# Patient Record
Sex: Female | Born: 1980 | Race: Black or African American | Hispanic: No | Marital: Single | Smoking: Former smoker
Health system: Southern US, Community
[De-identification: ages and names within clinical notes are randomized; demographics above are authoritative.]

## PROBLEM LIST (undated history)

## (undated) ENCOUNTER — Inpatient Hospital Stay: Payer: Self-pay

## (undated) DIAGNOSIS — R011 Cardiac murmur, unspecified: Secondary | ICD-10-CM

## (undated) DIAGNOSIS — R51 Headache: Secondary | ICD-10-CM

## (undated) DIAGNOSIS — R519 Headache, unspecified: Secondary | ICD-10-CM

## (undated) DIAGNOSIS — E042 Nontoxic multinodular goiter: Secondary | ICD-10-CM

## (undated) DIAGNOSIS — F329 Major depressive disorder, single episode, unspecified: Secondary | ICD-10-CM

## (undated) DIAGNOSIS — F32A Depression, unspecified: Secondary | ICD-10-CM

## (undated) DIAGNOSIS — E041 Nontoxic single thyroid nodule: Secondary | ICD-10-CM

## (undated) HISTORY — DX: Cardiac murmur, unspecified: R01.1

## (undated) HISTORY — DX: Depression, unspecified: F32.A

## (undated) HISTORY — DX: Major depressive disorder, single episode, unspecified: F32.9

## (undated) HISTORY — DX: Nontoxic single thyroid nodule: E04.1

---
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
## 2002-12-25 ENCOUNTER — Other Ambulatory Visit: Admission: RE | Admit: 2002-12-25 | Discharge: 2002-12-25 | Payer: Self-pay | Admitting: Obstetrics and Gynecology

## 2003-02-23 ENCOUNTER — Inpatient Hospital Stay (HOSPITAL_COMMUNITY): Admission: AD | Admit: 2003-02-23 | Discharge: 2003-02-24 | Payer: Self-pay | Admitting: Obstetrics and Gynecology

## 2003-03-21 ENCOUNTER — Observation Stay (HOSPITAL_COMMUNITY): Admission: AD | Admit: 2003-03-21 | Discharge: 2003-03-22 | Payer: Self-pay | Admitting: Obstetrics and Gynecology

## 2003-04-23 ENCOUNTER — Inpatient Hospital Stay (HOSPITAL_COMMUNITY): Admission: AD | Admit: 2003-04-23 | Discharge: 2003-04-23 | Payer: Self-pay | Admitting: Obstetrics and Gynecology

## 2003-05-21 ENCOUNTER — Inpatient Hospital Stay (HOSPITAL_COMMUNITY): Admission: AD | Admit: 2003-05-21 | Discharge: 2003-05-24 | Payer: Self-pay | Admitting: Obstetrics and Gynecology

## 2003-05-26 ENCOUNTER — Inpatient Hospital Stay (HOSPITAL_COMMUNITY): Admission: AD | Admit: 2003-05-26 | Discharge: 2003-05-31 | Payer: Self-pay | Admitting: Obstetrics and Gynecology

## 2003-12-27 ENCOUNTER — Other Ambulatory Visit: Admission: RE | Admit: 2003-12-27 | Discharge: 2003-12-27 | Payer: Self-pay | Admitting: Obstetrics and Gynecology

## 2005-03-03 ENCOUNTER — Other Ambulatory Visit: Admission: RE | Admit: 2005-03-03 | Discharge: 2005-03-03 | Payer: Self-pay | Admitting: Obstetrics and Gynecology

## 2005-10-06 ENCOUNTER — Emergency Department (HOSPITAL_COMMUNITY): Admission: EM | Admit: 2005-10-06 | Discharge: 2005-10-06 | Payer: Self-pay | Admitting: Emergency Medicine

## 2006-01-10 ENCOUNTER — Emergency Department (HOSPITAL_COMMUNITY): Admission: EM | Admit: 2006-01-10 | Discharge: 2006-01-11 | Payer: Self-pay | Admitting: Emergency Medicine

## 2009-07-06 ENCOUNTER — Emergency Department: Payer: Self-pay | Admitting: Unknown Physician Specialty

## 2010-04-22 ENCOUNTER — Emergency Department: Payer: Self-pay | Admitting: Emergency Medicine

## 2011-07-29 ENCOUNTER — Emergency Department: Payer: Self-pay | Admitting: Emergency Medicine

## 2011-07-29 LAB — COMPREHENSIVE METABOLIC PANEL
Albumin: 3.9 g/dL (ref 3.4–5.0)
BUN: 9 mg/dL (ref 7–18)
Bilirubin,Total: 0.4 mg/dL (ref 0.2–1.0)
Calcium, Total: 8.9 mg/dL (ref 8.5–10.1)
Co2: 26 mmol/L (ref 21–32)
EGFR (African American): >60
Glucose: 93 mg/dL (ref 65–99)
Potassium: 4 mmol/L (ref 3.5–5.1)
SGOT(AST): 17 U/L (ref 15–37)
SGPT (ALT): 21 U/L
Total Protein: 8.1 g/dL (ref 6.4–8.2)

## 2011-07-29 LAB — URINALYSIS, COMPLETE
Bilirubin,UR: NEGATIVE
Ketone: NEGATIVE
Leukocyte Esterase: NEGATIVE
Nitrite: NEGATIVE
Protein: NEGATIVE
Specific Gravity: 1.018 (ref 1.003–1.030)

## 2011-07-29 LAB — WET PREP, GENITAL

## 2011-07-29 LAB — CBC
HCT: 36.9 % (ref 35.0–47.0)
HGB: 11.7 g/dL — ABNORMAL LOW (ref 12.0–16.0)
RBC: 4.83 10*6/uL (ref 3.80–5.20)
RDW: 15.9 % — ABNORMAL HIGH (ref 11.5–14.5)

## 2012-01-04 ENCOUNTER — Other Ambulatory Visit (INDEPENDENT_AMBULATORY_CARE_PROVIDER_SITE_OTHER): Payer: Self-pay

## 2012-01-04 ENCOUNTER — Ambulatory Visit (INDEPENDENT_AMBULATORY_CARE_PROVIDER_SITE_OTHER): Payer: Self-pay | Admitting: Endocrinology

## 2012-01-04 ENCOUNTER — Encounter: Payer: Self-pay | Admitting: Endocrinology

## 2012-01-04 VITALS — BP 130/82 | HR 67 | Temp 98.5°F | Ht 63.0 in | Wt 151.0 lb

## 2012-01-04 DIAGNOSIS — E042 Nontoxic multinodular goiter: Secondary | ICD-10-CM

## 2012-01-04 NOTE — Patient Instructions (Addendum)
blood tests are being requested for you today.  You will receive a letter with results. If the blood test is overactive, i'll request for you a special x-ray to be done at [facility] [address] If it is not overactive, i'll request for you a biopsy, also at [facility]. (update: i ordered us-guided bx)
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
## 2012-01-05 ENCOUNTER — Encounter: Payer: Self-pay | Admitting: Endocrinology

## 2012-01-05 DIAGNOSIS — F329 Major depressive disorder, single episode, unspecified: Secondary | ICD-10-CM | POA: Insufficient documentation

## 2012-01-05 DIAGNOSIS — O4402 Placenta previa specified as without hemorrhage, second trimester: Secondary | ICD-10-CM | POA: Insufficient documentation

## 2013-05-28 ENCOUNTER — Other Ambulatory Visit: Payer: Self-pay | Admitting: Otolaryngology

## 2013-05-28 DIAGNOSIS — E041 Nontoxic single thyroid nodule: Secondary | ICD-10-CM

## 2013-06-06 ENCOUNTER — Ambulatory Visit
Admission: RE | Admit: 2013-06-06 | Discharge: 2013-06-06 | Disposition: A | Discharge status: Home / Self Care | Payer: BC Managed Care – PPO | Source: Ambulatory Visit | Attending: Otolaryngology | Admitting: Otolaryngology

## 2013-06-06 ENCOUNTER — Other Ambulatory Visit (HOSPITAL_COMMUNITY)
Admission: RE | Admit: 2013-06-06 | Discharge: 2013-06-06 | Disposition: A | Discharge status: Home / Self Care | Payer: BC Managed Care – PPO | Source: Ambulatory Visit | Attending: Interventional Radiology | Admitting: Interventional Radiology

## 2013-06-06 DIAGNOSIS — E041 Nontoxic single thyroid nodule: Secondary | ICD-10-CM

## 2013-11-13 DIAGNOSIS — E041 Nontoxic single thyroid nodule: Secondary | ICD-10-CM | POA: Insufficient documentation

## 2014-12-07 ENCOUNTER — Emergency Department
Admission: EM | Admit: 2014-12-07 | Discharge: 2014-12-07 | Disposition: A | Discharge status: Home / Self Care | Payer: 59 | Attending: Emergency Medicine | Admitting: Emergency Medicine

## 2014-12-07 ENCOUNTER — Emergency Department: Payer: 59

## 2014-12-07 DIAGNOSIS — Z720 Tobacco use: Secondary | ICD-10-CM | POA: Diagnosis not present

## 2014-12-07 DIAGNOSIS — Z3202 Encounter for pregnancy test, result negative: Secondary | ICD-10-CM | POA: Diagnosis not present

## 2014-12-07 DIAGNOSIS — R1032 Left lower quadrant pain: Secondary | ICD-10-CM

## 2014-12-07 DIAGNOSIS — R112 Nausea with vomiting, unspecified: Secondary | ICD-10-CM | POA: Diagnosis not present

## 2014-12-07 LAB — LIPASE, BLOOD: LIPASE: 14 U/L — AB (ref 22–51)

## 2014-12-07 LAB — URINALYSIS COMPLETE WITH MICROSCOPIC (ARMC ONLY)
BACTERIA UA: NONE SEEN
Bilirubin Urine: NEGATIVE
GLUCOSE, UA: NEGATIVE mg/dL
HGB URINE DIPSTICK: NEGATIVE
Ketones, ur: NEGATIVE mg/dL
Leukocytes, UA: NEGATIVE
NITRITE: NEGATIVE
PROTEIN: NEGATIVE mg/dL
SPECIFIC GRAVITY, URINE: 1.02 (ref 1.005–1.030)
pH: 6 (ref 5.0–8.0)

## 2014-12-07 LAB — COMPREHENSIVE METABOLIC PANEL
ALBUMIN: 4.3 g/dL (ref 3.5–5.0)
ALT: 13 U/L — ABNORMAL LOW (ref 14–54)
ANION GAP: 7 (ref 5–15)
AST: 21 U/L (ref 15–41)
Alkaline Phosphatase: 71 U/L (ref 38–126)
BUN: 8 mg/dL (ref 6–20)
CHLORIDE: 105 mmol/L (ref 101–111)
CO2: 26 mmol/L (ref 22–32)
Calcium: 8.9 mg/dL (ref 8.9–10.3)
Creatinine, Ser: 0.89 mg/dL (ref 0.44–1.00)
GFR calc Af Amer: >60 mL/min (ref >60)
GFR calc non Af Amer: >60 mL/min (ref >60)
GLUCOSE: 124 mg/dL — AB (ref 65–99)
POTASSIUM: 4 mmol/L (ref 3.5–5.1)
SODIUM: 138 mmol/L (ref 135–145)
TOTAL PROTEIN: 7.9 g/dL (ref 6.5–8.1)
Total Bilirubin: 0.6 mg/dL (ref 0.3–1.2)

## 2014-12-07 LAB — CBC
HEMATOCRIT: 40.2 % (ref 35.0–47.0)
HEMOGLOBIN: 12.5 g/dL (ref 12.0–16.0)
MCH: 23.5 pg — ABNORMAL LOW (ref 26.0–34.0)
MCHC: 31.2 g/dL — AB (ref 32.0–36.0)
MCV: 75.3 fL — AB (ref 80.0–100.0)
Platelets: 167 10*3/uL (ref 150–440)
RBC: 5.34 MIL/uL — ABNORMAL HIGH (ref 3.80–5.20)
RDW: 14.8 % — AB (ref 11.5–14.5)
WBC: 5.2 10*3/uL (ref 3.6–11.0)

## 2014-12-07 LAB — POCT PREGNANCY, URINE: PREG TEST UR: NEGATIVE

## 2014-12-07 MED ORDER — SODIUM CHLORIDE 0.9 % IV BOLUS (SEPSIS)
500.0000 mL | INTRAVENOUS | Status: AC
  Administered 2014-12-07: 500 mL via INTRAVENOUS

## 2014-12-07 MED ORDER — ONDANSETRON 4 MG PO TBDP
ORAL_TABLET | ORAL | Status: AC
  Filled 2014-12-07: qty 1

## 2014-12-07 MED ORDER — MORPHINE SULFATE (PF) 4 MG/ML IV SOLN
4.0000 mg | Freq: Once | INTRAVENOUS | Status: AC
  Administered 2014-12-07: 4 mg via INTRAVENOUS
  Filled 2014-12-07: qty 1

## 2014-12-07 MED ORDER — ONDANSETRON HCL 4 MG PO TABS: ORAL_TABLET | ORAL | Status: DC

## 2014-12-07 MED ORDER — IOHEXOL 240 MG/ML SOLN
25.0000 mL | Freq: Once | INTRAMUSCULAR | Status: AC | PRN
  Administered 2014-12-07: 25 mL via ORAL

## 2014-12-07 MED ORDER — ONDANSETRON HCL 4 MG/2ML IJ SOLN
4.0000 mg | INTRAMUSCULAR | Status: AC
  Administered 2014-12-07: 4 mg via INTRAVENOUS
  Filled 2014-12-07: qty 2

## 2014-12-07 MED ORDER — IOHEXOL 300 MG/ML  SOLN
100.0000 mL | Freq: Once | INTRAMUSCULAR | Status: AC | PRN
  Administered 2014-12-07: 100 mL via INTRAVENOUS

## 2014-12-07 MED ORDER — ONDANSETRON 4 MG PO TBDP
4.0000 mg | ORAL_TABLET | Freq: Once | ORAL | Status: AC | PRN
  Administered 2014-12-07: 4 mg via ORAL

## 2014-12-07 NOTE — ED Provider Notes (Signed)
[facility] Emergency Department Provider Note  ____________________________________________  Time seen: Approximately 5:36 PM  I have reviewed the triage vital signs and the nursing notes.   HISTORY  Chief Complaint Abdominal Pain    HPI Rhylie Stehr is a 34 y.o. female with no significant past medical history who presents with left lower quadrant pain that extends down into her left groin.  She describes the pain as acute in onset and has been present for 2 days, constant but waxing and waning, accompanied by severe vomiting today (at least 10 episodes).  She has not had any bowel movements today and does not remember passing any flatus.  She states that the pain is severe at its worst but is currently mild at this time.  She has had similar symptoms in the past but states that it is more mild than this. she denies fever/chills, chest pain, shortness of breath.  She has a Mirena IUD and has intermittent spotting and irregular times, but currently denies any vaginal pain, vaginal bleeding, or vaginal discharge.   Past Medical History  Diagnosis Date  . Thyroid nodule   . Heart murmur   . Depression     Patient Active Problem List   Diagnosis Date Noted  . Heart murmur   . Depression   . Multinodular goiter 01/04/2012    History reviewed. No pertinent past surgical history.  Current Outpatient Rx  Name  Route  Sig  Dispense  Refill  . ondansetron (ZOFRAN) 4 MG tablet      Take 1-2 tabs by mouth every 8 hours as needed for nausea/vomiting   30 tablet   0   . oxyCODONE-acetaminophen (PERCOCET) 7.5-325 MG per tablet   Oral   Take 1 tablet by mouth as needed.         . promethazine (PHENERGAN) 25 MG tablet   Oral   Take 25 mg by mouth as needed.           Allergies Review of patient's allergies indicates no known allergies.  Family History  Problem Relation Age of Onset  . Hypertension Other     Grandparents  . Diabetes Other      Grandparents    Social History Social History  Substance Use Topics  . Smoking status: Current Every Day Smoker -- 0.30 packs/day    Types: Cigarettes  . Smokeless tobacco: None  . Alcohol Use: Yes    Review of Systems Constitutional: No fever/chills Eyes: No visual changes. ENT: No sore throat. Cardiovascular: Denies chest pain. Respiratory: Denies shortness of breath. Gastrointestinal: left lower quadrant abdominal pain with severe nausea and vomiting.  No diarrhea.  No constipation. Genitourinary: Negative for dysuria. Musculoskeletal: Negative for back pain. Skin: Negative for rash. Neurological: Negative for headaches, focal weakness or numbness.  10-point ROS otherwise negative.  ____________________________________________   PHYSICAL EXAM:  VITAL SIGNS: ED Triage Vitals  Enc Vitals Group     BP 12/07/14 1324 160/83 mmHg     Pulse Rate 12/07/14 1324 71     Resp --      Temp 12/07/14 1324 97.7 F (36.5 C)     Temp Source 12/07/14 1324 Oral     SpO2 12/07/14 1324 99 %     Weight 12/07/14 1324 160 lb (72.576 kg)     Height 12/07/14 1324  (1.6 m)     Head Cir --      Peak Flow --      Pain Score 12/07/14 1324  10     Pain Loc --      Pain Edu? --      Excl. in GC? --     Constitutional: Alert and oriented. Well appearing and in no acute distress it appears mildly uncomfortable. Eyes: Conjunctivae are normal. PERRL. EOMI. Head: Atraumatic. Nose: No congestion/rhinnorhea. Mouth/Throat: Mucous membranes are moist.  Oropharynx non-erythematous. Neck: No stridor.   Cardiovascular: Normal rate, regular rhythm. Grossly normal heart sounds.  Good peripheral circulation. Respiratory: Normal respiratory effort.  No retractions. Lungs CTAB. Gastrointestinal: Soft with mild to moderate tenderness to palpation of the left lower quadrant. No distention. No abdominal bruits. No CVA tenderness. no McBurney's tenderness. No guarding or rebound Genitourinary:   DEFERRED AT THE PATIENT'S REQUEST Musculoskeletal: No lower extremity tenderness nor edema.  No joint effusions. Neurologic:  Normal speech and language. No gross focal neurologic deficits are appreciated.  Skin:  Skin is warm, dry and intact. No rash noted. Psychiatric: Mood and affect are normal. Speech and behavior are normal.  ____________________________________________   LABS (all labs ordered are listed, but only abnormal results are displayed)  Labs Reviewed  LIPASE, BLOOD - Abnormal; Notable for the following:    Lipase 14 (*)    All other components within normal limits  COMPREHENSIVE METABOLIC PANEL - Abnormal; Notable for the following:    Glucose, Bld 124 (*)    ALT 13 (*)    All other components within normal limits  CBC - Abnormal; Notable for the following:    RBC 5.34 (*)    MCV 75.3 (*)    MCH 23.5 (*)    MCHC 31.2 (*)    RDW 14.8 (*)    All other components within normal limits  URINALYSIS COMPLETEWITH MICROSCOPIC ([facility] ONLY) - Abnormal; Notable for the following:    Color, Urine YELLOW (*)    APPearance CLEAR (*)    Squamous Epithelial / LPF 0-5 (*)    All other components within normal limits  POC URINE PREG, ED  POCT PREGNANCY, URINE   ____________________________________________  EKG  Not indicated ____________________________________________  RADIOLOGY   Ct Abdomen Pelvis W Contrast  12/07/2014   CLINICAL DATA:  Left lower quadrant abdominal pain and left-sided pelvic pain over the past 2 days associated with chills, nausea and vomiting.  EXAM: CT ABDOMEN AND PELVIS WITH CONTRAST  TECHNIQUE: Multidetector CT imaging of the abdomen and pelvis was performed using the standard protocol following bolus administration of intravenous contrast.  CONTRAST:  OMNIPAQUE IOHEXOL 300 MG/ML IV. Oral contrast was also administered.  COMPARISON:  01/03/2013, 07/29/2011.  FINDINGS: Hepatobiliary: Liver normal in size and appearance. Normal-appearing  gallbladder without calcified gallstones. No biliary ductal dilation.  Spleen:  Normal in size and appearance.  Pancreas:  Normal in appearance.  No pancreatic ductal dilation.  Adrenal glands:  Normal in appearance.  Genitourinary: Both kidneys normal in size and appearance. No evidence of urinary tract calculi. Normal-appearing urinary bladder.  Normal-appearing uterus. Intrauterine device appropriately positioned in the fundal endometrium. Collapsing functional cyst in the right ovary with an enhancing wall measuring approximately 2 cm. No dominant cyst in either ovary. Minimal free fluid in the right adnexum. No free fluid elsewhere in the pelvis.  Gastrointestinal: Normal appearing stomach. Apparent wall thickening involving the gastric antrum likely represents peristalsis/muscle contraction, as there is no evidence of gastric distention to suggest outflow obstruction. Normal-appearing small bowel. Normal appearing colon with expected stool burden. Normal appearing gas-filled appendix in the right mid pelvis.  Ascites:  Absent.  Vascular: No visible aortoiliofemoral atherosclerosis. Retroaortic left renal vein.  Lymphatic:  No pathologic lymphadenopathy in the abdomen or pelvis.  Other findings: None.  Musculoskeletal: Regional skeleton intact.  Visualized lower thorax: Heart size normal.  Lung bases clear.  IMPRESSION: 1. 2 cm collapsing functional cyst in the right ovary with minimal free fluid in the right adnexum. 2. Otherwise, no acute or significant abnormalities involving the abdomen or pelvis.   Electronically Signed   By: Hulan Saas M.D.   On: 12/07/2014 18:44    ____________________________________________   PROCEDURES  Procedure(s) performed: None  Critical Care performed: No ____________________________________________   INITIAL IMPRESSION / ASSESSMENT AND PLAN / ED COURSE  Pertinent labs & imaging results that were available during my care of the patient were reviewed by me  and considered in my medical decision making (see chart for details).  The patient is relatively well appearing and has normal vital signs and essentially normal labs.  She does have severe tenderness to palpation of the left lower quadrant and I am concerned about the numerous episodes of vomiting and her report of not passing flatus or having a bowel movement as well.  Given my concern for her vomiting and tenderness to palpation, I will evaluate with a CT scan with by mouth and IV contrast.  I will likely also need for a pelvic exam  but will defer first to the imaging.  ----------------------------------------- 8:03 PM on 12/07/2014 -----------------------------------------  The patient's CT scan was unremarkable.  I just went to reassess her and she states that she feels much better, is hungry, just wants to go home.  I explained that we normally would do a pelvic exam, but she states that she does not want to have one today and that she feels fine and is not having any symptoms "down there".  I understand her preference and think this is acceptable at this time.he also told me that she does have this pain in the past when she is "feeling stressed" in her grandmother's in the hospital right now she thinks this may have something to do with it.  She has been followed prescribe her some anxiety medication but I deferred and recommended that she follow-up with her regular doctor.  I advocated the use of over-the-counter pain medicine at this time.  I gave her my usual and customary return precautions.  ____________________________________________  FINAL CLINICAL IMPRESSION(S) / ED DIAGNOSES  Final diagnoses:  LLQ abdominal pain      NEW MEDICATIONS STARTED DURING THIS VISIT:  New Prescriptions   ONDANSETRON (ZOFRAN) 4 MG TABLET    Take 1-2 tabs by mouth every 8 hours as needed for nausea/vomiting     Loleta Rose, MD 12/07/14 2006

## 2014-12-07 NOTE — ED Notes (Signed)
Pt c/o LLQ pain/pelvic pain for the past 2 days with N/V.Marland Kitchendenies diarrhea or discharge.Marland Kitchen

## 2014-12-07 NOTE — Discharge Instructions (Signed)
You have been seen in the Emergency Department (ED) for abdominal pain.  Your evaluation did not identify a clear cause of your symptoms but was generally reassuring.  Please follow up as instructed above regarding todays emergent visit and the symptoms that are bothering you.  Return to the ED if your abdominal pain worsens or fails to improve, you develop bloody vomiting, bloody diarrhea, you are unable to tolerate fluids due to vomiting, fever greater than 101, or other symptoms that concern you.   Abdominal Pain Many things can cause abdominal pain. Usually, abdominal pain is not caused by a disease and will improve without treatment. It can often be observed and treated at home. Your health care provider will do a physical exam and possibly order blood tests and X-rays to help determine the seriousness of your pain. However, in many cases, more time must pass before a clear cause of the pain can be found. Before that point, your health care provider may not know if you need more testing or further treatment. HOME CARE INSTRUCTIONS  Monitor your abdominal pain for any changes. The following actions may help to alleviate any discomfort you are experiencing:  Only take over-the-counter or prescription medicines as directed by your health care provider.  Do not take laxatives unless directed to do so by your health care provider.  Try a clear liquid diet (broth, tea, or water) as directed by your health care provider. Slowly move to a bland diet as tolerated. SEEK MEDICAL CARE IF:  You have unexplained abdominal pain.  You have abdominal pain associated with nausea or diarrhea.  You have pain when you urinate or have a bowel movement.  You experience abdominal pain that wakes you in the night.  You have abdominal pain that is worsened or improved by eating food.  You have abdominal pain that is worsened with eating fatty foods.  You have a fever. SEEK IMMEDIATE MEDICAL CARE IF:    Your pain does not go away within 2 hours.  You keep throwing up (vomiting).  Your pain is felt only in portions of the abdomen, such as the right side or the left lower portion of the abdomen.  You pass bloody or black tarry stools. MAKE SURE YOU:  Understand these instructions.   Will watch your condition.   Will get help right away if you are not doing well or get worse.  Document Released: 01/13/2005 Document Revised: 04/10/2013 Document Reviewed: 12/13/2012 [facility] Patient Information 2015 [place], [place]. This information is not intended to replace advice given to you by your health care provider. Make sure you discuss any questions you have with your health care provider.  Abdominal Pain, Women Abdominal (stomach, pelvic, or belly) pain can be caused by many things. It is important to tell your doctor:  The location of the pain.  Does it come and go or is it present all the time?  Are there things that start the pain (eating certain foods, exercise)?  Are there other symptoms associated with the pain (fever, nausea, vomiting, diarrhea)? All of this is helpful to know when trying to find the cause of the pain. CAUSES   Stomach: virus or bacteria infection, or ulcer.  Intestine: appendicitis (inflamed appendix), regional ileitis (Crohn's disease), ulcerative colitis (inflamed colon), irritable bowel syndrome, diverticulitis (inflamed diverticulum of the colon), or cancer of the stomach or intestine.  Gallbladder disease or stones in the gallbladder.  Kidney disease, kidney stones, or infection.  Pancreas infection or cancer.  Fibromyalgia (pain disorder).  Diseases of the female organs:  Uterus: fibroid (non-cancerous) tumors or infection.  Fallopian tubes: infection or tubal pregnancy.  Ovary: cysts or tumors.  Pelvic adhesions (scar tissue).  Endometriosis (uterus lining tissue growing in the pelvis and on the pelvic organs).  Pelvic congestion  syndrome (female organs filling up with blood just before the menstrual period).  Pain with the menstrual period.  Pain with ovulation (producing an egg).  Pain with an IUD (intrauterine device, birth control) in the uterus.  Cancer of the female organs.  Functional pain (pain not caused by a disease, may improve without treatment).  Psychological pain.  Depression. DIAGNOSIS  Your doctor will decide the seriousness of your pain by doing an examination.  Blood tests.  X-rays.  Ultrasound.  CT scan (computed tomography, special type of X-ray).  MRI (magnetic resonance imaging).  Cultures, for infection.  Barium enema (dye inserted in the large intestine, to better view it with X-rays).  Colonoscopy (looking in intestine with a lighted tube).  Laparoscopy (minor surgery, looking in abdomen with a lighted tube).  Major abdominal exploratory surgery (looking in abdomen with a large incision). TREATMENT  The treatment will depend on the cause of the pain.   Many cases can be observed and treated at home.  Over-the-counter medicines recommended by your caregiver.  Prescription medicine.  Antibiotics, for infection.  Birth control pills, for painful periods or for ovulation pain.  Hormone treatment, for endometriosis.  Nerve blocking injections.  Physical therapy.  Antidepressants.  Counseling with a psychologist or psychiatrist.  Minor or major surgery. HOME CARE INSTRUCTIONS   Do not take laxatives, unless directed by your caregiver.  Take over-the-counter pain medicine only if ordered by your caregiver. Do not take aspirin because it can cause an upset stomach or bleeding.  Try a clear liquid diet (broth or water) as ordered by your caregiver. Slowly move to a bland diet, as tolerated, if the pain is related to the stomach or intestine.  Have a thermometer and take your temperature several times a day, and record it.  Bed rest and sleep, if it helps  the pain.  Avoid sexual intercourse, if it causes pain.  Avoid stressful situations.  Keep your follow-up appointments and tests, as your caregiver orders.  If the pain does not go away with medicine or surgery, you may try:  Acupuncture.  Relaxation exercises (yoga, meditation).  Group therapy.  Counseling. SEEK MEDICAL CARE IF:   You notice certain foods cause stomach pain.  Your home care treatment is not helping your pain.  You need stronger pain medicine.  You want your IUD removed.  You feel faint or lightheaded.  You develop nausea and vomiting.  You develop a rash.  You are having side effects or an allergy to your medicine. SEEK IMMEDIATE MEDICAL CARE IF:   Your pain does not go away or gets worse.  You have a fever.  Your pain is felt only in portions of the abdomen. The right side could possibly be appendicitis. The left lower portion of the abdomen could be colitis or diverticulitis.  You are passing blood in your stools (bright red or black tarry stools, with or without vomiting).  You have blood in your urine.  You develop chills, with or without a fever.  You pass out. MAKE SURE YOU:   Understand these instructions.  Will watch your condition.  Will get help right away if you are not doing well or get worse.  Document Released: 01/31/2007 Document Revised: 08/20/2013 Document Reviewed: 02/20/2009 [facility] Patient Information 2015 [place], [place]. This information is not intended to replace advice given to you by your health care provider. Make sure you discuss any questions you have with your health care provider.

## 2017-07-07 ENCOUNTER — Emergency Department
Admission: EM | Admit: 2017-07-07 | Discharge: 2017-07-07 | Disposition: A | Discharge status: Home / Self Care | Payer: Medicaid Other | Attending: Emergency Medicine | Admitting: Emergency Medicine

## 2017-07-07 ENCOUNTER — Encounter: Payer: Self-pay | Admitting: Emergency Medicine

## 2017-07-07 DIAGNOSIS — O99331 Smoking (tobacco) complicating pregnancy, first trimester: Secondary | ICD-10-CM | POA: Insufficient documentation

## 2017-07-07 DIAGNOSIS — O219 Vomiting of pregnancy, unspecified: Secondary | ICD-10-CM | POA: Insufficient documentation

## 2017-07-07 DIAGNOSIS — F17210 Nicotine dependence, cigarettes, uncomplicated: Secondary | ICD-10-CM | POA: Insufficient documentation

## 2017-07-07 DIAGNOSIS — O9989 Other specified diseases and conditions complicating pregnancy, childbirth and the puerperium: Secondary | ICD-10-CM | POA: Diagnosis not present

## 2017-07-07 DIAGNOSIS — Z3A10 10 weeks gestation of pregnancy: Secondary | ICD-10-CM | POA: Insufficient documentation

## 2017-07-07 DIAGNOSIS — M791 Myalgia, unspecified site: Secondary | ICD-10-CM | POA: Diagnosis not present

## 2017-07-07 LAB — URINALYSIS, COMPLETE (UACMP) WITH MICROSCOPIC
BILIRUBIN URINE: NEGATIVE
Bacteria, UA: NONE SEEN
GLUCOSE, UA: NEGATIVE mg/dL
HGB URINE DIPSTICK: NEGATIVE
Ketones, ur: NEGATIVE mg/dL
NITRITE: NEGATIVE
PH: 6 (ref 5.0–8.0)
Protein, ur: NEGATIVE mg/dL
SPECIFIC GRAVITY, URINE: 1.006 (ref 1.005–1.030)

## 2017-07-07 LAB — COMPREHENSIVE METABOLIC PANEL
ALT: 38 U/L (ref 14–54)
ANION GAP: 9 (ref 5–15)
AST: 40 U/L (ref 15–41)
Albumin: 3.7 g/dL (ref 3.5–5.0)
Alkaline Phosphatase: 63 U/L (ref 38–126)
BUN: 6 mg/dL (ref 6–20)
CO2: 23 mmol/L (ref 22–32)
CREATININE: 0.8 mg/dL (ref 0.44–1.00)
Calcium: 8.9 mg/dL (ref 8.9–10.3)
Chloride: 101 mmol/L (ref 101–111)
GFR calc non Af Amer: >60 mL/min (ref >60)
Glucose, Bld: 162 mg/dL — ABNORMAL HIGH (ref 65–99)
Potassium: 3.4 mmol/L — ABNORMAL LOW (ref 3.5–5.1)
SODIUM: 133 mmol/L — AB (ref 135–145)
Total Bilirubin: 0.4 mg/dL (ref 0.3–1.2)
Total Protein: 7.2 g/dL (ref 6.5–8.1)

## 2017-07-07 LAB — CBC
HCT: 33.5 % — ABNORMAL LOW (ref 35.0–47.0)
HEMOGLOBIN: 10.6 g/dL — AB (ref 12.0–16.0)
MCH: 23.8 pg — AB (ref 26.0–34.0)
MCHC: 31.7 g/dL — ABNORMAL LOW (ref 32.0–36.0)
MCV: 75.1 fL — AB (ref 80.0–100.0)
Platelets: 193 10*3/uL (ref 150–440)
RBC: 4.46 MIL/uL (ref 3.80–5.20)
RDW: 15.3 % — ABNORMAL HIGH (ref 11.5–14.5)
WBC: 5.7 10*3/uL (ref 3.6–11.0)

## 2017-07-07 LAB — HCG, QUANTITATIVE, PREGNANCY: hCG, Beta Chain, Quant, S: 102841 m[IU]/mL — ABNORMAL HIGH (ref <5)

## 2017-07-07 LAB — LIPASE, BLOOD: LIPASE: 28 U/L (ref 11–51)

## 2017-07-07 MED ORDER — ONDANSETRON HCL 4 MG PO TABS: 4.0000 mg | ORAL_TABLET | Freq: Every day | ORAL | 1 refills | Status: DC | PRN

## 2017-07-07 MED ORDER — ONDANSETRON 4 MG PO TBDP
4.0000 mg | ORAL_TABLET | Freq: Once | ORAL | Status: AC
  Administered 2017-07-07: 4 mg via ORAL
  Filled 2017-07-07: qty 1

## 2017-07-07 MED ORDER — PROMETHAZINE HCL 25 MG RE SUPP: 25.0000 mg | Freq: Four times a day (QID) | RECTAL | 1 refills | Status: DC | PRN

## 2017-07-07 MED ORDER — METOCLOPRAMIDE HCL 10 MG PO TABS
10.0000 mg | ORAL_TABLET | Freq: Once | ORAL | Status: AC
  Administered 2017-07-07: 10 mg via ORAL
  Filled 2017-07-07: qty 1

## 2017-07-07 NOTE — ED Notes (Signed)
ED Provider at bedside. 

## 2017-07-07 NOTE — ED Notes (Signed)
Patient c/o nausea and dry heaving beginning Monday after influenza vaccination. Patient is approximately 10-[redacted] weeks pregnant. Patient denies vaginal bleeding. Patient denies pain.

## 2017-07-07 NOTE — ED Provider Notes (Signed)
[facility] Emergency Department Provider Note       Time seen: ----------------------------------------- 7:14 PM on 07/07/2017 -----------------------------------------   I have reviewed the triage vital signs and the nursing notes.  HISTORY   Chief Complaint Nausea    HPI [name] Moreno is a 37 y.o. female with a history of depression, who presents to the ED for nausea and dry heaving.  Patient states she is around [redacted] weeks pregnant.  Patient's been having uncontrollable nausea and vomiting at home she states she denies any vaginal bleeding or abdominal pain.  She states nausea and body aches started on Monday after receiving a flu vaccine on the prior Friday.  She is G3 P1 Ab1.  Past Medical History:  Diagnosis Date  . Depression   . Heart murmur   . Thyroid nodule     Patient Active Problem List   Diagnosis Date Noted  . Heart murmur   . Depression   . Multinodular goiter 01/04/2012    History reviewed. No pertinent surgical history.  Allergies Patient has no known allergies.  Social History Social History   Tobacco Use  . Smoking status: Current Every Day Smoker    Packs/day: 0.30    Types: Cigarettes  . Smokeless tobacco: Never Used  Substance Use Topics  . Alcohol use: Yes  . Drug use: No   Review of Systems Constitutional: Negative for fever. Cardiovascular: Negative for chest pain. Respiratory: Negative for shortness of breath. Gastrointestinal: Negative for abdominal pain, positive for nausea and vomiting Genitourinary: Negative for dysuria. Musculoskeletal: Negative for back pain.  Positive for myalgias Skin: Negative for rash. Neurological: Negative for headaches, focal weakness or numbness.  All systems negative/normal/unremarkable except as stated in the HPI  ____________________________________________   PHYSICAL EXAM:  VITAL SIGNS: ED Triage Vitals  Enc Vitals Group     BP 07/07/17 1837 119/62     Pulse  Rate 07/07/17 1837 66     Resp 07/07/17 1837 16     Temp 07/07/17 1837 98.6 F (37 C)     Temp Source 07/07/17 1837 Oral     SpO2 07/07/17 1837 100 %     Weight 07/07/17 1838 158 lb (71.7 kg)     Height 07/07/17 1838 5\' 3"  (1.6 m)     Head Circumference --      Peak Flow --      Pain Score 07/07/17 1837 9     Pain Loc --      Pain Edu? --      Excl. in GC? --    Constitutional: Alert and oriented. Well appearing and in no distress. Eyes: Conjunctivae are normal. Normal extraocular movements. ENT   Head: Normocephalic and atraumatic.   Nose: No congestion/rhinnorhea.   Mouth/Throat: Mucous membranes are moist.   Neck: No stridor. Cardiovascular: Normal rate, regular rhythm. No murmurs, rubs, or gallops. Respiratory: Normal respiratory effort without tachypnea nor retractions. Breath sounds are clear and equal bilaterally. No wheezes/rales/rhonchi. Gastrointestinal: Soft and nontender. Normal bowel sounds Musculoskeletal: Nontender with normal range of motion in extremities. No lower extremity tenderness nor edema. Neurologic:  Normal speech and language. No gross focal neurologic deficits are appreciated.  Skin:  Skin is warm, dry and intact. No rash noted. Psychiatric: Mood and affect are normal. Speech and behavior are normal.  ____________________________________________  ED COURSE:  As part of my medical decision making, I reviewed the following data within the electronic MEDICAL RECORD NUMBER History obtained from family if available, nursing notes,  old chart and ekg, as well as notes from prior ED visits. Patient presented for nausea and vomiting in pregnancy, we will assess with labs and give antiemetics.   Procedures ____________________________________________   LABS (pertinent positives/negatives)  Labs Reviewed  COMPREHENSIVE METABOLIC PANEL - Abnormal; Notable for the following components:      Result Value   Sodium 133 (*)    Potassium 3.4 (*)     Glucose, Bld 162 (*)    All other components within normal limits  CBC - Abnormal; Notable for the following components:   Hemoglobin 10.6 (*)    HCT 33.5 (*)    MCV 75.1 (*)    MCH 23.8 (*)    MCHC 31.7 (*)    RDW 15.3 (*)    All other components within normal limits  URINALYSIS, COMPLETE (UACMP) WITH MICROSCOPIC - Abnormal; Notable for the following components:   Color, Urine YELLOW (*)    APPearance HAZY (*)    Leukocytes, UA MODERATE (*)    Squamous Epithelial / LPF 6-30 (*)    All other components within normal limits  LIPASE, BLOOD  HCG, QUANTITATIVE, PREGNANCY   ___________________________________________  DIFFERENTIAL DIAGNOSIS   Hyperemesis, dehydration, electrolyte abnormality  FINAL ASSESSMENT AND PLAN  Vomiting in pregnancy   Plan: The patient had presented for nausea and vomiting in pregnancy. Patient's labs are grossly unremarkable.  She was given Zofran here and is stable for outpatient follow-up.   Ulice DashJohnathan E Williams, MD   Note: This note was generated in part or whole with voice recognition software. Voice recognition is usually quite accurate but there are transcription errors that can and very often do occur. I apologize for any typographical errors that were not detected and corrected.     Emily FilbertWilliams, Jonathan E, MD 07/07/17 Jerene Bears1920

## 2017-07-07 NOTE — ED Triage Notes (Signed)
Pt comes into the ED via POV c/o [redacted] weeks pregnant (esitimate) and patient is having uncontrolled nausea and vomiting.  Denies any vaginal bleeding and only having mild abdominal cramping.  Patient in NAD at this time with even and unlabored respirations. The nausea just started Monday after she had the flu vaccine on the Friday prior.

## 2017-07-11 DIAGNOSIS — Z315 Encounter for genetic counseling: Secondary | ICD-10-CM | POA: Insufficient documentation

## 2017-07-20 ENCOUNTER — Emergency Department
Admission: EM | Admit: 2017-07-20 | Discharge: 2017-07-20 | Disposition: A | Discharge status: Home / Self Care | Payer: Medicaid Other | Attending: Emergency Medicine | Admitting: Emergency Medicine

## 2017-07-20 ENCOUNTER — Encounter: Payer: Self-pay | Admitting: Emergency Medicine

## 2017-07-20 DIAGNOSIS — Z3A09 9 weeks gestation of pregnancy: Secondary | ICD-10-CM | POA: Diagnosis not present

## 2017-07-20 DIAGNOSIS — O219 Vomiting of pregnancy, unspecified: Secondary | ICD-10-CM | POA: Diagnosis not present

## 2017-07-20 DIAGNOSIS — F17210 Nicotine dependence, cigarettes, uncomplicated: Secondary | ICD-10-CM | POA: Insufficient documentation

## 2017-07-20 LAB — COMPREHENSIVE METABOLIC PANEL
ALBUMIN: 3.9 g/dL (ref 3.5–5.0)
ALK PHOS: 66 U/L (ref 38–126)
ALT: 25 U/L (ref 14–54)
ANION GAP: 5 (ref 5–15)
AST: 21 U/L (ref 15–41)
BILIRUBIN TOTAL: 0.4 mg/dL (ref 0.3–1.2)
BUN: 10 mg/dL (ref 6–20)
CALCIUM: 8.8 mg/dL — AB (ref 8.9–10.3)
CO2: 25 mmol/L (ref 22–32)
Chloride: 105 mmol/L (ref 101–111)
Creatinine, Ser: 0.79 mg/dL (ref 0.44–1.00)
GFR calc Af Amer: >60 mL/min (ref >60)
GLUCOSE: 98 mg/dL (ref 65–99)
Potassium: 3.8 mmol/L (ref 3.5–5.1)
Sodium: 135 mmol/L (ref 135–145)
TOTAL PROTEIN: 7.7 g/dL (ref 6.5–8.1)

## 2017-07-20 LAB — CBC WITH DIFFERENTIAL/PLATELET
BASOS PCT: 0 %
Basophils Absolute: 0 10*3/uL (ref 0–0.1)
Eosinophils Absolute: 0 10*3/uL (ref 0–0.7)
Eosinophils Relative: 1 %
HEMATOCRIT: 34.4 % — AB (ref 35.0–47.0)
HEMOGLOBIN: 11 g/dL — AB (ref 12.0–16.0)
LYMPHS ABS: 1.7 10*3/uL (ref 1.0–3.6)
LYMPHS PCT: 26 %
MCH: 24.1 pg — AB (ref 26.0–34.0)
MCHC: 32.1 g/dL (ref 32.0–36.0)
MCV: 75 fL — AB (ref 80.0–100.0)
MONOS PCT: 11 %
Monocytes Absolute: 0.7 10*3/uL (ref 0.2–0.9)
NEUTROS ABS: 4 10*3/uL (ref 1.4–6.5)
NEUTROS PCT: 62 %
Platelets: 174 10*3/uL (ref 150–440)
RBC: 4.58 MIL/uL (ref 3.80–5.20)
RDW: 15.2 % — ABNORMAL HIGH (ref 11.5–14.5)
WBC: 6.4 10*3/uL (ref 3.6–11.0)

## 2017-07-20 LAB — URINALYSIS, COMPLETE (UACMP) WITH MICROSCOPIC
Bilirubin Urine: NEGATIVE
Glucose, UA: NEGATIVE mg/dL
HGB URINE DIPSTICK: NEGATIVE
Ketones, ur: NEGATIVE mg/dL
NITRITE: NEGATIVE
Protein, ur: NEGATIVE mg/dL
SPECIFIC GRAVITY, URINE: 1.027 (ref 1.005–1.030)
pH: 6 (ref 5.0–8.0)

## 2017-07-20 LAB — LIPASE, BLOOD: Lipase: 28 U/L (ref 11–51)

## 2017-07-20 MED ORDER — VITAMIN B-6 50 MG PO TABS
25.0000 mg | ORAL_TABLET | Freq: Once | ORAL | Status: AC
  Administered 2017-07-20: 25 mg via ORAL
  Filled 2017-07-20: qty 0.5

## 2017-07-20 MED ORDER — DOXYLAMINE-PYRIDOXINE 10-10 MG PO TBEC: 2.0000 | DELAYED_RELEASE_TABLET | Freq: Two times a day (BID) | ORAL | 1 refills | Status: DC

## 2017-07-20 MED ORDER — METOCLOPRAMIDE HCL 10 MG PO TABS: 10.0000 mg | ORAL_TABLET | Freq: Three times a day (TID) | ORAL | 0 refills | Status: DC | PRN

## 2017-07-20 MED ORDER — METOCLOPRAMIDE HCL 5 MG/ML IJ SOLN
10.0000 mg | Freq: Once | INTRAMUSCULAR | Status: AC
  Administered 2017-07-20: 10 mg via INTRAVENOUS
  Filled 2017-07-20: qty 2

## 2017-07-20 MED ORDER — SODIUM CHLORIDE 0.9 % IV BOLUS
1000.0000 mL | Freq: Once | INTRAVENOUS | Status: AC
  Administered 2017-07-20: 1000 mL via INTRAVENOUS

## 2017-07-20 MED ORDER — DOXYLAMINE SUCCINATE (SLEEP) 25 MG PO TABS
25.0000 mg | ORAL_TABLET | Freq: Once | ORAL | Status: AC
  Administered 2017-07-20: 25 mg via ORAL
  Filled 2017-07-20: qty 1

## 2017-07-20 NOTE — ED Triage Notes (Signed)
Pt arrived with complaints of persistent nausea and vomiting. Pt is [redacted] weeks pregnant and states she was seen here 2 weeks ago when she was prescribed Zofran and phenergan. Pt denies any relief with medications.

## 2017-07-20 NOTE — ED Provider Notes (Signed)
[facility] Emergency Department Provider Note  ____________________________________________  Time seen: Approximately 5:08 PM  I have reviewed the triage vital signs and the nursing notes.   HISTORY  Chief Complaint Emesis During Pregnancy   HPI [name] Moreno is a 37 y.o. female G3P1 currently at 849 weeks GA who presents for evaluation of nausea and vomiting. Patient reports that her symptoms have been constant for several weeks since she found out she was pregnant. She has zofran and phenergan at home but reports no relief with either. She reports that she is unable to tolerate PO and will vomit every time she tries to eat or drink something.she denies abdominal pain, dysuria, hematuria, fever, chills, vaginal bleeding or discharge. Patient had an ultrasound confirming pregnancy 5 days ago which shows a normal IUP. She denies constipation or diarrhea. She reports that her symptoms are constant and severe. She reports having similar problems with her first pregnancy which required several different admissions to the hospital for IV hydration.  Past Medical History:  Diagnosis Date  . Depression   . Heart murmur   . Thyroid nodule     Patient Active Problem List   Diagnosis Date Noted  . Heart murmur   . Depression   . Multinodular goiter 01/04/2012    History reviewed. No pertinent surgical history.  Prior to Admission medications   Medication Sig Start Date End Date Taking? Authorizing Provider  Doxylamine-Pyridoxine 10-10 MG TBEC Take 2 tablets by mouth 2 (two) times daily. 07/20/17   Nita SickleVeronese, [place], MD  metoCLOPramide (REGLAN) 10 MG tablet Take 1 tablet (10 mg total) by mouth every 8 (eight) hours as needed for up to 3 days for nausea. 07/20/17 07/23/17  Nita SickleVeronese, , MD  ondansetron [facility](ZOFRAN) 4 MG tablet Take 1-2 tabs by mouth every 8 hours as needed for nausea/vomiting 12/07/14   Loleta RoseForbach, [place], MD  ondansetron [facility](ZOFRAN) 4 MG tablet Take 1  tablet (4 mg total) by mouth daily as needed for nausea or vomiting. 07/07/17   Emily FilbertWilliams, Jonathan E, MD  oxyCODONE-acetaminophen (PERCOCET) 7.5-325 MG per tablet Take 1 tablet by mouth as needed.    [provider]  promethazine (PHENERGAN) 25 MG suppository Place 1 suppository (25 mg total) rectally every 6 (six) hours as needed for nausea. 07/07/17 07/07/18  Emily FilbertWilliams, Jonathan E, MD  promethazine (PHENERGAN) 25 MG tablet Take 25 mg by mouth as needed.    [provider]    Allergies Patient has no known allergies.  Family History  Problem Relation Age of Onset  . Hypertension Other        Grandparents  . Diabetes Other        Grandparents    Social History Social History   Tobacco Use  . Smoking status: Current Every Day Smoker    Packs/day: 0.30    Types: Cigarettes  . Smokeless tobacco: Never Used  Substance Use Topics  . Alcohol use: Yes  . Drug use: No    Review of Systems  Constitutional: Negative for fever. Eyes: Negative for visual changes. ENT: Negative for sore throat. Neck: No neck pain  Cardiovascular: Negative for chest pain. Respiratory: Negative for shortness of breath. Gastrointestinal: Negative for abdominal pain or diarrhea. + nausea and vomiting Genitourinary: Negative for dysuria. Musculoskeletal: Negative for back pain. Skin: Negative for rash. Neurological: Negative for headaches, weakness or numbness. Psych: No SI or HI  ____________________________________________   PHYSICAL EXAM:  VITAL SIGNS: ED Triage Vitals [07/20/17 1635]  Enc Vitals Group  BP (!) 118/59     Pulse Rate 83     Resp 18     Temp 98.3 F (36.8 C)     Temp Source Oral     SpO2 100 %     Weight 158 lb (71.7 kg)     Height 5\' 3"  (1.6 m)     Head Circumference      Peak Flow      Pain Score 10     Pain Loc      Pain Edu?      Excl. in GC?     Constitutional: Alert and oriented. Well appearing and in no apparent distress. HEENT:      Head:  Normocephalic and atraumatic.         Eyes: Conjunctivae are normal. Sclera is non-icteric.       Mouth/Throat: Mucous membranes are moist.       Neck: Supple with no signs of meningismus. Cardiovascular: Regular rate and rhythm. No murmurs, gallops, or rubs. 2+ symmetrical distal pulses are present in all extremities. No JVD. Respiratory: Normal respiratory effort. Lungs are clear to auscultation bilaterally. No wheezes, crackles, or rhonchi.  Gastrointestinal: Soft, non tender, and non distended with positive bowel sounds. No rebound or guarding. Musculoskeletal: Nontender with normal range of motion in all extremities. No edema, cyanosis, or erythema of extremities. Neurologic: Normal speech and language. Face is symmetric. Moving all extremities. No gross focal neurologic deficits are appreciated. Skin: Skin is warm, dry and intact. No rash noted. Psychiatric: Mood and affect are normal. Speech and behavior are normal.  ____________________________________________   LABS (all labs ordered are listed, but only abnormal results are displayed)  Labs Reviewed  CBC WITH DIFFERENTIAL/PLATELET - Abnormal; Notable for the following components:      Result Value   Hemoglobin 11.0 (*)    HCT 34.4 (*)    MCV 75.0 (*)    MCH 24.1 (*)    RDW 15.2 (*)    All other components within normal limits  COMPREHENSIVE METABOLIC PANEL - Abnormal; Notable for the following components:   Calcium 8.8 (*)    All other components within normal limits  URINALYSIS, COMPLETE (UACMP) WITH MICROSCOPIC - Abnormal; Notable for the following components:   Color, Urine YELLOW (*)    APPearance HAZY (*)    Leukocytes, UA TRACE (*)    Bacteria, UA RARE (*)    Squamous Epithelial / LPF 6-30 (*)    All other components within normal limits  URINE CULTURE  LIPASE, BLOOD   ____________________________________________  EKG  none ____________________________________________  RADIOLOGY  none    ____________________________________________   PROCEDURES  Procedure(s) performed: None Procedures Critical Care performed:  None ____________________________________________   INITIAL IMPRESSION / ASSESSMENT AND PLAN / ED COURSE  37 y.o. female G3P1 currently at [redacted] weeks GA who presents for evaluation of nausea and vomiting for several weeks. Patient with history of severe nausea and vomiting and prior pregnancies. she looks well-hydrated, in no distress, with normal vital signs. We'll check labs for any evidence of severe dehydration, acute kidney injury, electrolyte abnormalities. We'll check urine to rule out UTI or to evaluate for the presence of ketones to allow diagnosis of hyperemesis. We'll give IV fluids and Reglan.    _________________________ 7:09 PM on 07/20/2017 -----------------------------------------  labsshow a normal white count, stable hemoglobin, normal electrolytes and kidney function, no ketones or UTI. No evidence of hyperemesis. Patient tolerating by mouth and feels markedly improved. Bedside ultrasound showing an  IUP with fetal heart rate of 162. Patients can be given a prescription for doxylamine and pyridoxine BID and Reglan when necessary. recommend follow-up with OB/GYN. Discussed return precautions with patient.   As part of my medical decision making, I reviewed the following data within the electronic MEDICAL RECORD NUMBER Nursing notes reviewed and incorporated, Labs reviewed , Old chart reviewed, Notes from prior ED visits and [place] Controlled Substance Database    Pertinent labs & imaging results that were available during my care of the patient were reviewed by me and considered in my medical decision making (see chart for details).    ____________________________________________   FINAL CLINICAL IMPRESSION(S) / ED DIAGNOSES  Final diagnoses:  Nausea and vomiting in pregnancy      NEW MEDICATIONS STARTED DURING THIS VISIT:  ED Discharge Orders         Ordered    Doxylamine-Pyridoxine 10-10 MG TBEC  2 times daily     07/20/17 1908    metoCLOPramide (REGLAN) 10 MG tablet  Every 8 hours PRN     07/20/17 1908       Note:  This document was prepared using Dragon voice recognition software and may include unintentional dictation errors.    Don Perking, [place], MD 07/20/17 1910

## 2017-07-20 NOTE — ED Notes (Signed)
Pt given ginger ale per her request

## 2017-07-20 NOTE — ED Notes (Signed)
Signature pad not working - pt verbalized understanding of discharge paper work

## 2017-07-22 ENCOUNTER — Encounter: Payer: Self-pay | Admitting: Certified Nurse Midwife

## 2017-07-22 LAB — URINE CULTURE

## 2017-08-14 ENCOUNTER — Emergency Department
Admission: EM | Admit: 2017-08-14 | Discharge: 2017-08-14 | Disposition: A | Discharge status: Home / Self Care | Payer: Medicaid Other | Attending: Emergency Medicine | Admitting: Emergency Medicine

## 2017-08-14 DIAGNOSIS — Z79899 Other long term (current) drug therapy: Secondary | ICD-10-CM | POA: Insufficient documentation

## 2017-08-14 DIAGNOSIS — R51 Headache: Secondary | ICD-10-CM | POA: Insufficient documentation

## 2017-08-14 DIAGNOSIS — F17210 Nicotine dependence, cigarettes, uncomplicated: Secondary | ICD-10-CM | POA: Insufficient documentation

## 2017-08-14 DIAGNOSIS — O99331 Smoking (tobacco) complicating pregnancy, first trimester: Secondary | ICD-10-CM | POA: Diagnosis not present

## 2017-08-14 DIAGNOSIS — Z3A12 12 weeks gestation of pregnancy: Secondary | ICD-10-CM | POA: Diagnosis not present

## 2017-08-14 DIAGNOSIS — O9989 Other specified diseases and conditions complicating pregnancy, childbirth and the puerperium: Secondary | ICD-10-CM | POA: Insufficient documentation

## 2017-08-14 DIAGNOSIS — R519 Headache, unspecified: Secondary | ICD-10-CM

## 2017-08-14 MED ORDER — ACETAMINOPHEN 500 MG PO TABS
1000.0000 mg | ORAL_TABLET | Freq: Once | ORAL | Status: AC
  Administered 2017-08-14: 1000 mg via ORAL
  Filled 2017-08-14: qty 2

## 2017-08-14 MED ORDER — METOCLOPRAMIDE HCL 5 MG/ML IJ SOLN
10.0000 mg | Freq: Once | INTRAMUSCULAR | Status: AC
  Administered 2017-08-14: 10 mg via INTRAVENOUS
  Filled 2017-08-14: qty 2

## 2017-08-14 MED ORDER — SODIUM CHLORIDE 0.9 % IV BOLUS
1000.0000 mL | Freq: Once | INTRAVENOUS | Status: AC
  Administered 2017-08-14: 1000 mL via INTRAVENOUS

## 2017-08-14 MED ORDER — DIPHENHYDRAMINE HCL 50 MG/ML IJ SOLN
50.0000 mg | Freq: Once | INTRAMUSCULAR | Status: AC
  Administered 2017-08-14: 50 mg via INTRAVENOUS
  Filled 2017-08-14: qty 1

## 2017-08-14 NOTE — Discharge Instructions (Addendum)
As discussed please follow-up with your doctor tomorrow for recheck/reevaluation.  If your headache worsens or you develop any weakness/numbness, confusion or slurred speech please return immediately to the emergency department for further evaluation.

## 2017-08-14 NOTE — ED Triage Notes (Signed)
Pt presents via POV c/o headache since last PM at apx 2200. Pt reports taking Tylenol without improvement. Woke up this am and reports that cannot see out of left eye, pt states everything blurry in left eye. Also reports tingling in left hand. Pt reports ambulating without difficulty and denies weakness in LLE. A&Ox4.

## 2017-08-14 NOTE — ED Provider Notes (Signed)
[facility] Emergency Department Provider Note  Time seen: 6:48 PM  I have reviewed the triage vital signs and the nursing notes.   HISTORY  Chief Complaint Headache    HPI [name] Moreno is a 37 y.o. female with a past medical history of depression, G3 P1 A1, [redacted] weeks pregnant who presents to the emergency department for a headache with visual changes and tingling.  According to the patient since yesterday morning she has had a headache which has progressively worsened.  States it is now a 9 or 10 out of 10 in severity.  States she has had blurred vision as well in her left eye.  States she has been having tingling sensations in both of her feet as well as tingling in her left hand today.  Denies any weakness or numbness confusion or slurred speech.  Patient has a history of hyperemesis gravidarum has been prescribed multiple nausea medications which she has been taking at home.  Use Tylenol at 10:00 this morning with minimal relief of the headache.  Called her doctor who recommended she come to the emergency department for evaluation.  Patient states a history of headaches but has been many years per patient since she has had a severe headache.  Describes this headache as dull aching behind her left eye.  Currently patient appears well, speaking in no distress but is rubbing her left eye.   Past Medical History:  Diagnosis Date  . Depression   . Heart murmur   . Thyroid nodule     Patient Active Problem List   Diagnosis Date Noted  . Heart murmur   . Depression   . Multinodular goiter 01/04/2012    History reviewed. No pertinent surgical history.  Prior to Admission medications   Medication Sig Start Date End Date Taking? Authorizing Provider  Doxylamine-Pyridoxine 10-10 MG TBEC Take 2 tablets by mouth 2 (two) times daily. 07/20/17   Nita Sickle, MD  metoCLOPramide (REGLAN) 10 MG tablet Take 1 tablet (10 mg total) by mouth every 8 (eight) hours as  needed for up to 3 days for nausea. 07/20/17 07/23/17  Nita Sickle, MD  ondansetron [facility]) 4 MG tablet Take 1-2 tabs by mouth every 8 hours as needed for nausea/vomiting 12/07/14   Loleta Rose, MD  ondansetron [facility]) 4 MG tablet Take 1 tablet (4 mg total) by mouth daily as needed for nausea or vomiting. 07/07/17   Emily Filbert, MD  oxyCODONE-acetaminophen (PERCOCET) 7.5-325 MG per tablet Take 1 tablet by mouth as needed.    [provider]  promethazine (PHENERGAN) 25 MG suppository Place 1 suppository (25 mg total) rectally every 6 (six) hours as needed for nausea. 07/07/17 07/07/18  Emily Filbert, MD  promethazine (PHENERGAN) 25 MG tablet Take 25 mg by mouth as needed.    [provider]    No Known Allergies  Family History  Problem Relation Age of Onset  . Hypertension Other        Grandparents  . Diabetes Other        Grandparents    Social History Social History   Tobacco Use  . Smoking status: Current Every Day Smoker    Packs/day: 0.30    Types: Cigarettes  . Smokeless tobacco: Never Used  Substance Use Topics  . Alcohol use: Yes  . Drug use: No    Review of Systems Constitutional: Negative for fever. Eyes: States blurred vision in the left eye. ENT: Negative for recent illness/congestion Cardiovascular: Negative  for chest pain. Respiratory: Negative for shortness of breath. Gastrointestinal: Negative for abdominal pain, vomiting  Genitourinary: Negative for urinary compaints.  Denies vaginal bleeding or leakage of fluid. Musculoskeletal: Negative for musculoskeletal complaints Skin: Negative for skin complaints  Neurological: Severe headache.  States tingling in both feet as well as her left hand.  No focal weakness or numbness. All other ROS negative  ____________________________________________   PHYSICAL EXAM:  VITAL SIGNS: ED Triage Vitals [08/14/17 1646]  Enc Vitals Group     BP 104/66     Pulse Rate (!) 104      Resp 14     Temp 98.7 F (37.1 C)     Temp src      SpO2 99 %     Weight 158 lb (71.7 kg)     Height  (1.6 m)     Head Circumference      Peak Flow      Pain Score 10     Pain Loc      Pain Edu?      Excl. in GC?     Constitutional: Alert and oriented. Well appearing and in no distress. Eyes: Normal eye exam, no ptosis.  Perl, extraocular muscles intact.  Does state mild discomfort with light/photophobia. ENT   Head: Normocephalic and atraumatic.   Mouth/Throat: Mucous membranes are moist. Cardiovascular: Normal rate, regular rhythm. No murmur Respiratory: Normal respiratory effort without tachypnea nor retractions. Breath sounds are clear Gastrointestinal: Soft and nontender. No distention.  Musculoskeletal: Nontender with normal range of motion in all extremities.  Neurologic:  Normal speech and language. No gross focal neurologic deficits.  Equal grip strength bilaterally.  No pronator drift.  Cranial nerves intact. Skin:  Skin is warm, dry and intact.  Psychiatric: Mood and affect are normal. Speech and behavior are normal.   ____________________________________________   INITIAL IMPRESSION / ASSESSMENT AND PLAN / ED COURSE  Pertinent labs & imaging results that were available during my care of the patient were reviewed by me and considered in my medical decision making (see chart for details).  Patient presents to the emergency department for headache and tingling in her left hand as well as both feet.  Differential would include migraine headache, tension headache, dehydration, less likely central venous thrombosis.  Reassuringly patient has a normal neurological examination, overall appears very well, normal vitals.  We will treat the patient's headache with Tylenol, fluids Reglan and Benadryl and continue to closely monitor.  If symptoms improve with treatment anticipate likely discharge home with OB/GYN follow-up.  If symptoms do not improve we will discuss  further work-up.   Recent states nearly no change after medications were given.  I discussed with the patient given her severity of headache with no significant history of headaches recently as well as her tingling sensation we should proceed with an MRI/MRV to rule out a more concerning cause of headache such as central venous thrombosis.  Patient states that she cannot stay for an MRI and is asking to be discharged from the emergency department.  States she will follow-up with her doctor regarding her headache, I again insisted that if her headache is still significant that we should proceed with MRI.  Patient states her ride is here and has to work in the morning and she cannot wait any longer.  I discussed with the patient if her headache worsens at all or she develops any weakness or numbness she needs to return immediately to the emergency department.  Patient agreeable to this plan of care.  ____________________________________________   FINAL CLINICAL IMPRESSION(S) / ED DIAGNOSES  Headache in pregnancy    Minna Antis, MD 08/14/17 2033

## 2017-08-14 NOTE — ED Triage Notes (Signed)
Pt 12 weeks OB per pt report.

## 2017-08-15 ENCOUNTER — Encounter: Payer: Self-pay | Admitting: Obstetrics & Gynecology

## 2017-08-15 ENCOUNTER — Ambulatory Visit (INDEPENDENT_AMBULATORY_CARE_PROVIDER_SITE_OTHER): Payer: Medicaid Other | Admitting: Obstetrics & Gynecology

## 2017-08-15 ENCOUNTER — Ambulatory Visit
Admission: RE | Admit: 2017-08-15 | Discharge: 2017-08-15 | Disposition: A | Discharge status: Home / Self Care | Payer: Medicaid Other | Source: Ambulatory Visit | Attending: Obstetrics & Gynecology | Admitting: Obstetrics & Gynecology

## 2017-08-15 ENCOUNTER — Other Ambulatory Visit (HOSPITAL_COMMUNITY)
Admission: RE | Admit: 2017-08-15 | Discharge: 2017-08-15 | Disposition: A | Discharge status: Home / Self Care | Payer: Medicaid Other | Source: Ambulatory Visit | Attending: Obstetrics & Gynecology | Admitting: Obstetrics & Gynecology

## 2017-08-15 VITALS — BP 104/66 | HR 95 | Temp 98.7°F | Wt 162.0 lb

## 2017-08-15 DIAGNOSIS — R51 Headache: Secondary | ICD-10-CM | POA: Insufficient documentation

## 2017-08-15 DIAGNOSIS — R9089 Other abnormal findings on diagnostic imaging of central nervous system: Secondary | ICD-10-CM | POA: Diagnosis not present

## 2017-08-15 DIAGNOSIS — Z8759 Personal history of other complications of pregnancy, childbirth and the puerperium: Secondary | ICD-10-CM | POA: Insufficient documentation

## 2017-08-15 DIAGNOSIS — O4693 Antepartum hemorrhage, unspecified, third trimester: Secondary | ICD-10-CM | POA: Insufficient documentation

## 2017-08-15 DIAGNOSIS — O26891 Other specified pregnancy related conditions, first trimester: Secondary | ICD-10-CM | POA: Diagnosis not present

## 2017-08-15 DIAGNOSIS — E042 Nontoxic multinodular goiter: Secondary | ICD-10-CM

## 2017-08-15 DIAGNOSIS — O0990 Supervision of high risk pregnancy, unspecified, unspecified trimester: Secondary | ICD-10-CM | POA: Insufficient documentation

## 2017-08-15 DIAGNOSIS — R519 Headache, unspecified: Secondary | ICD-10-CM

## 2017-08-15 DIAGNOSIS — Z3689 Encounter for other specified antenatal screening: Secondary | ICD-10-CM

## 2017-08-15 DIAGNOSIS — O0991 Supervision of high risk pregnancy, unspecified, first trimester: Secondary | ICD-10-CM | POA: Diagnosis not present

## 2017-08-15 DIAGNOSIS — N760 Acute vaginitis: Secondary | ICD-10-CM

## 2017-08-15 DIAGNOSIS — Z3A00 Weeks of gestation of pregnancy not specified: Secondary | ICD-10-CM | POA: Diagnosis not present

## 2017-08-15 DIAGNOSIS — O219 Vomiting of pregnancy, unspecified: Secondary | ICD-10-CM

## 2017-08-15 DIAGNOSIS — O09211 Supervision of pregnancy with history of pre-term labor, first trimester: Secondary | ICD-10-CM

## 2017-08-15 DIAGNOSIS — B9689 Other specified bacterial agents as the cause of diseases classified elsewhere: Secondary | ICD-10-CM

## 2017-08-15 DIAGNOSIS — O09219 Supervision of pregnancy with history of pre-term labor, unspecified trimester: Secondary | ICD-10-CM | POA: Insufficient documentation

## 2017-08-15 DIAGNOSIS — O26899 Other specified pregnancy related conditions, unspecified trimester: Secondary | ICD-10-CM

## 2017-08-15 MED ORDER — PREPLUS 27-1 MG PO TABS: 1.0000 | ORAL_TABLET | Freq: Every day | ORAL | 13 refills | Status: DC

## 2017-08-15 MED ORDER — DOXYLAMINE-PYRIDOXINE ER 20-20 MG PO TBCR: 1.0000 | EXTENDED_RELEASE_TABLET | Freq: Every day | ORAL | 6 refills | Status: DC

## 2017-08-15 NOTE — Progress Notes (Signed)
Subjective:   [name] Moreno is a 37 y.o. [id number] at 26w2dby LMP, early ultrasound being seen today for her first obstetrical visit.  Her obstetrical history is significant for history of preterm delivery at 36 weeks after placental abruption. Patient does not intend to breast feed. Pregnancy history fully reviewed.  Patient reports severe headache x 2 days.  Concentrated on her left side, associated with throbbing pain behind left eye with intemittent blurry vision. Has history of migraines, never like this.  [facility]; MRI recommended but she was unable to stay for this after waiting for almost 6 hours in the ED.  She was given some pain medication which helped with pain, but she still has left eye pain.  She is concerned something is wrong behind her left eye or in her brain and really wants MRI for evaluation.  Of note, patient has also been seen in the ED several times for N/V of pregnancy, now controlled on Zofran, Phenergan, and Reglan. Also takes BCroatiaas needed.  HISTORY: OB History  Gravida Para Term Preterm AB Living  3 1 0 1 1 1   SAB TAB Ectopic Multiple Live Births  1 0 0 0 1    # Outcome Date GA Lbr Len/2nd Weight Sex Delivery Anes PTL Lv  3 Current           2 SAB 04/19/17     SAB     1 Preterm 05/29/03 353w0d  Vag-Spont   LIV     Birth Comments: Had "placental separation" leading to preterm delivery     Complications: Abruptio Placenta    Last pap smear was done several years ago and was normal  Past Medical History:  Diagnosis Date  . Depression   . Heart murmur   . Thyroid nodule    History reviewed. No pertinent surgical history. Family History  Problem Relation Age of Onset  . Hypertension Other        Grandparents  . Diabetes Other        Grandparents   Social History   Tobacco Use  . Smoking status: Former Smoker    Packs/day: 0.30    Types: Cigarettes    Start date: 07/01/2017  . Smokeless tobacco: Never  Used  Substance Use Topics  . Alcohol use: Yes  . Drug use: No   No Known Allergies Current Outpatient Medications on File Prior to Visit  Medication Sig Dispense Refill  . metoCLOPramide (REGLAN) 10 MG tablet Take 1 tablet (10 mg total) by mouth every 8 (eight) hours as needed for up to 3 days for nausea. 20 tablet 0  . ondansetron (ZOFRAN) 4 MG tablet Take 1 tablet (4 mg total) by mouth daily as needed for nausea or vomiting. 20 tablet 1  . promethazine (PHENERGAN) 25 MG suppository Place 1 suppository (25 mg total) rectally every 6 (six) hours as needed for nausea. (Patient not taking: Reported on 08/15/2017) 12 suppository 1   No current facility-administered medications on file prior to visit.     Review of Systems Pertinent items noted in HPI and remainder of comprehensive ROS otherwise negative.  Exam   Vitals:   08/16/17 0823  BP: 104/66  Pulse: 95  Temp: 98.7 F (37.1 C)  Weight: 162 lb (73.5 kg)   Fetal Heart Rate (bpm): 150 on ultrasound  Uterus:     Pelvic Exam: Perineum: no hemorrhoids, normal perineum   Vulva: normal external  genitalia, no lesions   Vagina:  normal mucosa, normal discharge   Cervix: no lesions and normal, pap smear done.    Adnexa: normal adnexa and no mass, fullness, tenderness   Bony Pelvis: average  System: General: well-developed, well-nourished female in no acute distress   Breast:  normal appearance, no masses or tenderness   Skin: normal coloration and turgor, no rashes   Neurologic: oriented, normal, negative, normal mood   Extremities: normal strength, tone, and muscle mass, ROM of all joints is normal   HEENT PERRLA, extraocular movement intact and sclera clear, anicteric   Mouth/Teeth mucous membranes moist, pharynx normal without lesions and dental hygiene good   Neck supple and no masses   Cardiovascular: regular rate and rhythm   Respiratory:  no respiratory distress, normal breath sounds   Abdomen: soft, non-tender; bowel  sounds normal; no masses,  no organomegaly     Assessment:   Pregnancy: [id number] Patient Active Problem List   Diagnosis Date Noted  . Supervision of high-risk pregnancy 08/15/2017  . Nausea and vomiting of pregnancy, antepartum 08/15/2017  . Previous preterm delivery at 36 weeks, antepartum 08/15/2017  . History of placental abruption 08/15/2017  . Heart murmur   . Depression   . Multinodular goiter 01/04/2012     Plan:  1. Pregnancy headache in first trimester Given her symptoms, MRI was ordered. Will follow up results and manage accordingly. Will refer to Neurologist or Ophthalmologist based on results and continued symptoms. Urged to return to ED for worsening symptoms. - MR BRAIN W WO CONTRAST; Future - Comp Met (CMET)  2. Previous preterm delivery at 32 weeks, antepartum 3. History of placental abruption Denies any history of any hypertensive disorder or substance abuse.  Counseled about 17 P, she desires this. ASA 81 mg po daily also ordered.  4. Multinodular goiter Normal TSH in 2013; negative/benign FNA biopsy in 2015. Will check TSH today.  - TSH  5. Nausea and vomiting of pregnancy, antepartum Bonjesta refill given - Doxylamine-Pyridoxine ER (BONJESTA) 20-20 MG TBCR; Take 1 tablet by mouth at bedtime. May add 1 tab midday if needed  Dispense: 60 tablet; Refill: 6  6. Encounter for fetal anatomic survey Anatomy scan ordered - US MFM OB DETAIL +14 WK; Future  7. Supervision of high risk pregnancy in first trimester Prenatal labs ordered including NIPS - Obstetric Panel, Including HIV - Culture, OB Urine - Cytology - PAP - Genetic Screening - Hemoglobinopathy evaluation - Cystic Fibrosis Mutation 97 - SMN1 COPY NUMBER ANALYSIS (SMA Carrier Screen) - TSH - Hemoglobin A1c - Prenatal Vit-Fe Fumarate-FA (PREPLUS) 27-1 MG TABS; Take 1 tablet by mouth daily.  Dispense: 30 tablet; Refill: 13 - Doxylamine-Pyridoxine ER (BONJESTA) 20-20 MG TBCR; Take 1 tablet by mouth  at bedtime. May add 1 tab midday if needed  Dispense: 60 tablet; Refill: 6 - Comp Met (CMET) - [place] MFM OB DETAIL +14 WK; Future  Continue prenatal vitamins. Problem list reviewed and updated. The nature of [facility] with multiple MDs and other Advanced Practice Providers was explained to patient; also emphasized that residents, students are part of our team. Routine obstetric precautions reviewed. Return in about 1 month (around 09/12/2017) for OB Visit and 17P initiation.     Verita Schneiders, MD, [facility] [employer], [place]

## 2017-08-15 NOTE — Patient Instructions (Signed)
First Trimester of Pregnancy The first trimester of pregnancy is from week 1 until the end of week 13 (months 1 through 3). A week after a sperm fertilizes an egg, the egg will implant on the wall of the uterus. This embryo will begin to develop into a baby. Genes from you and your partner will form the baby. The female genes will determine whether the baby will be a boy or a girl. At 6-8 weeks, the eyes and face will be formed, and the heartbeat can be seen on ultrasound. At the end of 12 weeks, all the baby's organs will be formed. Now that you are pregnant, you will want to do everything you can to have a healthy baby. Two of the most important things are to get good prenatal care and to follow your health care provider's instructions. Prenatal care is all the medical care you receive before the baby's birth. This care will help prevent, find, and treat any problems during the pregnancy and childbirth. Body changes during your first trimester Your body goes through many changes during pregnancy. The changes vary from woman to woman.  You may gain or lose a couple of pounds at first.  You may feel sick to your stomach (nauseous) and you may throw up (vomit). If the vomiting is uncontrollable, call your health care provider.  You may tire easily.  You may develop headaches that can be relieved by medicines. All medicines should be approved by your health care provider.  You may urinate more often. Painful urination may mean you have a bladder infection.  You may develop heartburn as a result of your pregnancy.  You may develop constipation because certain hormones are causing the muscles that push stool through your intestines to slow down.  You may develop hemorrhoids or swollen veins (varicose veins).  Your breasts may begin to grow larger and become tender. Your nipples may stick out more, and the tissue that surrounds them (areola) may become darker.  Your gums may bleed and may be  sensitive to brushing and flossing.  Dark spots or blotches (chloasma, mask of pregnancy) may develop on your face. This will likely fade after the baby is born.  Your menstrual periods will stop.  You may have a loss of appetite.  You may develop cravings for certain kinds of food.  You may have changes in your emotions from day to day, such as being excited to be pregnant or being concerned that something may go wrong with the pregnancy and baby.  You may have more vivid and strange dreams.  You may have changes in your hair. These can include thickening of your hair, rapid growth, and changes in texture. Some women also have hair loss during or after pregnancy, or hair that feels dry or thin. Your hair will most likely return to normal after your baby is born.  What to expect at prenatal visits During a routine prenatal visit:  You will be weighed to make sure you and the baby are growing normally.  Your blood pressure will be taken.  Your abdomen will be measured to track your baby's growth.  The fetal heartbeat will be listened to between weeks 10 and 14 of your pregnancy.  Test results from any previous visits will be discussed.  Your health care provider may ask you:  How you are feeling.  If you are feeling the baby move.  If you have had any abnormal symptoms, such as leaking fluid, bleeding, severe headaches,   or abdominal cramping.  If you are using any tobacco products, including cigarettes, chewing tobacco, and electronic cigarettes.  If you have any questions.  Other tests that may be performed during your first trimester include:  Blood tests to find your blood type and to check for the presence of any previous infections. The tests will also be used to check for low iron levels (anemia) and protein on red blood cells (Rh antibodies). Depending on your risk factors, or if you previously had diabetes during pregnancy, you may have tests to check for high blood  sugar that affects pregnant women (gestational diabetes).  Urine tests to check for infections, diabetes, or protein in the urine.  An ultrasound to confirm the proper growth and development of the baby.  Fetal screens for spinal cord problems (spina bifida) and Down syndrome.  HIV (human immunodeficiency virus) testing. Routine prenatal testing includes screening for HIV, unless you choose not to have this test.  You may need other tests to make sure you and the baby are doing well.  Follow these instructions at home: Medicines  Follow your health care provider's instructions regarding medicine use. Specific medicines may be either safe or unsafe to take during pregnancy.  Take a prenatal vitamin that contains at least 600 micrograms (mcg) of folic acid.  If you develop constipation, try taking a stool softener if your health care provider approves. Eating and drinking  Eat a balanced diet that includes fresh fruits and vegetables, whole grains, good sources of protein such as meat, eggs, or tofu, and low-fat dairy. Your health care provider will help you determine the amount of weight gain that is right for you.  Avoid raw meat and uncooked cheese. These carry germs that can cause birth defects in the baby.  Eating four or five small meals rather than three large meals a day may help relieve nausea and vomiting. If you start to feel nauseous, eating a few soda crackers can be helpful. Drinking liquids between meals, instead of during meals, also seems to help ease nausea and vomiting.  Limit foods that are high in fat and processed sugars, such as fried and sweet foods.  To prevent constipation: ? Eat foods that are high in fiber, such as fresh fruits and vegetables, whole grains, and beans. ? Drink enough fluid to keep your urine clear or pale yellow. Activity  Exercise only as directed by your health care provider. Most women can continue their usual exercise routine during  pregnancy. Try to exercise for 30 minutes at least 5 days a week. Exercising will help you: ? Control your weight. ? Stay in shape. ? Be prepared for labor and delivery.  Experiencing pain or cramping in the lower abdomen or lower back is a good sign that you should stop exercising. Check with your health care provider before continuing with normal exercises.  Try to avoid standing for long periods of time. Move your legs often if you must stand in one place for a long time.  Avoid heavy lifting.  Wear low-heeled shoes and practice good posture.  You may continue to have sex unless your health care provider tells you not to. Relieving pain and discomfort  Wear a good support bra to relieve breast tenderness.  Take warm sitz baths to soothe any pain or discomfort caused by hemorrhoids. Use hemorrhoid cream if your health care provider approves.  Rest with your legs elevated if you have leg cramps or low back pain.  If you develop   varicose veins in your legs, wear support hose. Elevate your feet for 15 minutes, 3-4 times a day. Limit salt in your diet. Prenatal care  Schedule your prenatal visits by the twelfth week of pregnancy. They are usually scheduled monthly at first, then more often in the last 2 months before delivery.  Write down your questions. Take them to your prenatal visits.  Keep all your prenatal visits as told by your health care provider. This is important. Safety  Wear your seat belt at all times when driving.  Make a list of emergency phone numbers, including numbers for family, friends, the hospital, and police and fire departments. General instructions  Ask your health care provider for a referral to a local prenatal education class. Begin classes no later than the beginning of month 6 of your pregnancy.  Ask for help if you have counseling or nutritional needs during pregnancy. Your health care provider can offer advice or refer you to specialists for help  with various needs.  Do not use hot tubs, steam rooms, or saunas.  Do not douche or use tampons or scented sanitary pads.  Do not cross your legs for long periods of time.  Avoid cat litter boxes and soil used by cats. These carry germs that can cause birth defects in the baby and possibly loss of the fetus by miscarriage or stillbirth.  Avoid all smoking, herbs, alcohol, and medicines not prescribed by your health care provider. Chemicals in these products affect the formation and growth of the baby.  Do not use any products that contain nicotine or tobacco, such as cigarettes and e-cigarettes. If you need help quitting, ask your health care provider. You may receive counseling support and other resources to help you quit.  Schedule a dentist appointment. At home, brush your teeth with a soft toothbrush and be gentle when you floss. Contact a health care provider if:  You have dizziness.  You have mild pelvic cramps, pelvic pressure, or nagging pain in the abdominal area.  You have persistent nausea, vomiting, or diarrhea.  You have a bad smelling vaginal discharge.  You have pain when you urinate.  You notice increased swelling in your face, hands, legs, or ankles.  You are exposed to fifth disease or chickenpox.  You are exposed to German measles (rubella) and have never had it. Get help right away if:  You have a fever.  You are leaking fluid from your vagina.  You have spotting or bleeding from your vagina.  You have severe abdominal cramping or pain.  You have rapid weight gain or loss.  You vomit blood or material that looks like coffee grounds.  You develop a severe headache.  You have shortness of breath.  You have any kind of trauma, such as from a fall or a car accident. Summary  The first trimester of pregnancy is from week 1 until the end of week 13 (months 1 through 3).  Your body goes through many changes during pregnancy. The changes vary from  woman to woman.  You will have routine prenatal visits. During those visits, your health care provider will examine you, discuss any test results you may have, and talk with you about how you are feeling. This information is not intended to replace advice given to you by your health care provider. Make sure you discuss any questions you have with your health care provider. Document Released: 03/30/2001 Document Revised: 03/17/2016 Document Reviewed: 03/17/2016 Elsevier Interactive Patient Education  2018 Elsevier   Inc.  

## 2017-08-15 NOTE — Progress Notes (Deleted)
hjh

## 2017-08-15 NOTE — Progress Notes (Signed)
DATING AND VIABILITY SONOGRAM   [name] Moreno is a 37 y.o. year old G3P0111 with LMP Patient's last menstrual period was 05/21/2017. which would correlate to  [redacted]w[redacted]d weeks gestation.  She has regular menstrual cycles.   She is here today for a confirmatory initial sonogram.    GESTATION: SINGLETON     FETAL ACTIVITY:          Heart rate: 150BPM          The fetus is active.  Gestational criteria: Estimated Date of Delivery: 02/25/18 by LMP now at [redacted]w[redacted]d  Previous Scans:1 HC 6.46CM 12.3WKS  FL 0.931CM 12.5WKS  CRL 6.50CM 12.5WKS                                                   AVERAGE EGA(BY THIS SCAN):  12.4 weeks  WORKING EDD( LMP ):  02/25/2018     TECHNICIAN COMMENTS: SLIUP measuring 12.4wks AUA with FHR 150bpm

## 2017-08-16 LAB — OBSTETRIC PANEL, INCLUDING HIV
Antibody Screen: NEGATIVE
BASOS ABS: 0 10*3/uL (ref 0.0–0.2)
Basos: 0 %
EOS (ABSOLUTE): 0 10*3/uL (ref 0.0–0.4)
Eos: 0 %
HEP B S AG: NEGATIVE
HIV Screen 4th Generation wRfx: NONREACTIVE
Hematocrit: 33.5 % — ABNORMAL LOW (ref 34.0–46.6)
Hemoglobin: 10.5 g/dL — ABNORMAL LOW (ref 11.1–15.9)
Immature Grans (Abs): 0 10*3/uL (ref 0.0–0.1)
Immature Granulocytes: 1 %
LYMPHS ABS: 2.2 10*3/uL (ref 0.7–3.1)
Lymphs: 27 %
MCH: 23.7 pg — ABNORMAL LOW (ref 26.6–33.0)
MCHC: 31.3 g/dL — ABNORMAL LOW (ref 31.5–35.7)
MCV: 76 fL — ABNORMAL LOW (ref 79–97)
Monocytes Absolute: 0.8 10*3/uL (ref 0.1–0.9)
Monocytes: 10 %
Neutrophils Absolute: 4.9 10*3/uL (ref 1.4–7.0)
Neutrophils: 62 %
PLATELETS: 222 10*3/uL (ref 150–379)
RBC: 4.43 x10E6/uL (ref 3.77–5.28)
RDW: 16.3 % — ABNORMAL HIGH (ref 12.3–15.4)
RH TYPE: POSITIVE
RPR: NONREACTIVE
Rubella Antibodies, IGG: 25 index (ref >0.99)
WBC: 7.9 10*3/uL (ref 3.4–10.8)

## 2017-08-16 LAB — COMPREHENSIVE METABOLIC PANEL
A/G RATIO: 1.3 (ref 1.2–2.2)
ALT: 15 IU/L (ref 0–32)
AST: 14 IU/L (ref 0–40)
Albumin: 3.9 g/dL (ref 3.5–5.5)
Alkaline Phosphatase: 57 IU/L (ref 39–117)
BUN/Creatinine Ratio: 9 (ref 9–23)
BUN: 6 mg/dL (ref 6–20)
Bilirubin Total: <0.2 mg/dL (ref 0.0–1.2)
CALCIUM: 8.7 mg/dL (ref 8.7–10.2)
CO2: 20 mmol/L (ref 20–29)
Chloride: 101 mmol/L (ref 96–106)
Creatinine, Ser: 0.65 mg/dL (ref 0.57–1.00)
GFR, EST AFRICAN AMERICAN: 134 mL/min/{1.73_m2} (ref >59)
GFR, EST NON AFRICAN AMERICAN: 116 mL/min/{1.73_m2} (ref >59)
Globulin, Total: 3.1 g/dL (ref 1.5–4.5)
Glucose: 79 mg/dL (ref 65–99)
POTASSIUM: 3.7 mmol/L (ref 3.5–5.2)
Sodium: 136 mmol/L (ref 134–144)
TOTAL PROTEIN: 7 g/dL (ref 6.0–8.5)

## 2017-08-16 LAB — HEMOGLOBIN A1C
Est. average glucose Bld gHb Est-mCnc: 105 mg/dL
HEMOGLOBIN A1C: 5.3 % (ref 4.8–5.6)

## 2017-08-16 LAB — TSH: TSH: 0.835 u[IU]/mL (ref 0.450–4.500)

## 2017-08-16 MED ORDER — ASPIRIN EC 81 MG PO TBEC
81.0000 mg | DELAYED_RELEASE_TABLET | Freq: Every day | ORAL | 2 refills | Status: DC
Start: 2017-08-16 — End: 2018-01-13

## 2017-08-16 NOTE — Addendum Note (Signed)
Addended by: Cheree Ditto, Larkin Alfred A on: 08/16/2017 02:39 PM   Modules accepted: Kipp Brood

## 2017-08-17 ENCOUNTER — Encounter: Payer: Medicaid Other | Admitting: Family Medicine

## 2017-08-17 LAB — CYTOLOGY - PAP
BACTERIAL VAGINITIS: POSITIVE — AB
Candida vaginitis: NEGATIVE
Chlamydia: NEGATIVE
DIAGNOSIS: NEGATIVE
HPV: NOT DETECTED
NEISSERIA GONORRHEA: NEGATIVE
TRICH (WINDOWPATH): NEGATIVE

## 2017-08-18 LAB — URINE CULTURE, OB REFLEX

## 2017-08-18 LAB — CULTURE, OB URINE

## 2017-08-18 MED ORDER — METRONIDAZOLE 500 MG PO TABS: 500.0000 mg | ORAL_TABLET | Freq: Two times a day (BID) | ORAL | 0 refills | Status: DC

## 2017-08-18 NOTE — Addendum Note (Signed)
Addended by: Jaynie Collins A on: 08/18/2017 09:11 AM   Modules accepted: Orders

## 2017-08-22 ENCOUNTER — Encounter: Payer: Self-pay | Admitting: Radiology

## 2017-08-22 LAB — SMN1 COPY NUMBER ANALYSIS (SMA CARRIER SCREENING)

## 2017-08-22 LAB — HEMOGLOBINOPATHY EVALUATION
HGB A: 97.7 % (ref 96.4–98.8)
HGB C: 0 %
HGB S: 0 %
HGB VARIANT: 0 %
Hemoglobin A2 Quantitation: 2.3 % (ref 1.8–3.2)
Hemoglobin F Quantitation: 0 % (ref 0.0–2.0)

## 2017-08-22 LAB — CYSTIC FIBROSIS MUTATION 97: Interpretation: NOT DETECTED

## 2017-09-14 ENCOUNTER — Ambulatory Visit (INDEPENDENT_AMBULATORY_CARE_PROVIDER_SITE_OTHER): Payer: Medicaid Other | Admitting: Family Medicine

## 2017-09-14 ENCOUNTER — Encounter: Payer: Self-pay | Admitting: Family Medicine

## 2017-09-14 VITALS — BP 112/78 | HR 78 | Wt 168.0 lb

## 2017-09-14 DIAGNOSIS — O0992 Supervision of high risk pregnancy, unspecified, second trimester: Secondary | ICD-10-CM

## 2017-09-14 DIAGNOSIS — E042 Nontoxic multinodular goiter: Secondary | ICD-10-CM

## 2017-09-14 DIAGNOSIS — O09219 Supervision of pregnancy with history of pre-term labor, unspecified trimester: Secondary | ICD-10-CM

## 2017-09-14 DIAGNOSIS — Z8759 Personal history of other complications of pregnancy, childbirth and the puerperium: Secondary | ICD-10-CM

## 2017-09-14 DIAGNOSIS — O219 Vomiting of pregnancy, unspecified: Secondary | ICD-10-CM

## 2017-09-14 MED ORDER — HYDROXYPROGESTERONE CAPROATE 275 MG/1.1ML ~~LOC~~ SOAJ
275.0000 mg | Freq: Once | SUBCUTANEOUS | Status: AC
  Administered 2017-09-14: 275 mg via SUBCUTANEOUS

## 2017-09-14 NOTE — Progress Notes (Signed)
Noticed lump on breast a couple days ago She reports having a headache for two days now.

## 2017-09-14 NOTE — Progress Notes (Signed)
   PRENATAL VISIT NOTE  Subjective:  [name] Moreno is a 37 y.o. G2X5284 at [redacted]w[redacted]d being seen today for ongoing prenatal care.  She is currently monitored for the following issues for this high-risk pregnancy and has Multinodular goiter; Heart murmur; Depression; Supervision of high-risk pregnancy; Nausea and vomiting of pregnancy, antepartum; Previous preterm delivery at 36 weeks, antepartum; and History of placental abruption on their problem list.  Patient reports HA- related to allergies, tried tylenol and flonase.  Contractions: Not present.  .  Movement: Present. Denies leaking of fluid.   The following portions of the patient's history were reviewed and updated as appropriate: allergies, current medications, past family history, past medical history, past social history, past surgical history and problem list. Problem list updated.  Objective:   Vitals:   09/14/17 1524  BP: 112/78  Pulse: 78  Weight: 168 lb (76.2 kg)    Fetal Status: Fetal Heart Rate (bpm): 145   Movement: Present     General:  Alert, oriented and cooperative. Patient is in no acute distress.  Skin: Skin is warm and dry. No rash noted.   Cardiovascular: Normal heart rate noted  Respiratory: Normal respiratory effort, no problems with respiration noted  Abdomen: Soft, gravid, appropriate for gestational age.  Pain/Pressure: Absent     Pelvic: Cervical exam deferred        Extremities: Normal range of motion.  Edema: None  Mental Status: Normal mood and affect. Normal behavior. Normal judgment and thought content.   Assessment and Plan:  Pregnancy: [id number] at [redacted]w[redacted]d  1. Nausea and vomiting of pregnancy, antepartum Better with Bojesta  2. Multinodular goiter  3. Previous preterm delivery at 36 weeks, antepartum Started 17 P today  4. History of placental abruption ASA started  5. Breast mass- 0.5cm, right breast 9 oclock position, feels like enlarged breast gland. Present for only 1 week.  Monitor and  low threshold to have imaging.   6. HA- recommended use of claritin OTC for alleriges   Preterm labor symptoms and general obstetric precautions including but not limited to vaginal bleeding, contractions, leaking of fluid and fetal movement were reviewed in detail with the patient. Please refer to After Visit Summary for other counseling recommendations.  Return in about 1 month (around 10/12/2017) for Routine prenatal care.  Future Appointments  Date Time Provider Department Center  09/28/2017 12:45 PM [facility] [place] 2 [facility]  10/12/2017  3:15 PM Federico Flake, MD [facility]    Federico Flake, MD

## 2017-09-14 NOTE — Progress Notes (Signed)
17- P given today in left arm SQ

## 2017-09-20 ENCOUNTER — Encounter (HOSPITAL_COMMUNITY): Payer: Self-pay

## 2017-09-22 ENCOUNTER — Telehealth: Payer: Self-pay

## 2017-09-22 ENCOUNTER — Ambulatory Visit (INDEPENDENT_AMBULATORY_CARE_PROVIDER_SITE_OTHER): Payer: Medicaid Other

## 2017-09-22 VITALS — BP 112/75 | HR 72

## 2017-09-22 DIAGNOSIS — O09212 Supervision of pregnancy with history of pre-term labor, second trimester: Secondary | ICD-10-CM | POA: Diagnosis not present

## 2017-09-22 DIAGNOSIS — O0990 Supervision of high risk pregnancy, unspecified, unspecified trimester: Secondary | ICD-10-CM

## 2017-09-22 NOTE — Telephone Encounter (Signed)
[facility] [id number] Patient tolerated well.

## 2017-09-23 MED ORDER — HYDROXYPROGESTERONE CAPROATE 275 MG/1.1ML ~~LOC~~ SOAJ
275.0000 mg | Freq: Once | SUBCUTANEOUS | Status: AC
  Administered 2017-09-23: 275 mg via SUBCUTANEOUS

## 2017-09-23 NOTE — Telephone Encounter (Signed)
Patient presented to the office today for 17-p injection. Patient tolerated well and will follow up at next office visit.

## 2017-09-23 NOTE — Progress Notes (Signed)
Patient presented to the office today for 17-p injection. Patient tolerated well and will follow up next week for her next injection.

## 2017-09-25 NOTE — Progress Notes (Signed)
Patient seen and assessed by nursing staff.  Agree with documentation and plan.  

## 2017-09-27 ENCOUNTER — Encounter (HOSPITAL_COMMUNITY): Payer: Self-pay

## 2017-09-28 ENCOUNTER — Ambulatory Visit (HOSPITAL_COMMUNITY)
Admission: RE | Admit: 2017-09-28 | Discharge: 2017-09-28 | Disposition: A | Discharge status: Home / Self Care | Payer: Medicaid Other | Source: Ambulatory Visit | Attending: Obstetrics & Gynecology | Admitting: Obstetrics & Gynecology

## 2017-09-28 ENCOUNTER — Other Ambulatory Visit (HOSPITAL_COMMUNITY): Payer: Self-pay | Admitting: *Deleted

## 2017-09-28 ENCOUNTER — Other Ambulatory Visit: Payer: Self-pay | Admitting: Obstetrics & Gynecology

## 2017-09-28 ENCOUNTER — Encounter (HOSPITAL_COMMUNITY): Payer: Self-pay

## 2017-09-28 DIAGNOSIS — Z363 Encounter for antenatal screening for malformations: Secondary | ICD-10-CM | POA: Insufficient documentation

## 2017-09-28 DIAGNOSIS — E079 Disorder of thyroid, unspecified: Secondary | ICD-10-CM | POA: Insufficient documentation

## 2017-09-28 DIAGNOSIS — O09212 Supervision of pregnancy with history of pre-term labor, second trimester: Secondary | ICD-10-CM | POA: Insufficient documentation

## 2017-09-28 DIAGNOSIS — Z3A18 18 weeks gestation of pregnancy: Secondary | ICD-10-CM | POA: Diagnosis not present

## 2017-09-28 DIAGNOSIS — Z3689 Encounter for other specified antenatal screening: Secondary | ICD-10-CM

## 2017-09-28 DIAGNOSIS — Z3686 Encounter for antenatal screening for cervical length: Secondary | ICD-10-CM

## 2017-09-28 DIAGNOSIS — O99282 Endocrine, nutritional and metabolic diseases complicating pregnancy, second trimester: Secondary | ICD-10-CM | POA: Insufficient documentation

## 2017-09-28 DIAGNOSIS — O219 Vomiting of pregnancy, unspecified: Secondary | ICD-10-CM

## 2017-09-28 DIAGNOSIS — O0991 Supervision of high risk pregnancy, unspecified, first trimester: Secondary | ICD-10-CM

## 2017-09-28 DIAGNOSIS — O4402 Placenta previa specified as without hemorrhage, second trimester: Secondary | ICD-10-CM | POA: Diagnosis not present

## 2017-09-28 DIAGNOSIS — Z8759 Personal history of other complications of pregnancy, childbirth and the puerperium: Secondary | ICD-10-CM

## 2017-09-28 DIAGNOSIS — O09219 Supervision of pregnancy with history of pre-term labor, unspecified trimester: Secondary | ICD-10-CM

## 2017-09-28 DIAGNOSIS — O4420 Partial placenta previa NOS or without hemorrhage, unspecified trimester: Secondary | ICD-10-CM

## 2017-09-28 HISTORY — DX: Headache: R51

## 2017-09-28 HISTORY — DX: Headache, unspecified: R51.9

## 2017-09-28 HISTORY — DX: Nontoxic multinodular goiter: E04.2

## 2017-09-29 ENCOUNTER — Encounter: Payer: Self-pay | Admitting: Obstetrics & Gynecology

## 2017-09-29 NOTE — Progress Notes (Signed)
Patient has a posterior placenta previa.  Patient should be advised to avoid vaginal intercourse or putting anything in the vagina as this might cause direct trauma to the previa, resulting in bleeding. Palpation of placenta previa through a partially dilated cervix can result in severe hemorrhage; any digital examination of the cervix should be avoided.  Advise her to seek immediate medical attention if vaginal bleeding occurs, given the potential for severe bleeding and need for interventions such as surgery.  She will have repeat ultrasounds to see if this will resolve over time.   If previa persists, may need to perform cesarean section for delivery at 36 to [redacted] weeks gestation.

## 2017-10-06 ENCOUNTER — Ambulatory Visit (INDEPENDENT_AMBULATORY_CARE_PROVIDER_SITE_OTHER): Payer: Medicaid Other

## 2017-10-06 VITALS — BP 117/78

## 2017-10-06 DIAGNOSIS — O0990 Supervision of high risk pregnancy, unspecified, unspecified trimester: Secondary | ICD-10-CM

## 2017-10-06 DIAGNOSIS — O09212 Supervision of pregnancy with history of pre-term labor, second trimester: Secondary | ICD-10-CM

## 2017-10-06 MED ORDER — HYDROXYPROGESTERONE CAPROATE 275 MG/1.1ML ~~LOC~~ SOAJ
275.0000 mg | Freq: Once | SUBCUTANEOUS | Status: AC
  Administered 2017-10-06: 275 mg via SUBCUTANEOUS

## 2017-10-06 NOTE — Progress Notes (Signed)
Patient presented to the office today for Makena (hydroxyprogeserone) injection 275mg  given sq in right arm.Patient tolerated well and will follow up next week for next injection.

## 2017-10-07 NOTE — Progress Notes (Signed)
I have reviewed this chart and agree with the RN/CMA assessment and management.    Sheriece Jefcoat C Edyth Glomb, MD, FACOG Attending Physician, [facility]  

## 2017-10-12 ENCOUNTER — Encounter: Payer: Medicaid Other | Admitting: Family Medicine

## 2017-10-13 ENCOUNTER — Ambulatory Visit (INDEPENDENT_AMBULATORY_CARE_PROVIDER_SITE_OTHER): Payer: Medicaid Other | Admitting: Obstetrics and Gynecology

## 2017-10-13 VITALS — BP 122/80 | HR 102 | Wt 177.2 lb

## 2017-10-13 DIAGNOSIS — N6001 Solitary cyst of right breast: Secondary | ICD-10-CM

## 2017-10-13 DIAGNOSIS — E042 Nontoxic multinodular goiter: Secondary | ICD-10-CM

## 2017-10-13 DIAGNOSIS — O09212 Supervision of pregnancy with history of pre-term labor, second trimester: Secondary | ICD-10-CM | POA: Diagnosis not present

## 2017-10-13 DIAGNOSIS — O0992 Supervision of high risk pregnancy, unspecified, second trimester: Secondary | ICD-10-CM

## 2017-10-13 DIAGNOSIS — O4402 Placenta previa specified as without hemorrhage, second trimester: Secondary | ICD-10-CM

## 2017-10-13 DIAGNOSIS — O09219 Supervision of pregnancy with history of pre-term labor, unspecified trimester: Secondary | ICD-10-CM

## 2017-10-13 HISTORY — DX: Solitary cyst of right breast: N60.01

## 2017-10-13 MED ORDER — HYDROXYPROGESTERONE CAPROATE 275 MG/1.1ML ~~LOC~~ SOAJ
275.0000 mg | Freq: Once | SUBCUTANEOUS | Status: AC
  Administered 2017-10-13: 275 mg via SUBCUTANEOUS

## 2017-10-13 NOTE — Progress Notes (Signed)
Pt complains of having a knot in her right breast that is getting larger.

## 2017-10-13 NOTE — Addendum Note (Signed)
Addended by: Hamilton CapriBURCH, ARIEL J on: 10/13/2017 11:09 AM   Modules accepted: Orders

## 2017-10-13 NOTE — Progress Notes (Signed)
Prenatal Visit Note Date: 10/13/2017 Clinic: [facility]  Subjective:  [name] PollockMelinda Moreno is a 37 y.o. [phone] at 911w5d being seen today for ongoing prenatal care.  She is currently monitored for the following issues for this high-risk pregnancy and has Multinodular goiter; Placenta previa in second trimester; Depression; Supervision of high-risk pregnancy; Nausea and vomiting of pregnancy, antepartum; Previous preterm delivery at 36 weeks, antepartum; History of placental abruption; and Benign cyst of right breast on their problem list.  Patient reports right breast lump that's getting bigger. Only noticed it since being pregnant. Not painful but growing in size   Contractions: Not present. Vag. Bleeding: None.  Movement: Present. Denies leaking of fluid.   The following portions of the patient's history were reviewed and updated as appropriate: allergies, current medications, past family history, past medical history, past social history, past surgical history and problem list. Problem list updated.  Objective:   Vitals:   10/13/17 1045  BP: 122/80  Pulse: (!) 102  Weight: 177 lb 3.2 oz (80.4 kg)    Fetal Status: Fetal Heart Rate (bpm): 140   Movement: Present     General:  Alert, oriented and cooperative. Patient is in no acute distress.  Skin: Skin is warm and dry. No rash noted.   Cardiovascular: Normal heart rate noted  Respiratory: Normal respiratory effort, no problems with respiration noted  Abdomen: Soft, gravid, appropriate for gestational age. Pain/Pressure: Absent     Pelvic:  Cervical exam deferred        Extremities: Normal range of motion.  Edema: None  Mental Status: Normal mood and affect. Normal behavior. Normal judgment and thought content.  Breast: right breast cyst/lump that's approx 4 x 4cm at the 10 o'clock position, feels mobile, nttp, no overlying skin changes. No LAD. Negative left breast exam  Urinalysis:      Assessment and Plan:   Pregnancy: [id number] at 571w5d  1. Previous preterm delivery at 36 weeks, antepartum - HYDROXYprogesterone Caproate SOAJ 275 mg  2. Supervision of high risk pregnancy in second trimester  3. Multinodular goiter  4. Placenta previa in second trimester Has rpt u/s in July. Continue pelvic rest  5. Benign cyst of right breast - US BREAST COMPLETE UNI RIGHT INC AXILLA; Future  Preterm labor symptoms and general obstetric precautions including but not limited to vaginal bleeding, contractions, leaking of fluid and fetal movement were reviewed in detail with the patient. Please refer to After Visit Summary for other counseling recommendations.  Return in about 3 weeks (around 11/03/2017) for rob. 1wk for 17p   Murray BingPickens, [name] Irigoyen, MD

## 2017-10-18 ENCOUNTER — Ambulatory Visit
Admission: RE | Admit: 2017-10-18 | Discharge: 2017-10-18 | Disposition: A | Discharge status: Home / Self Care | Payer: Medicaid Other | Source: Ambulatory Visit | Attending: Obstetrics and Gynecology | Admitting: Obstetrics and Gynecology

## 2017-10-18 ENCOUNTER — Other Ambulatory Visit: Payer: Self-pay | Admitting: Obstetrics and Gynecology

## 2017-10-18 DIAGNOSIS — N6001 Solitary cyst of right breast: Secondary | ICD-10-CM | POA: Diagnosis not present

## 2017-10-18 DIAGNOSIS — R928 Other abnormal and inconclusive findings on diagnostic imaging of breast: Secondary | ICD-10-CM

## 2017-10-18 DIAGNOSIS — N6310 Unspecified lump in the right breast, unspecified quadrant: Secondary | ICD-10-CM

## 2017-10-19 ENCOUNTER — Ambulatory Visit: Payer: Medicaid Other

## 2017-10-21 ENCOUNTER — Telehealth: Payer: Self-pay | Admitting: Obstetrics and Gynecology

## 2017-10-21 NOTE — Telephone Encounter (Signed)
OB Telephone Note Patient called at [phone] and went straight to a generic VM. VM left for pt to call back. I called to clarify if a Core needle biopsy was done by radiology at the time of her ultrasound because I couldn't tell from the notes and I don't see anything in pathology. If a CNB wasn't done then one will need to be scheduled.   Cornelia Copaharlie Nekeya Briski, Jr MD Attending [facility] 10/21/2017 Time: [phone]

## 2017-10-26 ENCOUNTER — Ambulatory Visit
Admission: RE | Admit: 2017-10-26 | Discharge: 2017-10-26 | Disposition: A | Discharge status: Home / Self Care | Payer: Medicaid Other | Source: Ambulatory Visit | Attending: Obstetrics and Gynecology | Admitting: Obstetrics and Gynecology

## 2017-10-26 DIAGNOSIS — R928 Other abnormal and inconclusive findings on diagnostic imaging of breast: Secondary | ICD-10-CM

## 2017-10-26 DIAGNOSIS — N6310 Unspecified lump in the right breast, unspecified quadrant: Secondary | ICD-10-CM | POA: Insufficient documentation

## 2017-10-27 ENCOUNTER — Ambulatory Visit (INDEPENDENT_AMBULATORY_CARE_PROVIDER_SITE_OTHER): Payer: Medicaid Other

## 2017-10-27 DIAGNOSIS — O09212 Supervision of pregnancy with history of pre-term labor, second trimester: Secondary | ICD-10-CM | POA: Diagnosis not present

## 2017-10-27 DIAGNOSIS — O0992 Supervision of high risk pregnancy, unspecified, second trimester: Secondary | ICD-10-CM

## 2017-10-27 LAB — SURGICAL PATHOLOGY

## 2017-10-27 MED ORDER — HYDROXYPROGESTERONE CAPROATE 275 MG/1.1ML ~~LOC~~ SOAJ
275.0000 mg | Freq: Once | SUBCUTANEOUS | Status: AC
  Administered 2017-10-27: 275 mg via SUBCUTANEOUS

## 2017-10-27 NOTE — Progress Notes (Signed)
Patient presented to the office today for 17- P injection received in right arm SQ. Patient tolerated well and will follow up next week for next injection.

## 2017-10-31 ENCOUNTER — Telehealth: Payer: Self-pay

## 2017-11-03 ENCOUNTER — Ambulatory Visit (INDEPENDENT_AMBULATORY_CARE_PROVIDER_SITE_OTHER): Payer: Medicaid Other | Admitting: Obstetrics & Gynecology

## 2017-11-03 VITALS — BP 123/78 | HR 78 | Wt 186.9 lb

## 2017-11-03 DIAGNOSIS — R0981 Nasal congestion: Secondary | ICD-10-CM

## 2017-11-03 DIAGNOSIS — O99612 Diseases of the digestive system complicating pregnancy, second trimester: Secondary | ICD-10-CM

## 2017-11-03 DIAGNOSIS — O4402 Placenta previa specified as without hemorrhage, second trimester: Secondary | ICD-10-CM

## 2017-11-03 DIAGNOSIS — O0992 Supervision of high risk pregnancy, unspecified, second trimester: Secondary | ICD-10-CM

## 2017-11-03 DIAGNOSIS — O09212 Supervision of pregnancy with history of pre-term labor, second trimester: Secondary | ICD-10-CM | POA: Diagnosis not present

## 2017-11-03 DIAGNOSIS — K219 Gastro-esophageal reflux disease without esophagitis: Secondary | ICD-10-CM

## 2017-11-03 DIAGNOSIS — O09219 Supervision of pregnancy with history of pre-term labor, unspecified trimester: Secondary | ICD-10-CM

## 2017-11-03 DIAGNOSIS — O9989 Other specified diseases and conditions complicating pregnancy, childbirth and the puerperium: Secondary | ICD-10-CM

## 2017-11-03 DIAGNOSIS — O99891 Other specified diseases and conditions complicating pregnancy: Secondary | ICD-10-CM

## 2017-11-03 MED ORDER — FLUTICASONE PROPIONATE 50 MCG/ACT NA SUSP: 1.0000 | Freq: Every day | NASAL | 2 refills | Status: DC

## 2017-11-03 MED ORDER — FAMOTIDINE 20 MG PO TABS: 20.0000 mg | ORAL_TABLET | Freq: Two times a day (BID) | ORAL | 3 refills | Status: DC

## 2017-11-03 MED ORDER — HYDROXYPROGESTERONE CAPROATE 275 MG/1.1ML ~~LOC~~ SOAJ
275.0000 mg | Freq: Once | SUBCUTANEOUS | Status: AC
  Administered 2017-11-03: 275 mg via SUBCUTANEOUS

## 2017-11-03 NOTE — Patient Instructions (Addendum)
Return to clinic for any scheduled appointments or obstetric concerns, or go to [facility] for evaluation  TDaP Vaccine Pregnancy Get the Whooping Cough Vaccine While You Are Pregnant (CDC)  It is important for women to get the whooping cough vaccine in the third trimester of each pregnancy. Vaccines are the best way to prevent this disease. There are 2 different whooping cough vaccines. Both vaccines combine protection against whooping cough, tetanus and diphtheria, but they are for different age groups: Tdap: for everyone 11 years or older, including pregnant women  DTaP: for children 2 months through 6 years of age  You need the whooping cough vaccine during each of your pregnancies The recommended time to get the shot is during your 27th through 36th week of pregnancy, preferably during the earlier part of this time period. The Centers for Disease Control and Prevention (CDC) recommends that pregnant women receive the whooping cough vaccine for adolescents and adults (called Tdap vaccine) during the third trimester of each pregnancy. The recommended time to get the shot is during your 27th through 36th week of pregnancy, preferably during the earlier part of this time period. This replaces the original recommendation that pregnant women get the vaccine only if they had not previously received it. The American College of Obstetricians and Gynecologists and the American College of Nurse-Midwives support this recommendation.  You should get the whooping cough vaccine while pregnant to pass protection to your baby frame support disabled and/or not supported in this browser  Learn why Laura decided to get the whooping cough vaccine in her 3rd trimester of pregnancy and how her baby girl was born with some protection against the disease. Also available on YouTube. After receiving the whooping cough vaccine, your body will create protective antibodies (proteins produced by the body to fight off diseases) and  pass some of them to your baby before birth. These antibodies provide your baby some short-term protection against whooping cough in early life. These antibodies can also protect your baby from some of the more serious complications that come along with whooping cough. Your protective antibodies are at their highest about 2 weeks after getting the vaccine, but it takes time to pass them to your baby. So the preferred time to get the whooping cough vaccine is early in your third trimester. The amount of whooping cough antibodies in your body decreases over time. That is why CDC recommends you get a whooping cough vaccine during each pregnancy. Doing so allows each of your babies to get the greatest number of protective antibodies from you. This means each of your babies will get the best protection possible against this disease.  Getting the whooping cough vaccine while pregnant is better than getting the vaccine after you give birth Whooping cough vaccination during pregnancy is ideal so your baby will have short-term protection as soon as he is born. This early protection is important because your baby will not start getting his whooping cough vaccines until he is 2 months old. These first few months of life are when your baby is at greatest risk for catching whooping cough. This is also when he's at greatest risk for having severe, potentially life-threating complications from the infection. To avoid that gap in protection, it is best to get a whooping cough vaccine during pregnancy. You will then pass protection to your baby before he is born. To continue protecting your baby, he should get whooping cough vaccines starting at 2 months old. You may never have gotten the   Tdap vaccine before and did not get it during this pregnancy. If so, you should make sure to get the vaccine immediately after you give birth, before leaving the hospital or birthing center. It will take about 2 weeks before your body  develops protection (antibodies) in response to the vaccine. Once you have protection from the vaccine, you are less likely to give whooping cough to your newborn while caring for him. But remember, your baby will still be at risk for catching whooping cough from others. A recent study looked to see how effective Tdap was at preventing whooping cough in babies whose mothers got the vaccine while pregnant or in the hospital after giving birth. The study found that getting Tdap between 27 through 36 weeks of pregnancy is 85% more effective at preventing whooping cough in babies younger than 2 months old. Blood tests cannot tell if you need a whooping cough vaccine There are no blood tests that can tell you if you have enough antibodies in your body to protect yourself or your baby against whooping cough. Even if you have been sick with whooping cough in the past or previously received the vaccine, you still should get the vaccine during each pregnancy. Breastfeeding may pass some protective antibodies onto your baby By breastfeeding, you may pass some antibodies you have made in response to the vaccine to your baby. When you get a whooping cough vaccine during your pregnancy, you will have antibodies in your breast milk that you can share with your baby as soon as your milk comes in. However, your baby will not get protective antibodies immediately if you wait to get the whooping cough vaccine until after delivering your baby. This is because it takes about 2 weeks for your body to create antibodies. Learn more about the health benefits of breastfeeding. 

## 2017-11-03 NOTE — Progress Notes (Signed)
   PRENATAL VISIT NOTE  Subjective:  [name] PollockMelinda Vessel is a 37 y.o. [phone] at 357w5d being seen today for ongoing prenatal care.  She is currently monitored for the following issues for this high-risk pregnancy and has Multinodular goiter; Placenta previa in second trimester; Depression; Supervision of high-risk pregnancy; Nausea and vomiting of pregnancy, antepartum; Previous preterm delivery at 36 weeks, antepartum; History of placental abruption; and Benign cyst of right breast on their problem list.  Patient reports no reflux symptoms and nasal congestions, wants medications.  Contractions: Not present.  .  Movement: Present. Denies leaking of fluid.   The following portions of the patient's history were reviewed and updated as appropriate: allergies, current medications, past family history, past medical history, past social history, past surgical history and problem list. Problem list updated.  Objective:   Vitals:   11/03/17 1113  BP: 123/78  Pulse: 78  Weight: 186 lb 14.4 oz (84.8 kg)    Fetal Status: Fetal Heart Rate (bpm): 143 Fundal Height: 23 cm Movement: Present     General:  Alert, oriented and cooperative. Patient is in no acute distress.  Skin: Skin is warm and dry. No rash noted.   Cardiovascular: Normal heart rate noted  Respiratory: Normal respiratory effort, no problems with respiration noted  Abdomen: Soft, gravid, appropriate for gestational age.  Pain/Pressure: Absent     Pelvic: Cervical exam deferred        Extremities: Normal range of motion.  Edema: None  Mental Status: Normal mood and affect. Normal behavior. Normal judgment and thought content.    Assessment and Plan:  Pregnancy: [id number] at 1557w5d  1. Previous preterm delivery at 36 weeks, antepartum Continue weekly 17P  2. Placenta previa in second trimester Continue previa precautions; repeat scan scheduled 11/11/2017  3. Nasal congestion related to pregnancy - fluticasone (FLONASE) 50 MCG/ACT  nasal spray; Place 1-2 sprays into both nostrils daily for 10 days.  Dispense: 16 g; Refill: 2  4. Gastroesophageal reflux in pregnancy in second trimester - famotidine (PEPCID) 20 MG tablet; Take 1 tablet (20 mg total) by mouth 2 (two) times daily.  Dispense: 60 tablet; Refill: 3  5. Supervision of high risk pregnancy in second trimester Preterm labor symptoms and general obstetric precautions including but not limited to vaginal bleeding, contractions, leaking of fluid and fetal movement were reviewed in detail with the patient. Please refer to After Visit Summary for other counseling recommendations.  Return in about 1 day (around 11/04/2017) for Weekly 17P  4 weeks: 2 hr GTT, 3rd trimester labs, TDap, OB Visit.  Future Appointments  Date Time Provider Department Center  11/11/2017  1:00 PM [facility] US 3 [facility]    Jaynie CollinsUgonna Wanell Lorenzi, MD

## 2017-11-03 NOTE — Progress Notes (Signed)
Refill on flonase  17- p today

## 2017-11-10 ENCOUNTER — Ambulatory Visit: Payer: Medicaid Other

## 2017-11-11 ENCOUNTER — Encounter (HOSPITAL_COMMUNITY): Payer: Self-pay

## 2017-11-11 ENCOUNTER — Other Ambulatory Visit (HOSPITAL_COMMUNITY): Payer: Self-pay | Admitting: Maternal and Fetal Medicine

## 2017-11-11 ENCOUNTER — Other Ambulatory Visit (HOSPITAL_COMMUNITY): Payer: Self-pay | Admitting: *Deleted

## 2017-11-11 ENCOUNTER — Ambulatory Visit (HOSPITAL_COMMUNITY)
Admission: RE | Admit: 2017-11-11 | Discharge: 2017-11-11 | Disposition: A | Discharge status: Home / Self Care | Payer: Medicaid Other | Source: Ambulatory Visit | Attending: Obstetrics & Gynecology | Admitting: Obstetrics & Gynecology

## 2017-11-11 ENCOUNTER — Ambulatory Visit (INDEPENDENT_AMBULATORY_CARE_PROVIDER_SITE_OTHER): Payer: Medicaid Other

## 2017-11-11 VITALS — BP 115/73 | HR 83

## 2017-11-11 DIAGNOSIS — Z3A24 24 weeks gestation of pregnancy: Secondary | ICD-10-CM

## 2017-11-11 DIAGNOSIS — O0992 Supervision of high risk pregnancy, unspecified, second trimester: Secondary | ICD-10-CM

## 2017-11-11 DIAGNOSIS — O4420 Partial placenta previa NOS or without hemorrhage, unspecified trimester: Secondary | ICD-10-CM

## 2017-11-11 DIAGNOSIS — O09212 Supervision of pregnancy with history of pre-term labor, second trimester: Secondary | ICD-10-CM | POA: Diagnosis not present

## 2017-11-11 DIAGNOSIS — O09219 Supervision of pregnancy with history of pre-term labor, unspecified trimester: Secondary | ICD-10-CM

## 2017-11-11 DIAGNOSIS — O4402 Placenta previa specified as without hemorrhage, second trimester: Secondary | ICD-10-CM

## 2017-11-11 DIAGNOSIS — O4400 Placenta previa specified as without hemorrhage, unspecified trimester: Secondary | ICD-10-CM

## 2017-11-11 DIAGNOSIS — O219 Vomiting of pregnancy, unspecified: Secondary | ICD-10-CM

## 2017-11-11 MED ORDER — HYDROXYPROGESTERONE CAPROATE 275 MG/1.1ML ~~LOC~~ SOAJ
275.0000 mg | Freq: Once | SUBCUTANEOUS | Status: AC
  Administered 2017-11-11: 275 mg via SUBCUTANEOUS

## 2017-11-11 NOTE — Progress Notes (Signed)
Pt here for 17p. Inj given in right arm. Pt tolerated well. 

## 2017-11-14 NOTE — Progress Notes (Signed)
I have reviewed this chart and agree with the RN/CMA assessment and management.    Lorenza Winkleman C Thien Berka, MD, FACOG Attending Physician, Faculty Practice [facility]  

## 2017-11-17 ENCOUNTER — Inpatient Hospital Stay
Admission: EM | Admit: 2017-11-17 | Discharge: 2017-11-17 | Disposition: A | Discharge status: Home / Self Care | Payer: Medicaid Other | Attending: Obstetrics and Gynecology | Admitting: Obstetrics and Gynecology

## 2017-11-17 ENCOUNTER — Ambulatory Visit: Payer: Medicaid Other

## 2017-11-17 ENCOUNTER — Other Ambulatory Visit: Payer: Self-pay

## 2017-11-17 ENCOUNTER — Telehealth: Payer: Self-pay

## 2017-11-17 DIAGNOSIS — Z3A25 25 weeks gestation of pregnancy: Secondary | ICD-10-CM | POA: Insufficient documentation

## 2017-11-17 DIAGNOSIS — O219 Vomiting of pregnancy, unspecified: Secondary | ICD-10-CM

## 2017-11-17 DIAGNOSIS — Z7982 Long term (current) use of aspirin: Secondary | ICD-10-CM | POA: Insufficient documentation

## 2017-11-17 DIAGNOSIS — Z79899 Other long term (current) drug therapy: Secondary | ICD-10-CM | POA: Diagnosis not present

## 2017-11-17 DIAGNOSIS — O0992 Supervision of high risk pregnancy, unspecified, second trimester: Secondary | ICD-10-CM

## 2017-11-17 DIAGNOSIS — O212 Late vomiting of pregnancy: Secondary | ICD-10-CM | POA: Insufficient documentation

## 2017-11-17 DIAGNOSIS — O4402 Placenta previa specified as without hemorrhage, second trimester: Secondary | ICD-10-CM

## 2017-11-17 DIAGNOSIS — O09219 Supervision of pregnancy with history of pre-term labor, unspecified trimester: Secondary | ICD-10-CM

## 2017-11-17 LAB — URINALYSIS, COMPLETE (UACMP) WITH MICROSCOPIC
Bilirubin Urine: NEGATIVE
GLUCOSE, UA: NEGATIVE mg/dL
HGB URINE DIPSTICK: NEGATIVE
Ketones, ur: NEGATIVE mg/dL
Nitrite: NEGATIVE
PROTEIN: 30 mg/dL — AB
Specific Gravity, Urine: 1.018 (ref 1.005–1.030)
pH: 6 (ref 5.0–8.0)

## 2017-11-17 LAB — URINE DRUG SCREEN, QUALITATIVE (ARMC ONLY)
Amphetamines, Ur Screen: NOT DETECTED
BARBITURATES, UR SCREEN: NOT DETECTED
CANNABINOID 50 NG, UR ~~LOC~~: POSITIVE — AB
Cocaine Metabolite,Ur ~~LOC~~: NOT DETECTED
MDMA (ECSTASY) UR SCREEN: NOT DETECTED
Methadone Scn, Ur: NOT DETECTED
Opiate, Ur Screen: NOT DETECTED
PHENCYCLIDINE (PCP) UR S: NOT DETECTED
TRICYCLIC, UR SCREEN: NOT DETECTED

## 2017-11-17 MED ORDER — ONDANSETRON HCL 4 MG/2ML IJ SOLN
4.0000 mg | INTRAMUSCULAR | Status: DC | PRN
Start: 2017-11-17 — End: 2017-11-17

## 2017-11-17 NOTE — Discharge Summary (Signed)
L&D OB Triage Note  SUBJECTIVE [name] PollockMelinda Moreno is a 37 y.o. [id number] female at 5352w5d, EDD Estimated Date of Delivery: 02/25/18 who presented to triage with complaints of 12-hour history of nausea and vomiting and several episodes of diarrhea.  Patient also complains of lower abdominal pain. Her history is significant for preterm birth from a placental abruption at 36 weeks and current pregnancy complicated by low-lying placenta.  She denies vaginal bleeding.  OB History  Gravida Para Term Preterm AB Living  3 1 0 1 1 1   SAB TAB Ectopic Multiple Live Births  1 0 0 0 1    # Outcome Date GA Lbr Len/2nd Weight Sex Delivery Anes PTL Lv  3 Current           2 SAB 04/19/17     SAB     1 Preterm 05/29/03 8020w0d    Vag-Spont   LIV     Birth Comments: Had "placental separation" leading to preterm delivery     Complications: Abruptio Placenta    Medications Prior to Admission  Medication Sig Dispense Refill Last Dose  . aspirin EC 81 MG tablet Take 1 tablet (81 mg total) by mouth daily. Take after 12 weeks for prevention of preeclampsia later in pregnancy 300 tablet 2 11/16/2017 at Unknown time  . Doxylamine-Pyridoxine ER (BONJESTA) 20-20 MG TBCR Take 1 tablet by mouth at bedtime. May add 1 tab midday if needed 60 tablet 6 11/16/2017 at Unknown time  . famotidine (PEPCID) 20 MG tablet Take 1 tablet (20 mg total) by mouth 2 (two) times daily. 60 tablet 3 11/16/2017 at Unknown time  . Prenatal Vit-Fe Fumarate-FA (PREPLUS) 27-1 MG TABS Take 1 tablet by mouth daily. 30 tablet 13 11/16/2017 at Unknown time  . fluticasone (FLONASE) 50 MCG/ACT nasal spray Place 1-2 sprays into both nostrils daily for 10 days. 16 g 2 Taking  . metoCLOPramide (REGLAN) 10 MG tablet Take 1 tablet (10 mg total) by mouth every 8 (eight) hours as needed for up to 3 days for nausea. 20 tablet 0 Taking  . ondansetron (ZOFRAN) 4 MG tablet Take 1 tablet (4 mg total) by mouth daily as needed for nausea or vomiting. (Patient not  taking: Reported on 11/17/2017) 20 tablet 1 Not Taking at Unknown time  . promethazine (PHENERGAN) 25 MG suppository Place 1 suppository (25 mg total) rectally every 6 (six) hours as needed for nausea. (Patient not taking: Reported on 11/11/2017) 12 suppository 1 Not Taking     OBJECTIVE  Nursing Evaluation:   BP 120/64 (BP Location: Right Arm)   Pulse 83   Temp 97.9 F (36.6 C) (Oral)   Resp 14   Ht 5\' 3"  (1.6 m)   Wt 191 lb (86.6 kg)   LMP 05/21/2017 (Exact Date)   BMI 33.83 kg/m    Findings:   UA reveals no evidence of dehydration and no evidence of UTI. No contractions noted on external fetal monitor Patient evidence no vomiting during her stay in labor and delivery.  NST was performed and has been reviewed by me.  NST INTERPRETATION:  appropriate for gestational age  Mode: External Baseline Rate (A): 140 bpm Variability: Moderate Accelerations: None Decelerations: None     Contraction Frequency (min): none noted  ASSESSMENT Impression:  1.  Pregnancy:  [id number] at 5552w5d , EDD Estimated Date of Delivery: 02/25/18 2.  NST: Appropriate for gestational age  PLAN 1. Reassurance given 2. Discharge home with standard labor precautions given to return to  L&D or call the office for problems. 3. Continue routine prenatal care at [facility] with her regular provider.

## 2017-11-17 NOTE — OB Triage Note (Signed)
Pt [id number] 7737w5d complains of pain in lower abdomen, leg pain, and pt states she feels really weak when she stands. Pt denies LOF, vaginal bleeding, and states + FM. VSS. Monitors applied and assessing.

## 2017-11-17 NOTE — Telephone Encounter (Signed)
Received called from patient regarding her vomiting and diarrhea since 4 am. She is not able to keep anything down. Patient reports this happened two days ago.Patient advised to start a clear liqurd diet. (BRAT) and to take small sips of fluids. If she is not able tolerated keeping clear fluids down she should report to MAU to be seen. Patient voice understanding at this time.

## 2017-11-17 NOTE — Progress Notes (Signed)
Family member looking for patient.  Advised him to go to the 3rd floor.

## 2017-11-22 ENCOUNTER — Ambulatory Visit (INDEPENDENT_AMBULATORY_CARE_PROVIDER_SITE_OTHER): Payer: Medicaid Other

## 2017-11-22 VITALS — BP 106/72 | HR 72

## 2017-11-22 DIAGNOSIS — O0990 Supervision of high risk pregnancy, unspecified, unspecified trimester: Secondary | ICD-10-CM

## 2017-11-22 DIAGNOSIS — O09212 Supervision of pregnancy with history of pre-term labor, second trimester: Secondary | ICD-10-CM

## 2017-11-22 MED ORDER — HYDROXYPROGESTERONE CAPROATE 275 MG/1.1ML ~~LOC~~ SOAJ
275.0000 mg | Freq: Once | SUBCUTANEOUS | Status: AC
  Administered 2017-11-22: 275 mg via SUBCUTANEOUS

## 2017-11-22 NOTE — Progress Notes (Signed)
Patient seen and assessed by nursing staff.  Agree with documentation and plan.  

## 2017-11-22 NOTE — Progress Notes (Signed)
Patient presented to the office today for her 17-p injection received in her left arm sq. Patient tolerated well and will follow up in one week.

## 2017-11-24 ENCOUNTER — Ambulatory Visit: Payer: Medicaid Other

## 2017-12-01 ENCOUNTER — Ambulatory Visit (INDEPENDENT_AMBULATORY_CARE_PROVIDER_SITE_OTHER): Payer: Medicaid Other | Admitting: Family Medicine

## 2017-12-01 VITALS — BP 139/85 | HR 72 | Wt 190.5 lb

## 2017-12-01 DIAGNOSIS — O09212 Supervision of pregnancy with history of pre-term labor, second trimester: Secondary | ICD-10-CM | POA: Diagnosis not present

## 2017-12-01 DIAGNOSIS — O0992 Supervision of high risk pregnancy, unspecified, second trimester: Secondary | ICD-10-CM

## 2017-12-01 DIAGNOSIS — O09219 Supervision of pregnancy with history of pre-term labor, unspecified trimester: Secondary | ICD-10-CM

## 2017-12-01 DIAGNOSIS — O4402 Placenta previa specified as without hemorrhage, second trimester: Secondary | ICD-10-CM

## 2017-12-01 DIAGNOSIS — Z23 Encounter for immunization: Secondary | ICD-10-CM | POA: Diagnosis not present

## 2017-12-01 DIAGNOSIS — O219 Vomiting of pregnancy, unspecified: Secondary | ICD-10-CM

## 2017-12-01 MED ORDER — HYDROXYPROGESTERONE CAPROATE 275 MG/1.1ML ~~LOC~~ SOAJ
275.0000 mg | Freq: Once | SUBCUTANEOUS | Status: AC
  Administered 2017-12-01: 275 mg via SUBCUTANEOUS

## 2017-12-01 NOTE — Progress Notes (Signed)
    PRENATAL VISIT NOTE  Subjective:  [name] Moreno is a 37 y.o. [phone] at 2370w5d being seen today for ongoing prenatal care.  She is currently monitored for the following issues for this high-risk pregnancy and has Multinodular goiter; Placenta previa in second trimester; Depression; Supervision of high-risk pregnancy; Nausea and vomiting of pregnancy, antepartum; Previous preterm delivery at 36 weeks, antepartum; History of placental abruption; and Benign cyst of right breast on their problem list.  Patient reports heartburn, nausea and vomiting.   .  .  Movement: Present. Denies leaking of fluid.   The following portions of the patient's history were reviewed and updated as appropriate: allergies, current medications, past family history, past medical history, past social history, past surgical history and problem list. Problem list updated.  Objective:   Vitals:   12/01/17 0919  BP: 139/85  Pulse: 72  Weight: 190 lb 8 oz (86.4 kg)    Fetal Status: Fetal Heart Rate (bpm): 143 Fundal Height: 27 cm Movement: Present     General:  Alert, oriented and cooperative. Patient is in no acute distress.  Skin: Skin is warm and dry. No rash noted.   Cardiovascular: Normal heart rate noted  Respiratory: Normal respiratory effort, no problems with respiration noted  Abdomen: Soft, gravid, appropriate for gestational age.  Pain/Pressure: Absent     Pelvic: Cervical exam deferred        Extremities: Normal range of motion.  Edema: None  Mental Status: Normal mood and affect. Normal behavior. Normal judgment and thought content.   Assessment and Plan:  Pregnancy: [id number] at 3170w5d  1. Supervision of high risk pregnancy, antepartum, second trimester 28 wk labs TDaP and flu today - RPR - HIV antibody - CBC - Glucose tolerance, 2 hours  2. Previous preterm delivery at 36 weeks, antepartum Continue weekly 17 P  3. Nausea and vomiting of pregnancy, antepartum Continue bonjesta, reglan,  pepcid  5. Placenta previa in second trimester No bleeding, f/u u/s to check location scheduled in September.  Preterm labor symptoms and general obstetric precautions including but not limited to vaginal bleeding, contractions, leaking of fluid and fetal movement were reviewed in detail with the patient. Please refer to After Visit Summary for other counseling recommendations.  Return in 2 weeks (on 12/15/2017).  Future Appointments  Date Time Provider Department Center  12/23/2017  2:15 PM [facility] US 4 [facility]    Reva Boresanya S Jodie Cavey, MD

## 2017-12-01 NOTE — Patient Instructions (Signed)
Informacin sobre la prueba de Elberttrabajo de parto despus de Neomia Dearuna cesrea (Trial of Labor After Cesarean Delivery Information) La prueba de [place] Fallstrabajo de parto despus de una cesrea (TOLAC por sus siglas en ingls) es cuando una mujer trata de dar a luz por va vaginal despus de una cesrea anterior. La TOLAC puede ser Neomia Dearuna opcin segura y [place] Islandsadecuada segn la historia clnica y otros factores de Folsomriesgo. Cuando TOLAC tiene xito y es capaz de tener un parto vaginal, esto se llama un parto vaginal despus de Neomia Dearuna cesrea (PVDC).  CANDIDATAS PARA TOLAC Este procedimiento es posible para algunas mujeres que:  Hayan sido sometidas a uno o dos partos por cesrea en los que la incisin del tero fue horizontal (transversal baja).  Estn esperando gemelos y tuvieron una incisin transversal baja durante una cesrea.  No tienen una cicatriz uterina vertical (clsica).  No han tenido una ruptura en la pared del tero (ruptura uterina). El TOLAC tambin puede intentarse en mujeres que cumplen con los criterios apropiados:  Son menores de [address] aos.  Son altas y tienen un ndice de masa corporal [facility] inferior a 30.  . Fredderick Phenixienen una cicatriz uterina desconocida.  Dan a luz en un centro equipado para realizar un parto por cesrea de emergencia. Este equipo debe ser capaz de Company secretarymanejar las posibles complicaciones, como una ruptura uterina.  Tienen asesoramiento completo United Stationerssobre los beneficios y riesgos del TOLAC.  Han comentado acerca de futuros planes de embarazo con su mdico.  Han planificado tener varios embarazos ms. CANDIDATAS A TENER MS XITO EN TOLAC:  Han tenido un parto vaginal exitoso antes o despus de su parto por cesrea.  Experimentan un trabajo de parto que comienza, naturalmente, en o antes de la fecha estimada (40 semanas de gestacin).  El beb no es muy grande ( macrosmico).   Han tenido un parto por cesrea anterior, pero actualmente no hay factores que promoveran un parto por cesrea  (como una posicin de Mullennalgas).  Han tenido un solo parto por cesrea anteriormente.  Tuvo un parto por cesrea antes de realizar el McIntoshtrabajo de parto y no despus de Teacher, English as a foreign languagela dilatacin completa. TOLAC puede ser ms apropiado para mujeres que cumplen con las normas anteriores y que planean tener ms embarazos. No se recomienda en partos domiciliarios. CANDIDATAS A TENER MENOS XITO EN TOLAC:  Tienen un parto inducido con un cuello uterino desfavorable. Un cuello uterino desfavorable es cuando no se dilata lo suficiente (entre otros factores).  Nunca han tenido un parto vaginal.  Han tenido ms de dos partos por cesrea.  Tienen un embarazo de ms de 40 semanas de gestacin.  Est embarazada de un un beb con sospecha de un peso mayor de 4.000 gramos (8  libras) y no tiene antecedentes de un parto vaginal.  Han tenido embarazos muy cercanos. BENEFICIOS SUGERIDOS DE LA TOLAC  Tiempo de recuperacin ms rpido.  Permanencia ms breve en el hospital.  Menos dolor y problemas que en un parto por cesrea. Las AK Steel Holding Corporationmujeres que tienen un parto por cesrea tienen una mayor probabilidad de necesitar sangre o tener fiebre, una infeccin o un cogulo sanguneo en las piernas. RIESGOS SUGERIDOS DE LA TOLAC El riesgo ms alto de complicaciones ocurre en mujeres que intentan un TOLAC y fracasan. Una TOLAC que fracasa resulta en una cesrea no planificada. Los riesgos relacionados con TOLAC o cesreas repetidas son:   Lulu Ridingrdida de sangre.  Infeccin.  Cogulos sanguneos.  Lesiones en los rganos o tejidos circundantes.  Menor riesgo de  remocin del tero (histerectoma).  Posibles problemas con la placenta (como placenta previa o placenta acreta) en embarazos futuros. Aunque es muy raro, las preocupaciones principales con el TOLAC son:  Ruptura de la cicatriz uterina de una cesrea anterior.  Necesidad de una cesrea de Associate Professoremergencia.  Tener un mal resultado para el beb (morbilidad  perinatal). Irven ShellingPARA OBTENER MS INFORMACIN [employer] Corporationmerican College of Obstetricians and Gynecologists (Colegio Estadounidense de Obstetras y Gineclogos): www.acog.org [employer] Corporationmerican College of Nurse-Midwives (Colegio Estadounidense de Enfermeras - parteras): www.midwife.org Esta informacin no tiene Theme park managercomo fin reemplazar el consejo del mdico. Asegrese de hacerle al mdico cualquier pregunta que tenga. Document Released: 03/25/2011 Document Revised: 01/24/2013 Elsevier Interactive Patient Education  2017 [employer]

## 2017-12-01 NOTE — Progress Notes (Signed)
  Tdap today Makena today

## 2017-12-01 NOTE — Addendum Note (Signed)
Addended by: Cheree DittoGRAHAM, DEMETRICE A on: 12/01/2017 04:50 PM   Modules accepted: Orders

## 2017-12-01 NOTE — Addendum Note (Signed)
Addended by: Cheree DittoGRAHAM, Idrissa [place]eville A on: 12/01/2017 04:59 PM   Modules accepted: Orders

## 2017-12-02 LAB — CBC
HEMATOCRIT: 31.7 % — AB (ref 34.0–46.6)
Hemoglobin: 9.9 g/dL — ABNORMAL LOW (ref 11.1–15.9)
MCH: 24.5 pg — ABNORMAL LOW (ref 26.6–33.0)
MCHC: 31.2 g/dL — ABNORMAL LOW (ref 31.5–35.7)
MCV: 79 fL (ref 79–97)
PLATELETS: 197 10*3/uL (ref 150–450)
RBC: 4.04 x10E6/uL (ref 3.77–5.28)
RDW: 16.4 % — ABNORMAL HIGH (ref 12.3–15.4)
WBC: 6.7 10*3/uL (ref 3.4–10.8)

## 2017-12-02 LAB — RPR: RPR Ser Ql: NONREACTIVE

## 2017-12-02 LAB — HIV ANTIBODY (ROUTINE TESTING W REFLEX): HIV Screen 4th Generation wRfx: NONREACTIVE

## 2017-12-02 LAB — GLUCOSE TOLERANCE, 2 HOURS W/ 1HR
GLUCOSE, FASTING: 84 mg/dL (ref 65–91)
Glucose, 1 hour: 188 mg/dL — ABNORMAL HIGH (ref 65–179)
Glucose, 2 hour: 118 mg/dL (ref 65–152)

## 2017-12-03 ENCOUNTER — Encounter: Payer: Self-pay | Admitting: Family Medicine

## 2017-12-03 DIAGNOSIS — Z8632 Personal history of gestational diabetes: Secondary | ICD-10-CM | POA: Insufficient documentation

## 2017-12-05 ENCOUNTER — Telehealth: Payer: Self-pay

## 2017-12-05 DIAGNOSIS — O24419 Gestational diabetes mellitus in pregnancy, unspecified control: Secondary | ICD-10-CM

## 2017-12-05 NOTE — Telephone Encounter (Signed)
-----   Message from Reva Boresanya S Pratt, MD sent at 12/03/2017  9:00 AM EDT ----- Has GDM--arrange for D and N management please--asap

## 2017-12-08 ENCOUNTER — Other Ambulatory Visit: Payer: Medicaid Other

## 2017-12-14 ENCOUNTER — Ambulatory Visit: Payer: Medicaid Other | Admitting: Registered"

## 2017-12-15 ENCOUNTER — Ambulatory Visit (INDEPENDENT_AMBULATORY_CARE_PROVIDER_SITE_OTHER): Payer: Medicaid Other | Admitting: Obstetrics and Gynecology

## 2017-12-15 VITALS — BP 133/80 | HR 76 | Wt 191.0 lb

## 2017-12-15 DIAGNOSIS — O09213 Supervision of pregnancy with history of pre-term labor, third trimester: Secondary | ICD-10-CM

## 2017-12-15 DIAGNOSIS — O0993 Supervision of high risk pregnancy, unspecified, third trimester: Secondary | ICD-10-CM

## 2017-12-15 DIAGNOSIS — O0991 Supervision of high risk pregnancy, unspecified, first trimester: Secondary | ICD-10-CM

## 2017-12-15 DIAGNOSIS — O4402 Placenta previa specified as without hemorrhage, second trimester: Secondary | ICD-10-CM

## 2017-12-15 DIAGNOSIS — O4403 Placenta previa specified as without hemorrhage, third trimester: Secondary | ICD-10-CM

## 2017-12-15 DIAGNOSIS — O219 Vomiting of pregnancy, unspecified: Secondary | ICD-10-CM

## 2017-12-15 DIAGNOSIS — O09219 Supervision of pregnancy with history of pre-term labor, unspecified trimester: Secondary | ICD-10-CM

## 2017-12-15 DIAGNOSIS — O24419 Gestational diabetes mellitus in pregnancy, unspecified control: Secondary | ICD-10-CM

## 2017-12-15 MED ORDER — HYDROXYPROGESTERONE CAPROATE 275 MG/1.1ML ~~LOC~~ SOAJ
275.0000 mg | Freq: Once | SUBCUTANEOUS | Status: AC
  Administered 2017-12-15: 275 mg via SUBCUTANEOUS

## 2017-12-15 NOTE — Progress Notes (Signed)
Prenatal Visit Note Date: 12/15/2017 Clinic: [facility]  Subjective:  [name] Moreno is a 37 y.o. [id number] at 8270w5d being seen today for ongoing prenatal care.  She is currently monitored for the following issues for this high-risk pregnancy and has Multinodular goiter; Placenta previa in second trimester; Depression; Supervision of high-risk pregnancy; Nausea and vomiting of pregnancy, antepartum; Previous preterm delivery at 36 weeks, antepartum; History of placental abruption; Benign cyst of right breast; and Gestational diabetes on their problem list.  Patient reports see RN nte.   Contractions: Not present. Vag. Bleeding: None.  Movement: Present. Denies leaking of fluid.   The following portions of the patient's history were reviewed and updated as appropriate: allergies, current medications, past family history, past medical history, past social history, past surgical history and problem list. Problem list updated.  Objective:   Vitals:   12/15/17 1033  BP: 133/80  Pulse: 76  Weight: 191 lb (86.6 kg)    Fetal Status: Fetal Heart Rate (bpm): 148   Movement: Present     General:  Alert, oriented and cooperative. Patient is in no acute distress.  Skin: Skin is warm and dry. No rash noted.   Cardiovascular: Normal heart rate noted  Respiratory: Normal respiratory effort, no problems with respiration noted  Abdomen: Soft, gravid, appropriate for gestational age. Pain/Pressure: Present     Pelvic:  Cervical exam deferred        Extremities: Normal range of motion.  Edema: Trace  Mental Status: Normal mood and affect. Normal behavior. Normal judgment and thought content.   Urinalysis:      Assessment and Plan:  Pregnancy: [id number] at 570w5d  1. Previous preterm delivery at 36 weeks, antepartum 17p today and continue qwk - HYDROXYprogesterone Caproate SOAJ 275 mg  2. Nausea and vomiting of pregnancy, antepartum bonjest refill sent  3. Gestational  diabetes mellitus (GDM), antepartum, gestational diabetes method of control unspecified Has teaching next week  4. Supervision of high risk pregnancy in third trimester Routine care. D/w pt re: BC nv. Potassium recommended for cramps  5. Placenta previa in second trimester Has rpt u/s already ordered   Preterm labor symptoms and general obstetric precautions including but not limited to vaginal bleeding, contractions, leaking of fluid and fetal movement were reviewed in detail with the patient. Please refer to After Visit Summary for other counseling recommendations.  Return in about 1 week (around 12/22/2017) for qwk 17p. 2wk rob.   [place] BingPickens, Zakiah Beckerman, MD

## 2017-12-15 NOTE — Progress Notes (Signed)
Patient reports bilateral leg cramps that comes at night for the past 3 nights.

## 2017-12-20 ENCOUNTER — Telehealth: Payer: Self-pay

## 2017-12-20 NOTE — Telephone Encounter (Signed)
Call patient to advise her of a missed appointment. No answer or voice mail to leave a message.

## 2017-12-20 NOTE — Telephone Encounter (Signed)
-----   Message from Reva Bores, MD sent at 12/14/2017 11:31 AM EDT ----- Please call and try to get pt. To reschedule ----- Message ----- From: Carolan Shiver, RD Sent: 12/14/2017  11:29 AM EDT To: Reva Bores, MD

## 2017-12-21 ENCOUNTER — Telehealth: Payer: Self-pay

## 2017-12-21 ENCOUNTER — Encounter: Payer: Medicaid Other | Attending: Family Medicine | Admitting: Registered"

## 2017-12-21 DIAGNOSIS — O24419 Gestational diabetes mellitus in pregnancy, unspecified control: Secondary | ICD-10-CM | POA: Insufficient documentation

## 2017-12-21 DIAGNOSIS — Z713 Dietary counseling and surveillance: Secondary | ICD-10-CM | POA: Insufficient documentation

## 2017-12-21 DIAGNOSIS — O219 Vomiting of pregnancy, unspecified: Secondary | ICD-10-CM

## 2017-12-21 DIAGNOSIS — O99810 Abnormal glucose complicating pregnancy: Secondary | ICD-10-CM

## 2017-12-21 DIAGNOSIS — O0991 Supervision of high risk pregnancy, unspecified, first trimester: Secondary | ICD-10-CM

## 2017-12-21 MED ORDER — PREPLUS 27-1 MG PO TABS
1.0000 | ORAL_TABLET | Freq: Every day | ORAL | 13 refills | Status: DC
Start: 2017-12-21 — End: 2018-01-27

## 2017-12-21 MED ORDER — DOXYLAMINE-PYRIDOXINE ER 20-20 MG PO TBCR: 1.0000 | EXTENDED_RELEASE_TABLET | Freq: Every day | ORAL | 6 refills | Status: DC

## 2017-12-21 NOTE — Telephone Encounter (Signed)
Patient called in requesting supplies for new meter she was given at Diabetes and Nutrition [facility]. Patient reports she was given an Accu chek glucometer and needs lancets and strips.   Patient reports she also needs a medication refills on Bonjesta and PrePlus sent to [facility] [address] [place], [place].  Called patient back - LMOVMTC with exact kind of Accu Chek she was given at the class.  Medication refills on Bonjesta and PrePlus electronically sent to pharmacy.

## 2017-12-22 ENCOUNTER — Other Ambulatory Visit: Payer: Self-pay

## 2017-12-22 ENCOUNTER — Ambulatory Visit: Payer: Medicaid Other

## 2017-12-22 MED ORDER — GLUCOSE BLOOD VI STRP: ORAL_STRIP | 12 refills | Status: DC

## 2017-12-22 MED ORDER — ACCU-CHEK AVIVA PLUS W/DEVICE KIT: 1.0000 | PACK | Freq: Once | 0 refills | Status: AC

## 2017-12-22 MED ORDER — ACCU-CHEK FASTCLIX LANCETS MISC: 12 refills | Status: DC

## 2017-12-23 ENCOUNTER — Encounter (HOSPITAL_COMMUNITY): Payer: Self-pay

## 2017-12-23 ENCOUNTER — Ambulatory Visit (HOSPITAL_COMMUNITY)
Admission: RE | Admit: 2017-12-23 | Discharge: 2017-12-23 | Disposition: A | Discharge status: Home / Self Care | Payer: Medicaid Other | Source: Ambulatory Visit | Attending: Obstetrics & Gynecology | Admitting: Obstetrics & Gynecology

## 2017-12-23 DIAGNOSIS — Z3A30 30 weeks gestation of pregnancy: Secondary | ICD-10-CM | POA: Insufficient documentation

## 2017-12-23 DIAGNOSIS — O4403 Placenta previa specified as without hemorrhage, third trimester: Secondary | ICD-10-CM | POA: Diagnosis present

## 2017-12-23 DIAGNOSIS — O2693 Pregnancy related conditions, unspecified, third trimester: Secondary | ICD-10-CM | POA: Diagnosis not present

## 2017-12-23 DIAGNOSIS — O99283 Endocrine, nutritional and metabolic diseases complicating pregnancy, third trimester: Secondary | ICD-10-CM | POA: Diagnosis not present

## 2017-12-23 DIAGNOSIS — O4402 Placenta previa specified as without hemorrhage, second trimester: Secondary | ICD-10-CM

## 2017-12-23 DIAGNOSIS — E079 Disorder of thyroid, unspecified: Secondary | ICD-10-CM | POA: Insufficient documentation

## 2017-12-23 DIAGNOSIS — O4400 Placenta previa specified as without hemorrhage, unspecified trimester: Secondary | ICD-10-CM

## 2017-12-23 DIAGNOSIS — O09219 Supervision of pregnancy with history of pre-term labor, unspecified trimester: Secondary | ICD-10-CM

## 2017-12-23 DIAGNOSIS — O0993 Supervision of high risk pregnancy, unspecified, third trimester: Secondary | ICD-10-CM

## 2017-12-23 DIAGNOSIS — O24419 Gestational diabetes mellitus in pregnancy, unspecified control: Secondary | ICD-10-CM

## 2017-12-23 DIAGNOSIS — O09213 Supervision of pregnancy with history of pre-term labor, third trimester: Secondary | ICD-10-CM | POA: Diagnosis not present

## 2017-12-23 DIAGNOSIS — O219 Vomiting of pregnancy, unspecified: Secondary | ICD-10-CM

## 2017-12-26 ENCOUNTER — Other Ambulatory Visit (HOSPITAL_COMMUNITY): Payer: Self-pay | Admitting: *Deleted

## 2017-12-26 DIAGNOSIS — O24410 Gestational diabetes mellitus in pregnancy, diet controlled: Secondary | ICD-10-CM

## 2017-12-27 ENCOUNTER — Encounter: Payer: Self-pay | Admitting: Registered"

## 2017-12-27 ENCOUNTER — Telehealth: Payer: Self-pay

## 2017-12-27 NOTE — Telephone Encounter (Signed)
Received call from patient stating she is very sick with high fever,chills, peeling a lot. I have advised patient to go to [facility] to be check out since we do not have any appointments at this time. Patient voice understanding.

## 2017-12-27 NOTE — Progress Notes (Signed)
Patient was seen on 12/21/2017 for Gestational Diabetes self-management class [facility]. The following learning objectives were met by the patient during this course:   States the definition of Gestational Diabetes  States why dietary management is important in controlling blood glucose  Describes the effects each nutrient has on blood glucose levels  Demonstrates ability to create a balanced meal plan  Demonstrates carbohydrate counting   States when to check blood glucose levels  Demonstrates proper blood glucose monitoring techniques  States the effect of stress and exercise on blood glucose levels  States the importance of limiting caffeine and abstaining from alcohol and smoking  Blood glucose monitor given: Accu Chek Guide Lot # [id number] Exp: 08/27/18 Blood glucose reading: 85 mg/dL  Patient instructed to monitor glucose levels: FBS: 60 - <95; 1 hour: <140; 2 hour: <120  Patient received handouts:  Nutrition Diabetes and Pregnancy, including carb counting list  Patient will be seen for follow-up as needed.

## 2017-12-27 NOTE — Telephone Encounter (Signed)
-----   Message from Lindell Spar, [place] sent at 12/27/2017 10:05 AM EDT ----- Regarding: patient has a question  Please call patient, she states that she is sick and wanting to speak with someone about if she needs to be seen or not

## 2017-12-29 ENCOUNTER — Ambulatory Visit (INDEPENDENT_AMBULATORY_CARE_PROVIDER_SITE_OTHER): Payer: Medicaid Other | Admitting: Obstetrics and Gynecology

## 2017-12-29 VITALS — BP 127/78 | HR 97 | Wt 190.0 lb

## 2017-12-29 DIAGNOSIS — O24410 Gestational diabetes mellitus in pregnancy, diet controlled: Secondary | ICD-10-CM | POA: Diagnosis not present

## 2017-12-29 LAB — OB RESULTS CONSOLE GBS: STREP GROUP B AG: POSITIVE

## 2017-12-29 LAB — GLUCOSE, POCT (MANUAL RESULT ENTRY): POC GLUCOSE: 104 mg/dL — AB (ref 70–99)

## 2017-12-29 MED ORDER — HYDROXYPROGESTERONE CAPROATE 275 MG/1.1ML ~~LOC~~ SOAJ
275.0000 mg | Freq: Once | SUBCUTANEOUS | Status: AC
  Administered 2017-12-29: 275 mg via SUBCUTANEOUS

## 2017-12-29 NOTE — Progress Notes (Signed)
cbg 104

## 2018-01-02 ENCOUNTER — Encounter: Payer: Self-pay | Admitting: Obstetrics and Gynecology

## 2018-01-02 ENCOUNTER — Other Ambulatory Visit: Payer: Self-pay

## 2018-01-02 DIAGNOSIS — R8271 Bacteriuria: Secondary | ICD-10-CM | POA: Insufficient documentation

## 2018-01-02 LAB — URINE CULTURE, OB REFLEX

## 2018-01-02 LAB — CULTURE, OB URINE

## 2018-01-02 MED ORDER — NITROFURANTOIN MONOHYD MACRO 100 MG PO CAPS: 100.0000 mg | ORAL_CAPSULE | Freq: Two times a day (BID) | ORAL | 0 refills | Status: DC

## 2018-01-02 NOTE — Telephone Encounter (Signed)
Patient has a uti infection. Macrobid has been called into her pharmacy.

## 2018-01-02 NOTE — Progress Notes (Signed)
Prenatal Visit Note Date: 12/29/2017 Clinic: [facility] Subjective:  [name] PollockMelinda Clinch is a 37 y.o. [id number] at 426w5d being seen today for ongoing prenatal care.  She is currently monitored for the following issues for this high-risk pregnancy and has Multinodular goiter; Depression; Supervision of high-risk pregnancy; Nausea and vomiting of pregnancy, antepartum; Previous preterm delivery at 36 weeks, antepartum; History of placental abruption; Benign cyst of right breast; and Gestational diabetes on their problem list.  Patient reports no complaints.   Contractions: Not present. Vag. Bleeding: None.  Movement: Present. Denies leaking of fluid.   The following portions of the patient's history were reviewed and updated as appropriate: allergies, current medications, past family history, past medical history, past social history, past surgical history and problem list. Problem list updated.  Objective:   Vitals:   12/29/17 1127  BP: 127/78  Pulse: 97  Weight: 190 lb (86.2 kg)    Fetal Status: Fetal Heart Rate (bpm): 138 Fundal Height: 30 cm Movement: Present     General:  Alert, oriented and cooperative. Patient is in no acute distress.  Skin: Skin is warm and dry. No rash noted.   Cardiovascular: Normal heart rate noted  Respiratory: Normal respiratory effort, no problems with respiration noted  Abdomen: Soft, gravid, appropriate for gestational age. Pain/Pressure: Present     Pelvic:  Cervical exam deferred        Extremities: Normal range of motion.  Edema: None  Mental Status: Normal mood and affect. Normal behavior. Normal judgment and thought content.   Urinalysis:      Assessment and Plan:  Pregnancy: [id number] at 296w5d  1. Diet controlled gestational diabetes mellitus (GDM), antepartum Not checking as often as needed but gives normal numbers. 2hr PP is normal today. Follow up nv and stressed importance - POCT Glucose (CBG) - Urine Culture - Culture,  OB Urine  2. H/o PTB 17p today, continue qwk   Preterm labor symptoms and general obstetric precautions including but not limited to vaginal bleeding, contractions, leaking of fluid and fetal movement were reviewed in detail with the patient. Please refer to After Visit Summary for other counseling recommendations.  Return in about 1 week (around 01/05/2018) for 17p and 2wk 17p and rob.   [place] BingPickens, Keslie Gritz, MD

## 2018-01-05 ENCOUNTER — Telehealth: Payer: Self-pay | Admitting: Radiology

## 2018-01-05 ENCOUNTER — Encounter (HOSPITAL_COMMUNITY): Payer: Self-pay

## 2018-01-05 ENCOUNTER — Inpatient Hospital Stay (HOSPITAL_COMMUNITY)
Admission: AD | Admit: 2018-01-05 | Discharge: 2018-01-05 | Disposition: A | Discharge status: Home / Self Care | Payer: Medicaid Other | Source: Ambulatory Visit | Attending: Obstetrics & Gynecology | Admitting: Obstetrics & Gynecology

## 2018-01-05 ENCOUNTER — Other Ambulatory Visit: Payer: Self-pay | Admitting: Advanced Practice Midwife

## 2018-01-05 ENCOUNTER — Inpatient Hospital Stay (HOSPITAL_COMMUNITY): Payer: Medicaid Other

## 2018-01-05 ENCOUNTER — Ambulatory Visit: Payer: Medicaid Other

## 2018-01-05 ENCOUNTER — Inpatient Hospital Stay (HOSPITAL_BASED_OUTPATIENT_CLINIC_OR_DEPARTMENT_OTHER): Payer: Medicaid Other

## 2018-01-05 DIAGNOSIS — O99343 Other mental disorders complicating pregnancy, third trimester: Secondary | ICD-10-CM | POA: Diagnosis not present

## 2018-01-05 DIAGNOSIS — E042 Nontoxic multinodular goiter: Secondary | ICD-10-CM | POA: Diagnosis not present

## 2018-01-05 DIAGNOSIS — Z3A32 32 weeks gestation of pregnancy: Secondary | ICD-10-CM

## 2018-01-05 DIAGNOSIS — Z79899 Other long term (current) drug therapy: Secondary | ICD-10-CM | POA: Insufficient documentation

## 2018-01-05 DIAGNOSIS — Z87891 Personal history of nicotine dependence: Secondary | ICD-10-CM | POA: Insufficient documentation

## 2018-01-05 DIAGNOSIS — O4403 Placenta previa specified as without hemorrhage, third trimester: Secondary | ICD-10-CM

## 2018-01-05 DIAGNOSIS — O09213 Supervision of pregnancy with history of pre-term labor, third trimester: Secondary | ICD-10-CM

## 2018-01-05 DIAGNOSIS — F329 Major depressive disorder, single episode, unspecified: Secondary | ICD-10-CM | POA: Insufficient documentation

## 2018-01-05 DIAGNOSIS — Z7982 Long term (current) use of aspirin: Secondary | ICD-10-CM | POA: Diagnosis not present

## 2018-01-05 DIAGNOSIS — O4693 Antepartum hemorrhage, unspecified, third trimester: Secondary | ICD-10-CM | POA: Diagnosis not present

## 2018-01-05 DIAGNOSIS — O99283 Endocrine, nutritional and metabolic diseases complicating pregnancy, third trimester: Secondary | ICD-10-CM

## 2018-01-05 DIAGNOSIS — E079 Disorder of thyroid, unspecified: Secondary | ICD-10-CM | POA: Diagnosis not present

## 2018-01-05 DIAGNOSIS — Z3689 Encounter for other specified antenatal screening: Secondary | ICD-10-CM

## 2018-01-05 DIAGNOSIS — O2693 Pregnancy related conditions, unspecified, third trimester: Secondary | ICD-10-CM

## 2018-01-05 DIAGNOSIS — N939 Abnormal uterine and vaginal bleeding, unspecified: Secondary | ICD-10-CM | POA: Diagnosis present

## 2018-01-05 LAB — CBC
HCT: 30.8 % — ABNORMAL LOW (ref 36.0–46.0)
HEMOGLOBIN: 10.2 g/dL — AB (ref 12.0–15.0)
MCH: 24.6 pg — ABNORMAL LOW (ref 26.0–34.0)
MCHC: 33.1 g/dL (ref 30.0–36.0)
MCV: 74.2 fL — AB (ref 78.0–100.0)
Platelets: 175 10*3/uL (ref 150–400)
RBC: 4.15 MIL/uL (ref 3.87–5.11)
RDW: 14.6 % (ref 11.5–15.5)
WBC: 7.2 10*3/uL (ref 4.0–10.5)

## 2018-01-05 LAB — WET PREP, GENITAL
CLUE CELLS WET PREP: NONE SEEN
SPERM: NONE SEEN
TRICH WET PREP: NONE SEEN
Yeast Wet Prep HPF POC: NONE SEEN

## 2018-01-05 LAB — URINALYSIS, ROUTINE W REFLEX MICROSCOPIC
Bilirubin Urine: NEGATIVE
GLUCOSE, UA: NEGATIVE mg/dL
Ketones, ur: NEGATIVE mg/dL
NITRITE: NEGATIVE
PH: 8 (ref 5.0–8.0)
PROTEIN: 30 mg/dL — AB
SPECIFIC GRAVITY, URINE: 1.015 (ref 1.005–1.030)

## 2018-01-05 MED ORDER — BETAMETHASONE SOD PHOS & ACET 6 (3-3) MG/ML IJ SUSP: 12.0000 mg | Freq: Once | INTRAMUSCULAR | Status: DC

## 2018-01-05 MED ORDER — BETAMETHASONE SOD PHOS & ACET 6 (3-3) MG/ML IJ SUSP
12.0000 mg | INTRAMUSCULAR | Status: DC
  Administered 2018-01-05: 12 mg via INTRAMUSCULAR
  Filled 2018-01-05: qty 2

## 2018-01-05 NOTE — Progress Notes (Signed)
BMZ 1 of 2 administered at 1800 on 01/05/18 in [facility]  Clayton BiblesSamantha Weinhold, [place] 01/05/18 6:28 PM

## 2018-01-05 NOTE — MAU Provider Note (Signed)
History     CSN: [id number]  Arrival date and time: 01/05/18 1328   First Provider Initiated Contact with Patient 01/05/18 1404      Chief Complaint  Patient presents with  . Vaginal Bleeding  . Abdominal Pain   HPI Aerilynn Goin is a 37 y.o. [id number] at [redacted]w[redacted]d who presents to MAU with chief complaint of vaginal bleeding. This is a new problem, onset yesterday morning. Patient states she "woke up in a puddle of blood" and immediately sought care at [facility]. Endorses receiving IV fluids prior to discharge with plan to follow up with [facility] outpatient. States her cervix was examined at [place] and determined to be closed.  Patient states she has continued to bleed today "but much lighter than yesterday". Denies abdominal pain, urinary symptoms, intercourse, fever, falls, SOB. Patient endorses feeling dizzy upon initial assessment in MAU.  Pregnancy is complicated by the following: Multinodular goiter; Depression; Supervision of high-risk pregnancy; Nausea and vomiting of pregnancy, antepartum; Previous preterm delivery at 36 weeks, antepartum; History of placental abruption; Benign cyst of right breast; Gestational diabetes; and GBS bacteriuria on their problem list.   OB History    Gravida  3   Para  1   Term  0   Preterm  1   AB  1   Living  1     SAB  1   TAB  0   Ectopic  0   Multiple  0   Live Births  1           Past Medical History:  Diagnosis Date  . Depression   . Headache   . Heart murmur   . Multinodular goiter   . Thyroid nodule     No past surgical history on file.  Family History  Problem Relation Age of Onset  . Hypertension Other        Grandparents  . Diabetes Other        Grandparents    Social History   Tobacco Use  . Smoking status: Former Smoker    Packs/day: 0.30    Types: Cigarettes    Start date: 07/01/2017  . Smokeless tobacco: Never Used  Substance Use Topics  . Alcohol use: Yes    Comment: occas. when not  preg  . Drug use: No    Allergies: No Known Allergies  Medications Prior to Admission  Medication Sig Dispense Refill Last Dose  . ACCU-CHEK FASTCLIX LANCETS MISC Check blood sugars up to  4 times a day  024.410 100 each 12 Taking  . ACCU-CHEK FASTCLIX LANCETS MISC CHECK BLOOD SUGARS UP TO 4 TIMES A DAY 100 each 12 Taking  . aspirin EC 81 MG tablet Take 1 tablet (81 mg total) by mouth daily. Take after 12 weeks for prevention of preeclampsia later in pregnancy 300 tablet 2 Taking  . Doxylamine-Pyridoxine ER (BONJESTA) 20-20 MG TBCR Take 1 tablet by mouth at bedtime. May add 1 tab midday if needed 60 tablet 6 Taking  . famotidine (PEPCID) 20 MG tablet Take 1 tablet (20 mg total) by mouth 2 (two) times daily. 60 tablet 3 Taking  . glucose blood test strip Check sugar 4 times a day 250.00 100 each 12 Taking  . metoCLOPramide (REGLAN) 10 MG tablet Take 1 tablet (10 mg total) by mouth every 8 (eight) hours as needed for up to 3 days for nausea. 20 tablet 0 Taking  . nitrofurantoin, macrocrystal-monohydrate, (MACROBID) 100 MG capsule Take 1 capsule (100 mg  total) by mouth 2 (two) times daily. 14 capsule 0   . ondansetron (ZOFRAN) 4 MG tablet Take 1 tablet (4 mg total) by mouth daily as needed for nausea or vomiting. 20 tablet 1 Taking  . Prenatal Vit-Fe Fumarate-FA (PREPLUS) 27-1 MG TABS Take 1 tablet by mouth daily. 30 tablet 13 Taking    Review of Systems  Constitutional: Negative for fatigue.  Respiratory: Negative for shortness of breath.   Gastrointestinal: Negative for abdominal pain, nausea and vomiting.  Genitourinary: Positive for vaginal bleeding. Negative for difficulty urinating, dyspareunia and dysuria.  Neurological: Positive for dizziness. Negative for syncope, weakness and headaches.  All other systems reviewed and are negative.  Physical Exam   Temperature 98.1 F (36.7 C), resp. rate 16, weight 86.2 kg, last menstrual period 05/21/2017.  Physical Exam  Nursing note and  vitals reviewed. Constitutional: She is oriented to person, place, and time. She appears well-developed and well-nourished.  Cardiovascular: Normal rate, normal heart sounds and intact distal pulses.  Respiratory: Effort normal.  GI: She exhibits no distension. There is no tenderness. There is no rebound and no guarding.  Gravid  Genitourinary:  Genitourinary Comments: Small amount of dark red blood visible at introitus, evacuated with fox swab x 1. Cervix closed   Neurological: She is alert and oriented to person, place, and time. She has normal reflexes.  Skin: Skin is warm.  Psychiatric: She has a normal mood and affect. Her behavior is normal. Judgment and thought content normal.   No new bleeding between initial assessment and discharge, approximately four hours  MAU Course  Procedures  MDM --FFN not collected due to recent check at [facility] --No abdominal tenderness on palpation --No new bleeding during triage in MAU~ 4 hours --Reactive fetal tracing: baseline 135, moderate variability, positive accelerations, rare variable deceleration --Toco: irritability with rare contraction --Cervix closed --per Dr. Dewayne Shorter, no bleeding during or after ultrasound --Discussed close outpatient management vs inpatient observation with Dr. Judeth Cornfield, Dr. Erin Fulling and patient  Patient Vitals for the past 24 hrs:  BP Temp Pulse Resp Weight  01/05/18 1812 125/68 - 95 - -  01/05/18 1401 121/66 - 80 - -  01/05/18 1400 - 98.1 F (36.7 C) - 16 -  01/05/18 1356 - - - - 86.2 kg    Orders Placed This Encounter  Procedures  . Wet prep, genital  . Culture, OB Urine  . [place] MFM OB LIMITED  . [place] MFM OB Transvaginal  . Urinalysis, Routine w reflex microscopic  . CBC  . Discharge patient   Results for orders placed or performed during the hospital encounter of 01/05/18 (from the past 24 hour(s))  Wet prep, genital     Status: Abnormal   Collection Time: 01/05/18  2:11 PM  Result Value Ref  Range   Yeast Wet Prep HPF POC NONE SEEN NONE SEEN   Trich, Wet Prep NONE SEEN NONE SEEN   Clue Cells Wet Prep HPF POC NONE SEEN NONE SEEN   WBC, Wet Prep HPF POC FEW (A) NONE SEEN   Sperm NONE SEEN   Urinalysis, Routine w reflex microscopic     Status: Abnormal   Collection Time: 01/05/18  2:34 PM  Result Value Ref Range   Color, Urine YELLOW YELLOW   APPearance CLOUDY (A) CLEAR   Specific Gravity, Urine 1.015 1.005 - 1.030   pH 8.0 5.0 - 8.0   Glucose, UA NEGATIVE NEGATIVE mg/dL   Hgb urine dipstick LARGE (A) NEGATIVE   Bilirubin  Urine NEGATIVE NEGATIVE   Ketones, ur NEGATIVE NEGATIVE mg/dL   Protein, ur 30 (A) NEGATIVE mg/dL   Nitrite NEGATIVE NEGATIVE   Leukocytes, UA TRACE (A) NEGATIVE   RBC / HPF 0-5 0 - 5 RBC/hpf   WBC, UA 11-20 0 - 5 WBC/hpf   Bacteria, UA RARE (A) NONE SEEN   Squamous Epithelial / LPF 21-50 0 - 5   Mucus PRESENT   CBC     Status: Abnormal   Collection Time: 01/05/18  2:40 PM  Result Value Ref Range   WBC 7.2 4.0 - 10.5 K/uL   RBC 4.15 3.87 - 5.11 MIL/uL   Hemoglobin 10.2 (L) 12.0 - 15.0 g/dL   HCT 16.1 (L) 09.6 - 04.5 %   MCV 74.2 (L) 78.0 - 100.0 fL   MCH 24.6 (L) 26.0 - 34.0 pg   MCHC 33.1 30.0 - 36.0 g/dL   RDW 40.9 81.1 - 91.4 %   Platelets 175 150 - 400 K/uL    [place] Mfm Ob Transvaginal  Result Date: 01/05/2018 ----------------------------------------------------------------------  OBSTETRICS REPORT                       (Signed Final 01/05/2018 05:54 pm) ---------------------------------------------------------------------- Patient Info  ID #:       [id number]                          D.O.B.:  1980/07/14 (36 yrs)  Name:       [facility]                Visit Date: 01/05/2018 05:48 pm ---------------------------------------------------------------------- Performed By  Performed By:     Eden Lathe BS      Ref. Address:     [address]                                                             [address]                                                              [place], [place]                                                             [address]  Attending:        Noralee Space MD        Location:         [facility]  Referred By:      Tereso Newcomer MD ---------------------------------------------------------------------- Orders   #  Description  Code         Ordered By   1  [place] MFM OB LIMITED                    76815.01     Clayton Bibles   2  [place] MFM OB TRANSVAGINAL               78295.6      Clayton Bibles  ----------------------------------------------------------------------   #  Order #                    Accession #                 Episode #   1  [id number]                  [id number]                  [id number]   2  [id number]                  [id number]                  [id number]  ---------------------------------------------------------------------- Indications   Vaginal bleeding in pregnancy, third trimester O46.93   Poor obstetric history: Previous preterm       O09.219   delivery, antepartum (@ 36wks, placental   abruption; started 17P injections 09/14/17)   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     O99.280, E07.9   goiter)   Medical complication of pregnancy (Heart       O26.90   murmur)   Placenta previa specified as without           O44.03   hemorrhage, third trimester - resolved   [redacted] weeks gestation of pregnancy                Z3A.32  ---------------------------------------------------------------------- Vital Signs                                                 Height:        5'3" ---------------------------------------------------------------------- Fetal Evaluation  Num Of Fetuses:         1  Fetal Heart Rate(bpm):  128  Cardiac Activity:       Observed  Presentation:           Cephalic  Placenta:               Posterior  P. Cord Insertion:       Previously Visualized  Amniotic Fluid  AFI FV:      Within normal limits  AFI Sum(cm)     %Tile       Largest Pocket(cm)  15.2            54          6.18  RUQ(cm)       RLQ(cm)       LUQ(cm)        LLQ(cm)  4.2           2.36          2.46           6.18  Comment:    No placental abruption or previa identified. ---------------------------------------------------------------------- OB History  Gravidity:    3         Prem:   1         SAB:   1  Living:       1 ---------------------------------------------------------------------- Gestational Age  LMP:           32w 5d        Date:  05/21/17                 EDD:   02/25/18  Best:          [name] 5d     Det. By:  LMP  (05/21/17)          EDD:   02/25/18 ---------------------------------------------------------------------- Cervix Uterus Adnexa  Cervix  Length:            2.8  cm.  Measured transvaginally.  Uterus  No abnormality visualized.  Cul De Sac  No free fluid seen.  Adnexa  No abnormality visualized. ---------------------------------------------------------------------- Impression  Patient is being evaluated in the MAU for c/o vaginal  bleeding. She reports that she had passed several blood  clots yesterday and was admitted to [facility] (she  was visiting her relatives in [place]). No vaginal  examination was performed at the hospital. She was  discharged later.  She reports persistent slight vaginal bleeding today. No  abdominal pain or uterine contractions.  She had sexual intercourse 2 weeks ago.  A limited ultrasound was performed. Amniotic fluid is normal  and good fetal activity is seen. Placenta is posterior and the  entire placenta could not be evaluated because of overlying  fetus. No obvious placental abruption is seen. The cervix  measures 2.8 cm, which is normal. There is no placenta  previa.  Vaginal probe cover was not blood stained.  I reassured the patient of the findings and also informed her  that placental abruption cannot be ruled out  on ultrasound.  Patient was transferred back to the MAU for further  management. ---------------------------------------------------------------------- Recommendations  Follow-up scans as clinically indicated. ----------------------------------------------------------------------                  Noralee Space, MD Electronically Signed Final Report   01/05/2018 05:54 pm ----------------------------------------------------------------------  [place] Mfm Ob Limited  Result Date: 01/05/2018 ----------------------------------------------------------------------  OBSTETRICS REPORT                       (Signed Final 01/05/2018 05:54 pm) ---------------------------------------------------------------------- Patient Info  ID #:       [id number]                          D.O.B.:  Apr 10, 1981 (36 yrs)  Name:       [facility]                Visit Date: 01/05/2018 05:48 pm ---------------------------------------------------------------------- Performed By  Performed By:  Carrie Stalter BS      Ref. Address:     [address]                    [address]                                                             [address]                                                             [place], [place]                                                             [address]  Attending:        Noralee Space MD        Location:         [facility]  Referred By:      Tereso Newcomer MD ---------------------------------------------------------------------- Orders   #  Description                          Code         Ordered By   1  [place] MFM OB LIMITED                    60454.09     Clayton Bibles   2  [place] MFM OB TRANSVAGINAL               81191.4      Clayton Bibles  ----------------------------------------------------------------------   #  Order #                    Accession #                 Episode #   1   [id number]                  [id number]                  [id number]   2  [id number]  [id number]                  [id number]  ---------------------------------------------------------------------- Indications   Vaginal bleeding in pregnancy, third trimester O46.93   Poor obstetric history: Previous preterm       O09.219   delivery, antepartum (@ 36wks, placental   abruption; started 17P injections 09/14/17)   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     O99.280, E07.9   goiter)   Medical complication of pregnancy (Heart       O26.90   murmur)   Placenta previa specified as without           O44.03   hemorrhage, third trimester - resolved   [redacted] weeks gestation of pregnancy                Z3A.32  ---------------------------------------------------------------------- Vital Signs                                                 Height:        5'3" ---------------------------------------------------------------------- Fetal Evaluation  Num Of Fetuses:         1  Fetal Heart Rate(bpm):  128  Cardiac Activity:       Observed  Presentation:           Cephalic  Placenta:               Posterior  P. Cord Insertion:      Previously Visualized  Amniotic Fluid  AFI FV:      Within normal limits  AFI Sum(cm)     %Tile       Largest Pocket(cm)  15.2            54          6.18  RUQ(cm)       RLQ(cm)       LUQ(cm)        LLQ(cm)  4.2           2.36          2.46           6.18  Comment:    No placental abruption or previa identified. ---------------------------------------------------------------------- OB History  Gravidity:    3         Prem:   1         SAB:   1  Living:       1 ---------------------------------------------------------------------- Gestational Age  LMP:           32w 5d        Date:  05/21/17                 EDD:   02/25/18  Best:          [name] 5d     Det. By:  LMP  (05/21/17)          EDD:   02/25/18 ---------------------------------------------------------------------- Cervix Uterus Adnexa  Cervix   Length:            2.8  cm.  Measured transvaginally.  Uterus  No abnormality visualized.  Cul De Sac  No free fluid seen.  Adnexa  No abnormality visualized. ---------------------------------------------------------------------- Impression  Patient is being evaluated in the MAU for c/o vaginal  bleeding. She reports that she had passed several blood  clots yesterday and was admitted to [facility]  hospital (she  was visiting her [place] in [place] No vaginal  examination was performed at the hospital. She was  discharged later.  She reports persistent slight vaginal bleeding today. No  abdominal pain or uterine contractions.  She had sexual intercourse 2 weeks ago.  A limited ultrasound was performed. Amniotic fluid is normal  and good fetal activity is seen. Placenta is posterior and the  entire placenta could not be evaluated because of overlying  fetus. No obvious placental abruption is seen. The cervix  measures 2.8 cm, which is normal. There is no placenta  previa.  Vaginal probe cover was not blood stained.  I reassured the patient of the findings and also informed her  that placental abruption cannot be ruled out on ultrasound.  Patient was transferred back to the MAU for further  management. ---------------------------------------------------------------------- Recommendations  Follow-up scans as clinically indicated. ----------------------------------------------------------------------                  Noralee Spaceavi Shankar, MD Electronically Signed Final Report   01/05/2018 05:54 pm ----------------------------------------------------------------------    Assessment and Plan  --37 y.o. [id number] at 4759w5d  --Blood Type A POS, Rhogam not indicated --Placenta previa resolved per Dr. Judeth CornfieldShankar 12/23/17, reiterated today --Reactive fetal tracing --Cervix closed --Emphasized pelvic rest precautions --BMZ #1 of 2 given in MAU at 1754 --Discharge home in stable condition  F/U: [facility] to [facility]  tomorrow morning, message sent to clinic admin         Pt to return to MAU at 6pm tomorrow for Betamethasone 2 of 2   Calvert CantorSamantha C Shateka Petrea, CNM 01/05/2018, 6:27 PM

## 2018-01-05 NOTE — Telephone Encounter (Signed)
Patient called and states that she was discharged from [facility] this morning, states that she was bleeding heavily, that is the reason that she went to the nearest hospital. States that they discharged her and told her to follow up with OB/GYN. Patient called extremely tired, still bleeding and concerned that "something isn't right". I told patient to go immediately to [facility] to be check at MAU.

## 2018-01-05 NOTE — Discharge Instructions (Signed)
Vaginal Bleeding During Pregnancy, Third Trimester °A small amount of bleeding (spotting) from the vagina is common in pregnancy. Sometimes the bleeding is normal and is not a problem, and sometimes it is a sign of something serious. Be sure to tell your doctor about any bleeding from your vagina right away. °Follow these instructions at home: °· Watch your condition for any changes. °· Follow your doctor's instructions about how active you can be. °· If you are on bed rest: °? You may need to stay in bed and only get up to use the bathroom. °? You may be allowed to do some activities. °? If you need help, make plans for someone to help you. °· Write down: °? The number of pads you use each day. °? How often you change pads. °? How soaked (saturated) your pads are. °· Do not use tampons. °· Do not douche. °· Do not have sex or orgasms until your doctor says it is okay. °· Follow your doctor's advice about lifting, driving, and doing physical activities. °· If you pass any tissue from your vagina, save the tissue so you can show it to your doctor. °· Only take medicines as told by your doctor. °· Do not take aspirin because it can make you bleed. °· Keep all follow-up visits as told by your doctor. °Contact a doctor if: °· You bleed from your vagina. °· You have cramps. °· You have labor pains. °· You have a fever that does not go away after you take medicine. °Get help right away if: °· You have very bad cramps in your back or belly (abdomen). °· You have chills. °· You have a gush of fluid from your vagina. °· You pass large clots or tissue from your vagina. °· You bleed more. °· You feel light-headed or weak. °· You pass out (faint). °· You do not feel your baby move around as much as before. °This information is not intended to replace advice given to you by your health care provider. Make sure you discuss any questions you have with your health care provider. °Document Released: 08/20/2013 Document Revised:  09/11/2015 Document Reviewed: 12/11/2012 °Elsevier Interactive Patient Education © 2018 [employer] ° °

## 2018-01-05 NOTE — MAU Note (Signed)
Pt is having abdominal pain and bleeding. States she woke up in blood this morning. Was seen at [facility] yesterday and sent home.

## 2018-01-06 ENCOUNTER — Inpatient Hospital Stay (HOSPITAL_COMMUNITY)
Admission: AD | Admit: 2018-01-06 | Discharge: 2018-01-06 | Disposition: A | Discharge status: Home / Self Care | Payer: Medicaid Other | Source: Ambulatory Visit | Attending: Obstetrics and Gynecology | Admitting: Obstetrics and Gynecology

## 2018-01-06 ENCOUNTER — Telehealth: Payer: Self-pay | Admitting: Radiology

## 2018-01-06 ENCOUNTER — Telehealth: Payer: Self-pay

## 2018-01-06 ENCOUNTER — Ambulatory Visit (INDEPENDENT_AMBULATORY_CARE_PROVIDER_SITE_OTHER): Payer: Medicaid Other

## 2018-01-06 ENCOUNTER — Other Ambulatory Visit: Payer: Self-pay

## 2018-01-06 ENCOUNTER — Encounter: Payer: Medicaid Other | Admitting: Obstetrics and Gynecology

## 2018-01-06 ENCOUNTER — Encounter (HOSPITAL_COMMUNITY): Payer: Self-pay | Admitting: *Deleted

## 2018-01-06 VITALS — BP 119/73 | HR 90 | Ht 63.0 in | Wt 190.8 lb

## 2018-01-06 DIAGNOSIS — O0991 Supervision of high risk pregnancy, unspecified, first trimester: Secondary | ICD-10-CM

## 2018-01-06 DIAGNOSIS — Z3A32 32 weeks gestation of pregnancy: Secondary | ICD-10-CM | POA: Diagnosis not present

## 2018-01-06 DIAGNOSIS — O09213 Supervision of pregnancy with history of pre-term labor, third trimester: Secondary | ICD-10-CM | POA: Diagnosis not present

## 2018-01-06 DIAGNOSIS — Z87891 Personal history of nicotine dependence: Secondary | ICD-10-CM | POA: Diagnosis not present

## 2018-01-06 DIAGNOSIS — O4693 Antepartum hemorrhage, unspecified, third trimester: Secondary | ICD-10-CM | POA: Diagnosis not present

## 2018-01-06 DIAGNOSIS — O09219 Supervision of pregnancy with history of pre-term labor, unspecified trimester: Secondary | ICD-10-CM

## 2018-01-06 LAB — GC/CHLAMYDIA PROBE AMP (~~LOC~~) NOT AT ARMC
Chlamydia: NEGATIVE
NEISSERIA GONORRHEA: NEGATIVE

## 2018-01-06 MED ORDER — HYDROXYPROGESTERONE CAPROATE 275 MG/1.1ML ~~LOC~~ SOAJ
275.0000 mg | Freq: Once | SUBCUTANEOUS | Status: AC
  Administered 2018-01-06: 275 mg via SUBCUTANEOUS

## 2018-01-06 MED ORDER — BETAMETHASONE SOD PHOS & ACET 6 (3-3) MG/ML IJ SUSP
12.0000 mg | Freq: Once | INTRAMUSCULAR | Status: AC
  Administered 2018-01-06: 12 mg via INTRAMUSCULAR
  Filled 2018-01-06: qty 2

## 2018-01-06 NOTE — Telephone Encounter (Signed)
Left voicemail for patient to call [id number] to schedule f/u appointment with Dr Vergie LivingPickens for MAU visit

## 2018-01-06 NOTE — Telephone Encounter (Signed)
Received called from patient stating she was seen in the MAU for bleeding on 01/05/2018. She was given 1 betamethasone yesterday and observed for several hours. Provider decided to let her go home since bleeding had resolved at that time and to follow up with office this am. Spoke with Juliette AlcideMelinda this am she reported no bleeding at this time but noticed a little dark blood after wiping. Per Provider (Dr.[name])  patient should report back to the hospital since she has noticed a small amount of blood. Patient is due for # 2 betamethasone at 5:30. I have also advised patient her 17-p injection is due. Patient voice understanding and recommendations at this time.

## 2018-01-06 NOTE — Progress Notes (Signed)
After obtaining consent, and per orders of Dr. Vergie LivingPickens, injection of Hydroxyprogesterone Caproate was administered by Riley NearingMorehead, Oiva Dibari Byrd. Last injection - 12/29/17 - left arm subcutaneous. Patient reports no complaints from previous injections. Administered injection - Right arm - subcutaneous. Patient tolerated injection well with no complaints.   Patient instructed to remain in clinic for 20 minutes afterwards, and to report any adverse reaction to me immediately. Patient advised to schedule next injection for next week.

## 2018-01-06 NOTE — Progress Notes (Signed)
No blood on digital exam done by Judeth HornErin Lawrence, NP.

## 2018-01-06 NOTE — MAU Note (Signed)
Pt states no bleeding like last night, nothing on the bad, just some brown when she wipes. No pain. Here for 2nd betamethasone

## 2018-01-06 NOTE — MAU Provider Note (Signed)
Chief Complaint:  betamethasone and Vaginal Bleeding   First Provider Initiated Contact with Patient 01/06/18 1821     HPI: [name] PollockMelinda Moreno is a 37 y.o. [id number] at [id number] who presents to maternity admissions reporting vaginal bleeding. Is here this evening for her 2nd betamethasone injection. Was seen yesterday for vaginal bleeding after waking up in a "puddle of blood" . She was monitored for several hours yesterday before being discharged. This morning she saw dark red spotting on toilet paper when she wiped. Has seen no blood since. Denies abdominal pain. Positive fetal movement.    Past Medical History:  Diagnosis Date  . Depression   . Headache   . Heart murmur   . Multinodular goiter   . Thyroid nodule    OB History  Gravida Para Term Preterm AB Living  3 1 0 1 1 1   SAB TAB Ectopic Multiple Live Births  1 0 0 0 1    # Outcome Date GA Lbr Len/2nd Weight Sex Delivery Anes PTL Lv  3 Current           2 SAB 04/19/17     SAB     1 Preterm 05/29/03 2115w0d    Vag-Spont   LIV     Birth Comments: Had "placental separation" leading to preterm delivery     Complications: Abruptio Placenta   History reviewed. No pertinent surgical history. Family History  Problem Relation Age of Onset  . Hypertension Other        Grandparents  . Diabetes Other        Grandparents   Social History   Tobacco Use  . Smoking status: Former Smoker    Packs/day: 0.30    Types: Cigarettes    Start date: 07/01/2017  . Smokeless tobacco: Never Used  Substance Use Topics  . Alcohol use: Yes    Comment: occas. when not preg  . Drug use: No   No Known Allergies Facility-Administered Medications Prior to Admission  Medication Dose Route Frequency Provider Last Rate Last Dose  . betamethasone acetate-betamethasone sodium phosphate (CELESTONE) injection 12 mg  12 mg Intramuscular Once Clayton BiblesWeinhold, Samantha C, CNM       Medications Prior to Admission  Medication Sig Dispense Refill Last Dose  . ACCU-CHEK  FASTCLIX LANCETS MISC Check blood sugars up to  4 times a day  024.410 100 each 12 Taking  . ACCU-CHEK FASTCLIX LANCETS MISC CHECK BLOOD SUGARS UP TO 4 TIMES A DAY 100 each 12 Taking  . aspirin EC 81 MG tablet Take 1 tablet (81 mg total) by mouth daily. Take after 12 weeks for prevention of preeclampsia later in pregnancy 300 tablet 2 Taking  . Doxylamine-Pyridoxine ER (BONJESTA) 20-20 MG TBCR Take 1 tablet by mouth at bedtime. May add 1 tab midday if needed 60 tablet 6 Taking  . famotidine (PEPCID) 20 MG tablet Take 1 tablet (20 mg total) by mouth 2 (two) times daily. 60 tablet 3 Taking  . fluticasone (FLONASE) 50 MCG/ACT nasal spray Place 1 spray into both nostrils daily as needed for allergies or rhinitis.   Taking  . glucose blood test strip Check sugar 4 times a day 250.00 100 each 12 Taking  . nitrofurantoin, macrocrystal-monohydrate, (MACROBID) 100 MG capsule Take 1 capsule (100 mg total) by mouth 2 (two) times daily. 14 capsule 0 Taking  . Prenatal Vit-Fe Fumarate-FA (PREPLUS) 27-1 MG TABS Take 1 tablet by mouth daily. 30 tablet 13 Taking    I have reviewed patient's Past Medical  Hx, Surgical Hx, Family Hx, Social Hx, medications and allergies.   ROS:  Review of Systems  Constitutional: Negative.   Gastrointestinal: Negative.   Genitourinary: Negative for vaginal bleeding.    Physical Exam   Patient Vitals for the past 24 hrs:  BP Temp Pulse Resp Weight  01/06/18 1826 128/69 - (!) 103 - -  01/06/18 1745 120/73 97.9 F (36.6 C) 94 17 87.8 kg    Constitutional: Well-developed, well-nourished female in no acute distress.  Cardiovascular: normal rate & rhythm, no murmur Respiratory: normal effort, lung sounds clear throughout GI: Abd soft, non-tender, gravid appropriate for gestational age. Pos BS x 4 MS: Extremities nontender, no edema, normal ROM Neurologic: Alert and oriented x 4.  GU:      Cervix closed. No blood on exam.    Exam by:: Judeth Horn, np  NST:  Baseline:  135 bpm, Variability: Good {> 6 bpm), Accelerations: Reactive and Decelerations: Absent   Labs: No results found for this or any previous visit (from the past 24 hour(s)).  Imaging:  No results found.  MAU Course: Orders Placed This Encounter  Procedures  . Discharge patient   Meds ordered this encounter  Medications  . betamethasone acetate-betamethasone sodium phosphate (CELESTONE) injection 12 mg    MDM: Reactive NST, No contractions. Abdomen soft & non tender. No bleeding on exam.  Pt given 2nd dose of BMZ Assessment: 1. Vaginal bleeding in pregnancy, third trimester   2. [redacted] weeks gestation of pregnancy     Plan: Discharge home in stable condition.  Preterm Labor & vaginal bleeding precautions, and fetal kick counts   Allergies as of 01/06/2018   No Known Allergies     Medication List    TAKE these medications   ACCU-CHEK FASTCLIX LANCETS Misc Check blood sugars up to  4 times a day  024.410   ACCU-CHEK FASTCLIX LANCETS Misc CHECK BLOOD SUGARS UP TO 4 TIMES A DAY   aspirin EC 81 MG tablet Take 1 tablet (81 mg total) by mouth daily. Take after 12 weeks for prevention of preeclampsia later in pregnancy   Doxylamine-Pyridoxine ER 20-20 MG Tbcr Take 1 tablet by mouth at bedtime. May add 1 tab midday if needed   famotidine 20 MG tablet Commonly known as:  PEPCID Take 1 tablet (20 mg total) by mouth 2 (two) times daily.   fluticasone 50 MCG/ACT nasal spray Commonly known as:  FLONASE Place 1 spray into both nostrils daily as needed for allergies or rhinitis.   glucose blood test strip Check sugar 4 times a day 250.00   nitrofurantoin (macrocrystal-monohydrate) 100 MG capsule Commonly known as:  MACROBID Take 1 capsule (100 mg total) by mouth 2 (two) times daily.   PREPLUS 27-1 MG Tabs Take 1 tablet by mouth daily.       Judeth Horn, NP 01/06/2018 6:50 PM

## 2018-01-06 NOTE — Discharge Instructions (Signed)
Vaginal Bleeding During Pregnancy, Third Trimester °A small amount of bleeding (spotting) from the vagina is relatively common in pregnancy. Various things can cause bleeding or spotting in pregnancy. Sometimes the bleeding is normal and is not a problem. However, bleeding during the third trimester can also be a sign of something serious for the mother and the baby. Be sure to tell your health care provider about any vaginal bleeding right away. °Some possible causes of vaginal bleeding during the third trimester include: °· The placenta may be partially or completely covering the opening to the cervix (placenta previa). °· The placenta may have separated from the uterus (abruption of the placenta). °· There may be an infection or growth on the cervix. °· You may be starting labor, called discharging of the mucus plug. °· The placenta may grow into the muscle layer of the uterus (placenta accreta). ° °Follow these instructions at home: °Watch your condition for any changes. The following actions may help to lessen any discomfort you are feeling: °· Follow your health care provider's instructions for limiting your activity. If your health care provider orders bed rest, you may need to stay in bed and only get up to use the bathroom. However, your health care provider may allow you to continue light activity. °· If needed, make plans for someone to help with your regular activities and responsibilities while you are on bed rest. °· Keep track of the number of pads you use each day, how often you change pads, and how soaked (saturated) they are. Write this down. °· Do not use tampons. Do not douche. °· Do not have sexual intercourse or orgasms until approved by your health care provider. °· Follow your health care provider's advice about lifting, driving, and physical activities. °· If you pass any tissue from your vagina, save the tissue so you can show it to your health care provider. °· Only take over-the-counter  or prescription medicines as directed by your health care provider. °· Do not take aspirin because it can make you bleed. °· Keep all follow-up appointments as directed by your health care provider. ° °Contact a health care provider if: °· You have any vaginal bleeding during any part of your pregnancy. °· You have cramps or labor pains. °· You have a fever, not controlled by medicine. °Get help right away if: °· You have severe cramps or pain in your back or belly (abdomen). °· You have chills. °· You have a gush of fluid from the vagina. °· You pass large clots or tissue from your vagina. °· Your bleeding increases. °· You feel light-headed or weak. °· You pass out. °· You feel less movement or no movement of the baby. °This information is not intended to replace advice given to you by your health care provider. Make sure you discuss any questions you have with your health care provider. °Document Released: 06/26/2002 Document Revised: 09/11/2015 Document Reviewed: 12/11/2012 °Elsevier Interactive Patient Education © 2018 [employer] ° °

## 2018-01-07 LAB — CULTURE, OB URINE: Culture: <10000 — AB

## 2018-01-08 ENCOUNTER — Encounter (HOSPITAL_COMMUNITY): Payer: Self-pay | Admitting: *Deleted

## 2018-01-08 ENCOUNTER — Inpatient Hospital Stay (HOSPITAL_COMMUNITY)
Admission: AD | Admit: 2018-01-08 | Discharge: 2018-01-13 | DRG: 831 | Disposition: A | Discharge status: Home / Self Care | Payer: Medicaid Other | Attending: Family Medicine | Admitting: Family Medicine

## 2018-01-08 ENCOUNTER — Inpatient Hospital Stay (HOSPITAL_BASED_OUTPATIENT_CLINIC_OR_DEPARTMENT_OTHER): Payer: Medicaid Other

## 2018-01-08 ENCOUNTER — Other Ambulatory Visit: Payer: Self-pay

## 2018-01-08 DIAGNOSIS — O99012 Anemia complicating pregnancy, second trimester: Secondary | ICD-10-CM | POA: Diagnosis present

## 2018-01-08 DIAGNOSIS — Z3A33 33 weeks gestation of pregnancy: Secondary | ICD-10-CM

## 2018-01-08 DIAGNOSIS — O4693 Antepartum hemorrhage, unspecified, third trimester: Secondary | ICD-10-CM

## 2018-01-08 DIAGNOSIS — O99013 Anemia complicating pregnancy, third trimester: Secondary | ICD-10-CM | POA: Diagnosis present

## 2018-01-08 DIAGNOSIS — O4593 Premature separation of placenta, unspecified, third trimester: Secondary | ICD-10-CM | POA: Diagnosis not present

## 2018-01-08 DIAGNOSIS — O24410 Gestational diabetes mellitus in pregnancy, diet controlled: Secondary | ICD-10-CM | POA: Diagnosis present

## 2018-01-08 DIAGNOSIS — D649 Anemia, unspecified: Secondary | ICD-10-CM | POA: Diagnosis present

## 2018-01-08 DIAGNOSIS — Z87891 Personal history of nicotine dependence: Secondary | ICD-10-CM | POA: Diagnosis not present

## 2018-01-08 DIAGNOSIS — R609 Edema, unspecified: Secondary | ICD-10-CM | POA: Diagnosis not present

## 2018-01-08 DIAGNOSIS — Z8632 Personal history of gestational diabetes: Secondary | ICD-10-CM | POA: Diagnosis present

## 2018-01-08 DIAGNOSIS — Z8759 Personal history of other complications of pregnancy, childbirth and the puerperium: Secondary | ICD-10-CM

## 2018-01-08 DIAGNOSIS — O99019 Anemia complicating pregnancy, unspecified trimester: Secondary | ICD-10-CM | POA: Diagnosis present

## 2018-01-08 DIAGNOSIS — R8271 Bacteriuria: Secondary | ICD-10-CM | POA: Diagnosis present

## 2018-01-08 LAB — DIC (DISSEMINATED INTRAVASCULAR COAGULATION)PANEL
Fibrinogen: 381 mg/dL (ref 210–475)
INR: 0.96
Platelets: 187 10*3/uL (ref 150–400)
Smear Review: NONE SEEN
aPTT: 25 seconds (ref 24–36)

## 2018-01-08 LAB — RAPID URINE DRUG SCREEN, HOSP PERFORMED
Amphetamines: NOT DETECTED
Barbiturates: NOT DETECTED
Benzodiazepines: NOT DETECTED
Cocaine: NOT DETECTED
Opiates: NOT DETECTED
Tetrahydrocannabinol: NOT DETECTED

## 2018-01-08 LAB — URINALYSIS, ROUTINE W REFLEX MICROSCOPIC
Bilirubin Urine: NEGATIVE
GLUCOSE, UA: NEGATIVE mg/dL
Ketones, ur: NEGATIVE mg/dL
LEUKOCYTES UA: NEGATIVE
NITRITE: NEGATIVE
PROTEIN: NEGATIVE mg/dL
Specific Gravity, Urine: 1.011 (ref 1.005–1.030)
pH: 6 (ref 5.0–8.0)

## 2018-01-08 LAB — COMPREHENSIVE METABOLIC PANEL
ALBUMIN: 3.4 g/dL — AB (ref 3.5–5.0)
ALK PHOS: 82 U/L (ref 38–126)
ALT: 18 U/L (ref 0–44)
ANION GAP: 9 (ref 5–15)
AST: 21 U/L (ref 15–41)
BILIRUBIN TOTAL: 0.4 mg/dL (ref 0.3–1.2)
BUN: 11 mg/dL (ref 6–20)
CALCIUM: 8.8 mg/dL — AB (ref 8.9–10.3)
CO2: 20 mmol/L — ABNORMAL LOW (ref 22–32)
Chloride: 108 mmol/L (ref 98–111)
Creatinine, Ser: 0.67 mg/dL (ref 0.44–1.00)
GFR calc Af Amer: >60 mL/min (ref >60)
Glucose, Bld: 105 mg/dL — ABNORMAL HIGH (ref 70–99)
Potassium: 3.8 mmol/L (ref 3.5–5.1)
Sodium: 137 mmol/L (ref 135–145)
TOTAL PROTEIN: 6.8 g/dL (ref 6.5–8.1)

## 2018-01-08 LAB — CBC
HEMATOCRIT: 28.8 % — AB (ref 36.0–46.0)
Hemoglobin: 9.5 g/dL — ABNORMAL LOW (ref 12.0–15.0)
MCH: 24.5 pg — AB (ref 26.0–34.0)
MCHC: 33 g/dL (ref 30.0–36.0)
MCV: 74.2 fL — AB (ref 78.0–100.0)
Platelets: 196 10*3/uL (ref 150–400)
RBC: 3.88 MIL/uL (ref 3.87–5.11)
RDW: 14.8 % (ref 11.5–15.5)
WBC: 11.5 10*3/uL — AB (ref 4.0–10.5)

## 2018-01-08 LAB — GLUCOSE, CAPILLARY
GLUCOSE-CAPILLARY: 95 mg/dL (ref 70–99)
GLUCOSE-CAPILLARY: 80 mg/dL (ref 70–99)

## 2018-01-08 LAB — ABO/RH: ABO/RH(D): A POS

## 2018-01-08 LAB — DIC (DISSEMINATED INTRAVASCULAR COAGULATION) PANEL
D DIMER QUANT: 2.54 ug{FEU}/mL — AB (ref 0.00–0.50)
PROTHROMBIN TIME: 12.7 s (ref 11.4–15.2)

## 2018-01-08 LAB — PREPARE RBC (CROSSMATCH)

## 2018-01-08 MED ORDER — DOCUSATE SODIUM 100 MG PO CAPS
100.0000 mg | ORAL_CAPSULE | Freq: Every day | ORAL | Status: DC
  Administered 2018-01-08: 100 mg via ORAL
  Filled 2018-01-08 (×2): qty 1

## 2018-01-08 MED ORDER — FENTANYL CITRATE (PF) 100 MCG/2ML IJ SOLN: 50.0000 ug | INTRAMUSCULAR | Status: DC | PRN

## 2018-01-08 MED ORDER — OXYTOCIN 40 UNITS IN LACTATED RINGERS INFUSION - SIMPLE MED: 2.5000 [IU]/h | INTRAVENOUS | Status: DC

## 2018-01-08 MED ORDER — OXYCODONE-ACETAMINOPHEN 5-325 MG PO TABS: 2.0000 | ORAL_TABLET | ORAL | Status: DC | PRN

## 2018-01-08 MED ORDER — ACETAMINOPHEN 325 MG PO TABS: 650.0000 mg | ORAL_TABLET | ORAL | Status: DC | PRN

## 2018-01-08 MED ORDER — OXYCODONE-ACETAMINOPHEN 5-325 MG PO TABS: 1.0000 | ORAL_TABLET | ORAL | Status: DC | PRN

## 2018-01-08 MED ORDER — HYDROXYZINE HCL 50 MG PO TABS
50.0000 mg | ORAL_TABLET | Freq: Four times a day (QID) | ORAL | Status: DC | PRN
  Filled 2018-01-08: qty 1

## 2018-01-08 MED ORDER — OXYTOCIN BOLUS FROM INFUSION: 500.0000 mL | Freq: Once | INTRAVENOUS | Status: DC

## 2018-01-08 MED ORDER — SOD CITRATE-CITRIC ACID 500-334 MG/5ML PO SOLN: 30.0000 mL | ORAL | Status: DC | PRN

## 2018-01-08 MED ORDER — SODIUM CHLORIDE 0.9 % IV SOLN
5.0000 10*6.[IU] | Freq: Once | INTRAVENOUS | Status: AC
  Administered 2018-01-08: 5 10*6.[IU] via INTRAVENOUS
  Filled 2018-01-08: qty 5

## 2018-01-08 MED ORDER — LACTATED RINGERS IV SOLN: 500.0000 mL | INTRAVENOUS | Status: DC | PRN

## 2018-01-08 MED ORDER — ACETAMINOPHEN 325 MG PO TABS
650.0000 mg | ORAL_TABLET | ORAL | Status: DC | PRN
  Administered 2018-01-08 – 2018-01-12 (×4): 650 mg via ORAL
  Filled 2018-01-08 (×4): qty 2

## 2018-01-08 MED ORDER — LACTATED RINGERS IV SOLN
INTRAVENOUS | Status: DC
  Administered 2018-01-08: 04:00:00 via INTRAVENOUS

## 2018-01-08 MED ORDER — FAMOTIDINE 20 MG PO TABS
20.0000 mg | ORAL_TABLET | Freq: Two times a day (BID) | ORAL | Status: DC
  Administered 2018-01-08 – 2018-01-13 (×11): 20 mg via ORAL
  Filled 2018-01-08 (×11): qty 1

## 2018-01-08 MED ORDER — ZOLPIDEM TARTRATE 5 MG PO TABS
5.0000 mg | ORAL_TABLET | Freq: Every evening | ORAL | Status: DC | PRN
  Administered 2018-01-08: 5 mg via ORAL
  Filled 2018-01-08: qty 1

## 2018-01-08 MED ORDER — PENICILLIN G 3 MILLION UNITS IVPB - SIMPLE MED
3.0000 10*6.[IU] | INTRAVENOUS | Status: DC
  Administered 2018-01-08: 3 10*6.[IU] via INTRAVENOUS
  Filled 2018-01-08 (×2): qty 100

## 2018-01-08 MED ORDER — FLEET ENEMA 7-19 GM/118ML RE ENEM: 1.0000 | ENEMA | Freq: Every day | RECTAL | Status: DC | PRN

## 2018-01-08 MED ORDER — FLUTICASONE PROPIONATE 50 MCG/ACT NA SUSP
1.0000 | Freq: Every day | NASAL | Status: DC | PRN
  Administered 2018-01-09 – 2018-01-12 (×2): 1 via NASAL
  Filled 2018-01-08: qty 16

## 2018-01-08 MED ORDER — PRENATAL PLUS 27-1 MG PO TABS: 1.0000 | ORAL_TABLET | Freq: Every day | ORAL | Status: DC

## 2018-01-08 MED ORDER — LIDOCAINE HCL (PF) 1 % IJ SOLN
30.0000 mL | INTRAMUSCULAR | Status: DC | PRN
  Filled 2018-01-08: qty 30

## 2018-01-08 MED ORDER — ONDANSETRON HCL 4 MG/2ML IJ SOLN: 4.0000 mg | Freq: Four times a day (QID) | INTRAMUSCULAR | Status: DC | PRN

## 2018-01-08 MED ORDER — CALCIUM CARBONATE ANTACID 500 MG PO CHEW
2.0000 | CHEWABLE_TABLET | ORAL | Status: DC | PRN
  Administered 2018-01-12: 400 mg via ORAL
  Filled 2018-01-08: qty 2

## 2018-01-08 MED ORDER — ASPIRIN EC 81 MG PO TBEC
81.0000 mg | DELAYED_RELEASE_TABLET | Freq: Every day | ORAL | Status: DC
  Administered 2018-01-08: 81 mg via ORAL
  Filled 2018-01-08 (×2): qty 1

## 2018-01-08 MED ORDER — SODIUM CHLORIDE 0.9% IV SOLUTION: Freq: Once | INTRAVENOUS | Status: DC

## 2018-01-08 NOTE — Consult Note (Addendum)
Neonatology Consult to Antenatal Patient:  I was asked by Dr. Macon LargeAnyanwu to see this patient in order to provide antenatal counseling due to prematurity.  [name] was admitted today 01/08/18 at 33 1/[redacted] weeks GA due to vaginal bleeding.  She is currently not having active labor and bleeding has subsided. She is getting BMZ and IV PCN.  Reassuring fetal status.  Pregnancy complicated by GBS bacteruria, GDM diet controlled, depression and goiter.    I spoke with the patient and FOB. We discussed the worst case of delivery in the next 1-2 days, including usual DR management, possible respiratory complications and need for support, IV access, feedings (mother desires breast feeding, which was encouraged), LOS, Mortality and Morbidity, and long term outcomes. I answered her questions at this time. I offered a NICU tour to any interested family members and would be glad to come back if they have more questions later.  I left them a [employer] of [employer] handout on prematurity and outcomes.    Thank you for asking me to see this patient.  Dineen Kidavid C. Leary RocaEhrmann, MD Neonatologist  The total length of face-to-face or floor/unit time for this encounter was 25 minutes. Counseling and/or coordination of care was 40 minutes of the above.

## 2018-01-08 NOTE — H&P (Signed)
FACULTY PRACTICE ANTEPARTUM ADMISSION HISTORY AND PHYSICAL NOTE   History of Present Illness: Syniah Berne is a 37 y.o. [id number] at [redacted]w[redacted]d admitted for vaginal bleeding in the third trimester/presumed placental abruption. Patient has been seen in MAU since 01/05/18 for bleeding. She reported having significant bleeding on 01/04/18 for which she was evaluated at [facility], given IV fluids and sent home after observation in the ED with plans for outpatient follow up.  During her MAU visit on 01/05/18, she had scant bleeding noted, and ultrasound showed a posterior placenta without obvious abruption. She was given betamethasone regimen on 9/19-9/20 in MAU; had no bleeding on exam during her secondary evaluation in MAU on 01/06/18.  Always had Category I FHR tracing during her visits. She was also seen at the [facility] office on 01/06/18 and received her weekly 17P injection.   Patient reported feeling okay until 0100 this morning when she woke up after feeling a "pop sensation" down there and noticing a puddle of blood and clots. She had lower abdominal cramping, no overt contractions. No LOF. Good FM. She returned to MAU for evaluation.  On evaluation today, she reports abdominal cramping and bleeding as above.  No signs/symptoms of anemia. No other systemic symptoms.   Patient Active Problem List   Diagnosis Date Noted  . Preterm labor in third trimester 01/08/2018  . GBS bacteriuria 01/02/2018  . Gestational diabetes 12/03/2017  . Benign cyst of right breast 10/13/2017  . Supervision of high-risk pregnancy 08/15/2017  . Third trimester bleeding, antepartum 08/15/2017  . Previous preterm delivery at 36 weeks, antepartum 08/15/2017  . History of placental abruption 08/15/2017  . Depression   . Multinodular goiter 01/04/2012    Past Medical History:  Diagnosis Date  . Depression   . Headache   . Heart murmur   . Multinodular goiter   . Thyroid nodule     History reviewed. No pertinent  surgical history.  OB History  Gravida Para Term Preterm AB Living  3 1 0 1 1 1   SAB TAB Ectopic Multiple Live Births  1 0 0 0 1    # Outcome Date GA Lbr Len/2nd Weight Sex Delivery Anes PTL Lv  3 Current           2 SAB 04/19/17     SAB     1 Preterm 05/29/03 [redacted]w[redacted]d    Vag-Spont   LIV     Birth Comments: Had "placental separation" leading to preterm delivery     Complications: Abruptio Placenta    Social History   Socioeconomic History  . Marital status: Single    Spouse name: Not on file  . Number of children: Not on file  . Years of education: 5  . Highest education level: Not on file  Occupational History  . Occupation: CNA  Social Needs  . Financial resource strain: Not on file  . Food insecurity:    Worry: Not on file    Inability: Not on file  . Transportation needs:    Medical: Not on file    Non-medical: Not on file  Tobacco Use  . Smoking status: Former Smoker    Packs/day: 0.30    Types: Cigarettes    Start date: 07/01/2017  . Smokeless tobacco: Never Used  Substance and Sexual Activity  . Alcohol use: Yes    Comment: occas. when not preg  . Drug use: No  . Sexual activity: Yes    Partners: Male    Birth control/protection: None  Lifestyle  . Physical activity:    Days per week: Not on file    Minutes per session: Not on file  . Stress: Not on file  Relationships  . Social connections:    Talks on phone: Not on file    Gets together: Not on file    Attends religious service: Not on file    Active member of club or organization: Not on file    Attends meetings of clubs or organizations: Not on file    Relationship status: Not on file  Other Topics Concern  . Not on file  Social History Narrative  . Not on file    Family History  Problem Relation Age of Onset  . Hypertension Other        Grandparents  . Diabetes Other        Grandparents    No Known Allergies  Medications Prior to Admission  Medication Sig Dispense Refill Last Dose   . ACCU-CHEK FASTCLIX LANCETS MISC Check blood sugars up to  4 times a day  024.410 100 each 12 Taking  . ACCU-CHEK FASTCLIX LANCETS MISC CHECK BLOOD SUGARS UP TO 4 TIMES A DAY 100 each 12 Taking  . aspirin EC 81 MG tablet Take 1 tablet (81 mg total) by mouth daily. Take after 12 weeks for prevention of preeclampsia later in pregnancy 300 tablet 2 Taking  . Doxylamine-Pyridoxine ER (BONJESTA) 20-20 MG TBCR Take 1 tablet by mouth at bedtime. May add 1 tab midday if needed 60 tablet 6 Taking  . famotidine (PEPCID) 20 MG tablet Take 1 tablet (20 mg total) by mouth 2 (two) times daily. 60 tablet 3 Taking  . fluticasone (FLONASE) 50 MCG/ACT nasal spray Place 1 spray into both nostrils daily as needed for allergies or rhinitis.   Taking  . glucose blood test strip Check sugar 4 times a day 250.00 100 each 12 Taking  . nitrofurantoin, macrocrystal-monohydrate, (MACROBID) 100 MG capsule Take 1 capsule (100 mg total) by mouth 2 (two) times daily. 14 capsule 0 Taking  . Prenatal Vit-Fe Fumarate-FA (PREPLUS) 27-1 MG TABS Take 1 tablet by mouth daily. 30 tablet 13 Taking    Review of Systems - Negative except what is mentioned in HPI  Vitals:  BP 124/62   Pulse 82   Temp 97.9 F (36.6 C) (Oral)   Resp 18   Ht 5\' 3"  (1.6 m)   Wt 87.1 kg   LMP 05/21/2017 (Exact Date)   BMI 34.01 kg/m  Physical Examination: CONSTITUTIONAL: Well-developed, well-nourished female in no acute distress.  HENT:  Normocephalic, atraumatic, External right and left ear normal. Oropharynx is clear and moist EYES: Conjunctivae and EOM are normal. Pupils are equal, round, and reactive to light. No scleral icterus.  NECK: Normal range of motion, supple, no masses SKIN: Skin is warm and dry. No rash noted. Not diaphoretic. No erythema. No pallor. NEUROLGIC: Alert and oriented to person, place, and time. Normal reflexes, muscle tone coordination. No cranial nerve deficit noted. PSYCHIATRIC: Normal mood and affect. Normal  behavior. Normal judgment and thought content. CARDIOVASCULAR: Normal heart rate noted, regular rhythm RESPIRATORY: Effort and breath sounds normal, no problems with respiration noted ABDOMEN: Soft, nontender, nondistended, gravid. MUSCULOSKELETAL: Normal range of motion. No edema and no tenderness. 2+ distal pulses.  Cervix: Evaluated by sterile speculum exam. and Evaluated by digital exam. and found to be 3 cm/ 60%/-2 and fetal presentation is cephalic which was verified on bedside ultrasound. Had small clots that were evacuated  from vagina, no active bleeding noted.  Membranes:intact Fetal Monitoring:Baseline: 130 bpm, Variability: moderate, Accelerations: Reactive and Decelerations: Absent Tocometer: irritability  Labs:  No results found for this or any previous visit (from the past 24 hour(s)).  Imaging Studies: 01/08/18 [place] MFM OB Limited Preliminary Read: No obvious abruption. Cephalic. Normal AFV.  [place] Mfm Ob Transvaginal  Result Date: 01/05/2018 ----------------------------------------------------------------------  OBSTETRICS REPORT                       (Signed Final 01/05/2018 05:54 pm) ---------------------------------------------------------------------- Patient Info  ID #:       [id number]                          D.O.B.:  June 23, 1980 (36 yrs)  Name:       [facility]                Visit Date: 01/05/2018 05:48 pm ---------------------------------------------------------------------- Performed By  Performed By:     Eden Lathe BS      Ref. Address:     [address]                    [facility]                                                             [address]                                                             [place], [place]                                                             [address]  Attending:        Noralee Space MD        Location:         [facility]  Referred By:      Tereso Newcomer MD  ---------------------------------------------------------------------- Orders   #  Description                          Code         Ordered By   1  [place] MFM OB LIMITED                    19147.82     [facility]   2  [place] MFM OB  TRANSVAGINAL               [address].6      [facility]  ----------------------------------------------------------------------   #  Order #                    Accession #                 Episode #   1  [id number]                  [id number]                  [id number]   2  [id number]                  [id number]                  [id number]  ---------------------------------------------------------------------- Indications   Vaginal bleeding in pregnancy, third trimester O46.93   Poor obstetric history: Previous preterm       O09.219   delivery, antepartum (@ 36wks, placental   abruption; started 17P injections 09/14/17)   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     O99.280, E07.9   goiter)   Medical complication of pregnancy (Heart       O26.90   murmur)   Placenta previa specified as without           O44.03   hemorrhage, third trimester - resolved   [redacted] weeks gestation of pregnancy                Z3A.32  ---------------------------------------------------------------------- Vital Signs                                                 Height:        5'3" ---------------------------------------------------------------------- Fetal Evaluation  Num Of Fetuses:         1  Fetal Heart Rate(bpm):  128  Cardiac Activity:       Observed  Presentation:           Cephalic  Placenta:               Posterior  P. Cord Insertion:      Previously Visualized  Amniotic Fluid  AFI FV:      Within normal limits  AFI Sum(cm)     %Tile       Largest Pocket(cm)  15.2            54          6.18  RUQ(cm)       RLQ(cm)       LUQ(cm)        LLQ(cm)  4.2           2.36          2.46           6.18  Comment:     No placental abruption or previa identified. ---------------------------------------------------------------------- OB History  Gravidity:    3  Prem:   1         SAB:   1  Living:       1 ---------------------------------------------------------------------- Gestational Age  LMP:           32w 5d        Date:  05/21/17                 EDD:   02/25/18  Best:          [name] 5d     Det. By:  LMP  (05/21/17)          EDD:   02/25/18 ---------------------------------------------------------------------- Cervix Uterus Adnexa  Cervix  Length:            2.8  cm.  Measured transvaginally.  Uterus  No abnormality visualized.  Cul De Sac  No free fluid seen.  Adnexa  No abnormality visualized. ---------------------------------------------------------------------- Impression  Patient is being evaluated in the MAU for c/o vaginal  bleeding. She reports that she had passed several blood  clots yesterday and was admitted to [facility] (she  was visiting her relatives in [place]). No vaginal  examination was performed at the hospital. She was  discharged later.  She reports persistent slight vaginal bleeding today. No  abdominal pain or uterine contractions.  She had sexual intercourse 2 weeks ago.  A limited ultrasound was performed. Amniotic fluid is normal  and good fetal activity is seen. Placenta is posterior and the  entire placenta could not be evaluated because of overlying  fetus. No obvious placental abruption is seen. The cervix  measures 2.8 cm, which is normal. There is no placenta  previa.  Vaginal probe cover was not blood stained.  I reassured the patient of the findings and also informed her  that placental abruption cannot be ruled out on ultrasound.  Patient was transferred back to the MAU for further  management. ---------------------------------------------------------------------- Recommendations  Follow-up scans as clinically indicated.  ----------------------------------------------------------------------                  Noralee Space, MD Electronically Signed Final Report   01/05/2018 05:54 pm ----------------------------------------------------------------------  [place] Mfm Ob Transvaginal  Result Date: 12/23/2017 ----------------------------------------------------------------------  OBSTETRICS REPORT                       (Signed Final 12/23/2017 03:27 pm) ---------------------------------------------------------------------- Patient Info  ID #:       [id number]                          D.O.B.:  Feb 20, 1981 (36 yrs)  Name:       [facility]                Visit Date: 12/23/2017 02:21 pm ---------------------------------------------------------------------- Performed By  Performed By:     Eden Lathe BS      Ref. Address:     [address]                    [facility]                                                             [address]  [place], [place]                                                             [address]  Attending:        Patsi Sears      Location:         [facility]                    MD  Referred By:      Tereso Newcomer MD ---------------------------------------------------------------------- Orders   #  Description                          Code         Ordered By   1  [place] MFM OB FOLLOW UP                  [phone]     [facility]   2  [place] MFM OB TRANSVAGINAL               47829.5      RAVI [facility]  ----------------------------------------------------------------------   #  Order #                    Accession #                 Episode #   1  [id number]                  [id number]                  [id number]   2  [id number]                  [id number]                  [id number]  ---------------------------------------------------------------------- Indications   Poor obstetric history: Previous preterm       O09.219   delivery, antepartum (@  36wks, placental   abruption; started 17P injections 09/14/17)   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     O99.280, E07.9   goiter)   Medical complication of pregnancy (Heart       O26.90   murmur)   Placenta previa specified as without           O44.03   hemorrhage, third trimester   [redacted] weeks gestation of pregnancy                Z3A.30  ---------------------------------------------------------------------- Vital Signs                                                 Height:        5'3" ---------------------------------------------------------------------- Fetal Evaluation  Num Of Fetuses:         1  Fetal Heart Rate(bpm):  136  Cardiac Activity:       Observed  Presentation:           Cephalic  Placenta:  Posterior  P. Cord Insertion:      Visualized  Amniotic Fluid  AFI FV:      Within normal limits  AFI Sum(cm)     %Tile       Largest Pocket(cm)  16.33           59          6.39  RUQ(cm)       RLQ(cm)       LUQ(cm)        LLQ(cm)  6.39          1.66          4.11           4.17 ---------------------------------------------------------------------- Biometry  BPD:      70.9  mm     G. Age:  28w 3d          1  %    CI:        68.89   %    70 - 86                                                          FL/HC:      22.2   %    19.3 - 21.3  HC:      272.9  mm     G. Age:  29w 6d          3  %    HC/AC:      1.04        0.96 - 1.17  AC:      262.3  mm     G. Age:  30w 2d         32  %    FL/BPD:     85.5   %    71 - 87  FL:       60.6  mm     G. Age:  31w 4d         54  %    FL/AC:      23.1   %    20 - 24  HUM:        52  mm     G. Age:  30w 2d         42  %  CER:      39.4  mm     G. Age:  33w 3d         90  %  Est. FW:    1588  gm      3 lb 8 oz     48  % ---------------------------------------------------------------------- OB History  Gravidity:    3         Prem:   1         SAB:   1  Living:       1 ---------------------------------------------------------------------- Gestational Age  LMP:            30w 6d        Date:  05/21/17                 EDD:   02/25/18  U/S Today:     30w 0d  EDD:   03/03/18  Best:          30w 6d     Det. By:  LMP  (05/21/17)          EDD:   02/25/18 ---------------------------------------------------------------------- Anatomy  Cranium:               Appears normal         Aortic Arch:            Appears normal  Cavum:                 Appears normal         Ductal Arch:            Previously seen  Ventricles:            Appears normal         Diaphragm:              Appears normal  Choroid Plexus:        Appears normal         Stomach:                Appears normal, left                                                                        sided  Cerebellum:            Appears normal         Abdomen:                Appears normal  Posterior Fossa:       Appears normal         Abdominal Wall:         Appears nml (cord                                                                        insert, abd wall)  Nuchal Fold:           Previously seen        Cord Vessels:           Appears normal (3                                                                        vessel cord)  Face:                  Appears normal         Kidneys:                Appear normal                         (  orbits and profile)  Lips:                  Appears normal         Bladder:                Appears normal  Thoracic:              Appears normal         Spine:                  Previously seen  Heart:                 Appears normal         Upper Extremities:      Previously seen                         (4CH, axis, and                         situs)  RVOT:                  Appears normal         Lower Extremities:      Previously seen  LVOT:                  Appears normal  Other:  Heels previously visualized. Open hands previously visualized. Nasal          bone visualized. Technically difficult due to fetal position.  ---------------------------------------------------------------------- Cervix Uterus Adnexa  Cervix  Length:            3.8  cm.  Normal appearance by transvaginal scan  Uterus  No abnormality visualized.  Left Ovary  Within normal limits.  Right Ovary  Not visualized.  Cul De Sac  No free fluid seen.  Adnexa  No abnormality visualized. ---------------------------------------------------------------------- Comments  U/S images reviewed. Findings reviewed with patient.  Appropriate fetal growth is noted.  No fetal abnormalities are  seen.  Placenta previa has resolved  Questions answered.  10 minutes spent face to face.  Recommendations: 1) Follow-up growth in 6 weeks 2)  Weekly BPP beginning @ 36 weeks ---------------------------------------------------------------------- Recommendations  1) Follow-up growth in 6 weeks 2) Weekly BPP beginning @  36 weeks ----------------------------------------------------------------------               Patsi Sears, MD Electronically Signed Final Report   12/23/2017 03:27 pm ----------------------------------------------------------------------  [place] Mfm Ob Follow Up  Result Date: 12/23/2017 ----------------------------------------------------------------------  OBSTETRICS REPORT                       (Signed Final 12/23/2017 03:27 pm) ---------------------------------------------------------------------- Patient Info  ID #:       [id number]                          D.O.B.:  April 25, 1980 (36 yrs)  Name:       [facility]                Visit Date: 12/23/2017 02:21 pm ---------------------------------------------------------------------- Performed By  Performed By:     Eden Lathe BS      Ref. Address:     [address]                    [facility]  [address]                                                             [place], [place]                                                             [address]  Attending:         Patsi Sears      Location:         [facility]                    MD  Referred By:      Tereso Newcomer MD ---------------------------------------------------------------------- Orders   #  Description                          Code         Ordered By   1  [place] MFM OB FOLLOW UP                  [phone]     RAVI [facility]   2  [place] MFM OB TRANSVAGINAL               81191.4      RAVI [facility]  ----------------------------------------------------------------------   #  Order #                    Accession #                 Episode #   1  [id number]                  [id number]                  [id number]   2  [id number]                  [id number]                  [id number]  ---------------------------------------------------------------------- Indications   Poor obstetric history: Previous preterm       O09.219   delivery, antepartum (@ 36wks, placental   abruption; started 17P injections 09/14/17)   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     O99.280, E07.9   goiter)   Medical complication of pregnancy (Heart       O26.90   murmur)   Placenta previa specified as without           O44.03   hemorrhage, third trimester   [redacted] weeks gestation of pregnancy                Z3A.30  ---------------------------------------------------------------------- Vital Signs  Height:        5'3" ---------------------------------------------------------------------- Fetal Evaluation  Num Of Fetuses:         1  Fetal Heart Rate(bpm):  136  Cardiac Activity:       Observed  Presentation:           Cephalic  Placenta:               Posterior  P. Cord Insertion:      Visualized  Amniotic Fluid  AFI FV:      Within normal limits  AFI Sum(cm)     %Tile       Largest Pocket(cm)  16.33           59          6.39  RUQ(cm)       RLQ(cm)       LUQ(cm)        LLQ(cm)  6.39          1.66          4.11           4.17  ---------------------------------------------------------------------- Biometry  BPD:      70.9  mm     G. Age:  28w 3d          1  %    CI:        68.89   %    70 - 86                                                          FL/HC:      22.2   %    19.3 - 21.3  HC:      272.9  mm     G. Age:  29w 6d          3  %    HC/AC:      1.04        0.96 - 1.17  AC:      262.3  mm     G. Age:  30w 2d         32  %    FL/BPD:     85.5   %    71 - 87  FL:       60.6  mm     G. Age:  31w 4d         54  %    FL/AC:      23.1   %    20 - 24  HUM:        52  mm     G. Age:  30w 2d         42  %  CER:      39.4  mm     G. Age:  33w 3d         90  %  Est. FW:    1588  gm      3 lb 8 oz     48  % ---------------------------------------------------------------------- OB History  Gravidity:    3         Prem:   1         SAB:   1  Living:       1 ---------------------------------------------------------------------- Gestational Age  LMP:           30w 6d        Date:  05/21/17                 EDD:   02/25/18  U/S Today:     30w 0d                                        EDD:   03/03/18  Best:          30w 6d     Det. By:  LMP  (05/21/17)          EDD:   02/25/18 ---------------------------------------------------------------------- Anatomy  Cranium:               Appears normal         Aortic Arch:            Appears normal  Cavum:                 Appears normal         Ductal Arch:            Previously seen  Ventricles:            Appears normal         Diaphragm:              Appears normal  Choroid Plexus:        Appears normal         Stomach:                Appears normal, left                                                                        sided  Cerebellum:            Appears normal         Abdomen:                Appears normal  Posterior Fossa:       Appears normal         Abdominal Wall:         Appears nml (cord                                                                        insert, abd wall)  Nuchal Fold:            Previously seen        Cord Vessels:           Appears normal (3  vessel cord)  Face:                  Appears normal         Kidneys:                Appear normal                         (orbits and profile)  Lips:                  Appears normal         Bladder:                Appears normal  Thoracic:              Appears normal         Spine:                  Previously seen  Heart:                 Appears normal         Upper Extremities:      Previously seen                         (4CH, axis, and                         situs)  RVOT:                  Appears normal         Lower Extremities:      Previously seen  LVOT:                  Appears normal  Other:  Heels previously visualized. Open hands previously visualized. Nasal          bone visualized. Technically difficult due to fetal position. ---------------------------------------------------------------------- Cervix Uterus Adnexa  Cervix  Length:            3.8  cm.  Normal appearance by transvaginal scan  Uterus  No abnormality visualized.  Left Ovary  Within normal limits.  Right Ovary  Not visualized.  Cul De Sac  No free fluid seen.  Adnexa  No abnormality visualized. ---------------------------------------------------------------------- Comments  U/S images reviewed. Findings reviewed with patient.  Appropriate fetal growth is noted.  No fetal abnormalities are  seen.  Placenta previa has resolved  Questions answered.  10 minutes spent face to face.  Recommendations: 1) Follow-up growth in 6 weeks 2)  Weekly BPP beginning @ 36 weeks ---------------------------------------------------------------------- Recommendations  1) Follow-up growth in 6 weeks 2) Weekly BPP beginning @  36 weeks ----------------------------------------------------------------------               Patsi Sears, MD Electronically Signed Final Report   12/23/2017 03:27 pm  ----------------------------------------------------------------------  [place] Mfm Ob Limited  Result Date: 01/05/2018 ----------------------------------------------------------------------  OBSTETRICS REPORT                       (Signed Final 01/05/2018 05:54 pm) ---------------------------------------------------------------------- Patient Info  ID #:       [id number]                          D.O.B.:  March 31, 1981 (36 yrs)  Name:       [facility]  Visit Date: 01/05/2018 05:48 pm ---------------------------------------------------------------------- Performed By  Performed By:     Eden Lathe BS      Ref. Address:     [address]                    RDMS RVT                                                             [address]                                                             [place], [place]                                                             [address]  Attending:        Noralee Space MD        Location:         [facility]  Referred By:      Tereso Newcomer MD ---------------------------------------------------------------------- Orders   #  Description                          Code         Ordered By   1  [place] MFM OB LIMITED                    60454.09     Clayton Bibles   2  [place] MFM OB TRANSVAGINAL               81191.4      Clayton Bibles  ----------------------------------------------------------------------   #  Order #                    Accession #                 Episode #   1  [id number]                  [id number]   2  [id number]                  [id number]  ---------------------------------------------------------------------- Indications   Vaginal bleeding in pregnancy, third trimester O46.93   Poor obstetric history: Previous preterm       O09.219   delivery, antepartum (@ 36wks,  placental   abruption; started 17P injections 09/14/17)   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     O99.280, E07.9   goiter)   Medical complication of pregnancy (Heart       O26.90   murmur)   Placenta previa specified as without           O44.03   hemorrhage, third trimester - resolved   [redacted] weeks gestation of pregnancy                Z3A.32  ---------------------------------------------------------------------- Vital Signs                                                 Height:        5'3" ---------------------------------------------------------------------- Fetal Evaluation  Num Of Fetuses:         1  Fetal Heart Rate(bpm):  128  Cardiac Activity:       Observed  Presentation:           Cephalic  Placenta:               Posterior  P. Cord Insertion:      Previously Visualized  Amniotic Fluid  AFI FV:      Within normal limits  AFI Sum(cm)     %Tile       Largest Pocket(cm)  15.2            54          6.18  RUQ(cm)       RLQ(cm)       LUQ(cm)        LLQ(cm)  4.2           2.36          2.46           6.18  Comment:    No placental abruption or previa identified. ---------------------------------------------------------------------- OB History  Gravidity:    3         Prem:   1         SAB:   1  Living:       1 ---------------------------------------------------------------------- Gestational Age  LMP:           32w 5d        Date:  05/21/17                 EDD:   02/25/18  Best:          [name]32w 5d     Det. By:  LMP  (05/21/17)          EDD:   02/25/18 ---------------------------------------------------------------------- Cervix Uterus Adnexa  Cervix  Length:            2.8  cm.  Measured transvaginally.  Uterus  No abnormality visualized.  Cul De Sac  No free fluid seen.  Adnexa  No abnormality visualized. ---------------------------------------------------------------------- Impression  Patient is being evaluated in  the MAU for c/o vaginal  bleeding. She reports that she had passed several blood  clots  yesterday and was admitted to [facility] (she  was visiting her relatives in [place]). No vaginal  examination was performed at the hospital. She was  discharged later.  She reports persistent slight vaginal bleeding today. No  abdominal pain or uterine contractions.  She had sexual intercourse 2 weeks ago.  A limited ultrasound was performed. Amniotic fluid is normal  and good fetal activity is seen. Placenta is posterior and the  entire placenta could not be evaluated because of overlying  fetus. No obvious placental abruption is seen. The cervix  measures 2.8 cm, which is normal. There is no placenta  previa.  Vaginal probe cover was not blood stained.  I reassured the patient of the findings and also informed her  that placental abruption cannot be ruled out on ultrasound.  Patient was transferred back to the MAU for further  management. ---------------------------------------------------------------------- Recommendations  Follow-up scans as clinically indicated. ----------------------------------------------------------------------                  Noralee Space, MD Electronically Signed Final Report   01/05/2018 05:54 pm ----------------------------------------------------------------------    Assessment and Plan: Principal Problem:   Third trimester bleeding, antepartum Active Problems:   History of placental abruption   Gestational diabetes   GBS bacteriuria   Preterm labor in third trimester  Admit to L&D Keep NPO for now.  CBGs q6h while NPO.  Will check labs, T&C for 3 units. Category I FHR tracing now; continuous monitoring and tocometry ordered.   Neonatology consulted, talked to Dr. Cleatis Polka Patient already received BMZ on 9/19-9/20, not a candidate for magnesium sulfate for neuroprotection. Patient was counseled about need for close observation, may need emergent/urgent cesarean section for worsening bleeding or deterioration of maternal/fetal status. If patient is stable  later today, may consider transfer to Antenatal Unit. Patient was counseled that she will remain inpatient until she has seven days without bleeding prior to considering possible discharge to home. Will recheck presentation if she does progress in preterm labor to determine route of delivery; tocolytics are contraindicated for now given presumed abruption. Routine unit care   Jaynie Collins, MD, FACOG Obstetrician & Gynecologist Faculty Practice, [facility]

## 2018-01-08 NOTE — Progress Notes (Signed)
[name] PollockMelinda Moreno is a 37 y.o. [phone] at 8491w1d by ultrasound admitted for Preterm labor  Subjective:   Objective: BP (!) 114/51   Pulse 81   Temp 98.1 F (36.7 C) (Oral)   Resp 18   Ht 5\' 3"  (1.6 m)   Wt 87.1 kg   LMP 05/21/2017 (Exact Date)   BMI 34.01 kg/m  I/O last 3 completed shifts: In: -  Out: 3 [Blood:3] Total I/O In: 322.1 [I.V.:322.1] Out: 12 [Blood:12]  FHT:  FHR: 140's bpm, variability: moderate,  accelerations:  Present,  decelerations:  Absent UC:   occasional SVE:   Dilation: 3 Effacement (%): 60 Station: -2 Exam by:: Dr. Macon LargeAnyanwu  Labs: Lab Results  Component Value Date   WBC 11.5 (H) 01/08/2018   HGB 9.5 (L) 01/08/2018   HCT 28.8 (L) 01/08/2018   MCV 74.2 (L) 01/08/2018   PLT 196 01/08/2018   PLT 187 01/08/2018    Assessment / Plan: vaginal bleeding and contractions  Close observation Labor: preterm labor secondary to abruption Preeclampsia:  no signs or symptoms of toxicity Fetal Wellbeing:  Category I Pain Control:  Labor support without medications I/D:  n/a Anticipated MOD:  NSVD  [name] DuskyMarie Little Moreno 01/08/2018, 10:33 AM

## 2018-01-08 NOTE — MAU Note (Signed)
Pt stated she felt a gush and saw vaginal bleeding and clot since 0100. Positive FM. Was here previously for vaginal bleeding

## 2018-01-09 LAB — IRON AND TIBC
Iron: 56 ug/dL (ref 28–170)
Saturation Ratios: 15 % (ref 10.4–31.8)
TIBC: 370 ug/dL (ref 250–450)
UIBC: 314 ug/dL

## 2018-01-09 LAB — RETICULOCYTES
RBC.: 3.59 MIL/uL — AB (ref 3.87–5.11)
RETIC COUNT ABSOLUTE: 75.4 10*3/uL (ref 19.0–186.0)
RETIC CT PCT: 2.1 % (ref 0.4–3.1)

## 2018-01-09 LAB — CBC
HCT: 26.7 % — ABNORMAL LOW (ref 36.0–46.0)
HEMOGLOBIN: 8.9 g/dL — AB (ref 12.0–15.0)
MCH: 24.8 pg — AB (ref 26.0–34.0)
MCHC: 33.3 g/dL (ref 30.0–36.0)
MCV: 74.4 fL — ABNORMAL LOW (ref 78.0–100.0)
PLATELETS: 161 10*3/uL (ref 150–400)
RBC: 3.59 MIL/uL — AB (ref 3.87–5.11)
RDW: 14.7 % (ref 11.5–15.5)
WBC: 8.7 10*3/uL (ref 4.0–10.5)

## 2018-01-09 LAB — GLUCOSE, CAPILLARY
GLUCOSE-CAPILLARY: 110 mg/dL — AB (ref 70–99)
GLUCOSE-CAPILLARY: 90 mg/dL (ref 70–99)
GLUCOSE-CAPILLARY: 111 mg/dL — AB (ref 70–99)

## 2018-01-09 LAB — FERRITIN: Ferritin: 24 ng/mL (ref 11–307)

## 2018-01-09 LAB — FOLATE: FOLATE: 19.2 ng/mL (ref >5.9)

## 2018-01-09 LAB — VITAMIN B12: VITAMIN B 12: 164 pg/mL — AB (ref 180–914)

## 2018-01-09 LAB — RPR: RPR Ser Ql: NONREACTIVE

## 2018-01-09 MED ORDER — DOCUSATE SODIUM 100 MG PO CAPS
100.0000 mg | ORAL_CAPSULE | Freq: Two times a day (BID) | ORAL | Status: DC | PRN
  Administered 2018-01-09: 100 mg via ORAL

## 2018-01-09 MED ORDER — ZOLPIDEM TARTRATE 5 MG PO TABS
5.0000 mg | ORAL_TABLET | Freq: Once | ORAL | Status: AC
  Administered 2018-01-09: 5 mg via ORAL
  Filled 2018-01-09: qty 1

## 2018-01-09 NOTE — Progress Notes (Signed)
FACULTY PRACTICE ANTEPARTUM(COMPREHENSIVE) NOTE  [name] PollockMelinda Moreno is a 37 y.o. [id number] at 4568w2d  who is admitted for vaginal bleeding, presumed abruption, A1GDM.   Fetal presentation is cephalic. Length of Stay:  1  Days  Subjective: Pt had a small 3 cm x 7 cm area of dark old blood on this mornings pad. Patient reports the fetal movement as active. Patient reports uterine contraction  activity as none. Patient reports  vaginal bleeding as spotting. Patient describes fluid per vagina as None.  Vitals:  Blood pressure 105/73, pulse 78, temperature 98 F (36.7 C), temperature source Oral, resp. rate 18, height 5\' 3"  (1.6 m), weight 87.1 kg, last menstrual period 05/21/2017, SpO2 100 %. Physical Examination:  General appearance - alert, well appearing, and in no distress, oriented to person, place, and time and normal appearing weight Heart - normal rate and regular rhythm Abdomen - soft, nontender, nondistended Fundal Height:  size equals dates Cervical Exam: Not evaluated.  fetal presentation is cephalic by u/s Extremities: extremities normal, atraumatic, no cyanosis or edema and Homans sign is negative, no sign of DVT with DTRs 2+ bilaterally Membranes:intact  Fetal Monitoring:  Baseline: 135 bpm, Variability: Good {> 6 bpm) and Accelerations: Reactive  Labs:  Results for orders placed or performed during the hospital encounter of 01/08/18 (from the past 24 hour(s))  Glucose, capillary   Collection Time: 01/08/18 10:07 AM  Result Value Ref Range   Glucose-Capillary 80 70 - 99 mg/dL   Comment 1 Notify RN    Comment 2 Document in Chart     Imaging Studies:      Medications:  Scheduled . sodium chloride   Intravenous Once  . aspirin EC  81 mg Oral Daily  . docusate sodium  100 mg Oral Daily  . famotidine  20 mg Oral BID   I have reviewed the patient's current medications. Aspirin d/c'd  ASSESSMENT: Patient Active Problem List   Diagnosis Date Noted  . Preterm labor in  third trimester 01/08/2018  . GBS bacteriuria 01/02/2018  . Gestational diabetes 12/03/2017  . Benign cyst of right breast 10/13/2017  . Supervision of high-risk pregnancy 08/15/2017  . Third trimester bleeding, antepartum 08/15/2017  . Previous preterm delivery at 36 weeks, antepartum 08/15/2017  . History of placental abruption 08/15/2017  . Depression   . Multinodular goiter 01/04/2012    PLAN: 1. Continued inpt care x 7 d. As per protocol 2 d/c ASA 81. 3 bedrest / brp   [name] Moreno 01/09/2018,7:12 AM    Patient ID: [name] Moreno, female   DOB: October 01, 1980, 37 y.o.   MRN: [id number]

## 2018-01-09 NOTE — Progress Notes (Signed)
I have reviewed the chart and agree with nursing staff's documentation of this patient's encounter.  Ashlee Player, MD 01/09/2018 11:22 AM    

## 2018-01-10 ENCOUNTER — Inpatient Hospital Stay (HOSPITAL_COMMUNITY): Payer: Medicaid Other

## 2018-01-10 DIAGNOSIS — O99013 Anemia complicating pregnancy, third trimester: Secondary | ICD-10-CM | POA: Diagnosis present

## 2018-01-10 DIAGNOSIS — R609 Edema, unspecified: Secondary | ICD-10-CM

## 2018-01-10 DIAGNOSIS — O99012 Anemia complicating pregnancy, second trimester: Secondary | ICD-10-CM | POA: Diagnosis present

## 2018-01-10 DIAGNOSIS — O99019 Anemia complicating pregnancy, unspecified trimester: Secondary | ICD-10-CM | POA: Diagnosis present

## 2018-01-10 LAB — CBC
HCT: 27.2 % — ABNORMAL LOW (ref 36.0–46.0)
Hemoglobin: 9 g/dL — ABNORMAL LOW (ref 12.0–15.0)
MCH: 24.4 pg — AB (ref 26.0–34.0)
MCHC: 33.1 g/dL (ref 30.0–36.0)
MCV: 73.7 fL — ABNORMAL LOW (ref 78.0–100.0)
PLATELETS: 166 10*3/uL (ref 150–400)
RBC: 3.69 MIL/uL — ABNORMAL LOW (ref 3.87–5.11)
RDW: 14.7 % (ref 11.5–15.5)
WBC: 8.5 10*3/uL (ref 4.0–10.5)

## 2018-01-10 LAB — GLUCOSE, CAPILLARY
GLUCOSE-CAPILLARY: 72 mg/dL (ref 70–99)
Glucose-Capillary: 96 mg/dL (ref 70–99)
Glucose-Capillary: 119 mg/dL — ABNORMAL HIGH (ref 70–99)

## 2018-01-10 MED ORDER — POLYETHYLENE GLYCOL 3350 17 G PO PACK: 17.0000 g | PACK | Freq: Every day | ORAL | Status: DC | PRN

## 2018-01-10 MED ORDER — ZOLPIDEM TARTRATE 5 MG PO TABS
5.0000 mg | ORAL_TABLET | Freq: Every evening | ORAL | Status: DC | PRN
  Administered 2018-01-10 – 2018-01-11 (×2): 5 mg via ORAL
  Filled 2018-01-10 (×2): qty 1

## 2018-01-10 MED ORDER — SODIUM CHLORIDE 0.9 % IV SOLN
510.0000 mg | INTRAVENOUS | Status: DC
  Administered 2018-01-10: 510 mg via INTRAVENOUS
  Filled 2018-01-10: qty 17

## 2018-01-10 NOTE — Progress Notes (Signed)
Daily Antepartum Note  Admission Date: 01/08/2018 Current Date: 01/10/2018 10:00 AM  [name] PollockMelinda Moreno is a 37 y.o. [id number] @ 4126w3d, HD#3, admitted for VB.  Pregnancy complicated by: Patient Active Problem List   Diagnosis Date Noted  . Preterm labor in third trimester 01/08/2018  . GBS bacteriuria 01/02/2018  . Gestational diabetes 12/03/2017  . Benign cyst of right breast 10/13/2017  . Supervision of high-risk pregnancy 08/15/2017  . Third trimester bleeding, antepartum 08/15/2017  . Previous preterm delivery at 36 weeks, antepartum 08/15/2017  . History of placental abruption 08/15/2017  . Depression   . Multinodular goiter 01/04/2012    Overnight/24hr events:  Just dark blood spotting  Subjective:  Dark spotting (last seen last night), no UCs or decreased FM  Objective:    Current Vital Signs 24h Vital Sign Ranges  T 98.1 F (36.7 C) Temp  Avg: 98.1 F (36.7 C)  Min: 98.1 F (36.7 C)  Max: 98.2 F (36.8 C)  BP (pt sleeping) BP  Min: 122/60  Max: 129/70  HR 78 Pulse  Avg: 72.5  Min: 63  Max: 79  RR 18 Resp  Avg: 17.5  Min: 16  Max: 18  SaO2 100 % Room Air SpO2  Avg: 100 %  Min: 100 %  Max: 100 %       24 Hour I/O Current Shift I/O  Time Ins Outs No intake/output data recorded. No intake/output data recorded.   Patient Vitals for the past 24 hrs:  BP Temp Pulse Resp SpO2  01/10/18 0611 122/60 98.1 F (36.7 C) 78 18 100 %  01/09/18 2343 127/63 98.1 F (36.7 C) 70 18 100 %  01/09/18 1948 129/70 98.2 F (36.8 C) 63 16 100 %  01/09/18 1623 127/70 - 79 18 100 %  AM EFM 145 baseline, +accels, no decel, mod variability Toco quiet PM EFM: 135 baseline, +accels, no decel, mod variability Toco: quiet EFM: 135 baseline, +accels, no decel, mod variability Toco: quiet  Physical exam: General: Well nourished, well developed female in no acute distress. Abdomen: gravid nttp Cardiovascular: S1, S2 normal, no murmur, rub or gallop, regular rate and  rhythm Respiratory: CTAB Extremities: no clubbing, cyanosis or edema Skin: Warm and dry.   Medications: Current Facility-Administered Medications  Medication Dose Route Frequency Provider Last Rate Last Dose  . 0.9 %  sodium chloride infusion (Manually program via Guardrails IV Fluids)   Intravenous Once Reva BoresPratt, Tanya S, MD      . acetaminophen (TYLENOL) tablet 650 mg  650 mg Oral Q4H PRN Reva BoresPratt, Tanya S, MD   650 mg at 01/09/18 1804  . calcium carbonate (TUMS - dosed in mg elemental calcium) chewable tablet 400 mg of elemental calcium  2 tablet Oral Q4H PRN Reva BoresPratt, Tanya S, MD      . docusate sodium (COLACE) capsule 100 mg  100 mg Oral BID PRN [place] BingPickens, Vaden Becherer, MD   100 mg at 01/09/18 1014  . famotidine (PEPCID) tablet 20 mg  20 mg Oral BID Reva BoresPratt, Tanya S, MD   20 mg at 01/10/18 0940  . fluticasone (FLONASE) 50 MCG/ACT nasal spray 1 spray  1 spray Each Nare Daily PRN Reva BoresPratt, Tanya S, MD   1 spray at 01/09/18 1013    Labs:  Recent Labs  Lab 01/08/18 0350 01/09/18 0825 01/10/18 0709  WBC 11.5* 8.7 8.5  HGB 9.5* 8.9* 9.0*  HCT 28.8* 26.7* 27.2*  PLT 187  196 161 166   Results for [name] PollockWHITMORE, [name] (MRN [id number]) as  of 01/10/2018 10:38  Ref. Range 01/09/2018 17:43 01/09/2018 22:32 01/10/2018 06:13  Glucose-Capillary Latest Ref Range: 70 - 99 mg/dL 90 161 (H) 72   Normal Iron, TIBC, folate, ferritin, b12  Radiology: no new imaging 9/23: ceph, normal AFI, normal placenta 9/6: efw 48%, 1588gm, AC 32%, normal AF  Assessment & Plan:  Pt stable *Pregnancy: routine care. Continue with bid NSTs *VB in pregnancy *Anemia: pt amenable to ferraheme. Ferritin normal but should drop given need for increased blood. H/o normal hemoglobin electrophoresis *h/o PTB: 17p on friday *Preterm: BMZ on 9/19-20. S/p NICU consult already *GDMa1: no issues *PPx: SCDs, OOB ad lib *FEN/GI: SLIV, GDM diet *Dispo: d/w pt at least this week but will d/w team on Friday about possible prolonged admission given  her history and # of bleeds this pregnancy; pt strongly desire outpatient management.   Cornelia Copa MD Attending Center for [facility])

## 2018-01-10 NOTE — Progress Notes (Addendum)
Initial Nutrition Assessment  DOCUMENTATION CODES:  Not applicable INTERVENTION:  Carbohydrate modified gestational diabaetic dieet Double protein portions if requested NUTRITION DIAGNOSIS:  Increased nutrient needs related to (pregnancy and fetal growth requirements) as evidenced by (33 weeks IUP). GOAL:  Patient will meet greater than or equal to 90% of their needs MONITOR:  Weight trends REASON FOR ASSESSMENT:  Antenatal, Gestational Diabetes   ASSESSMENT:  33 3/7, adm due to vaginal bleed. Diet controlled GDM.  weight on 07/07/17 158 lbs, BMI 28.1. 33 lb weight gain. Hx of n/v in early pregnancy Hct 27.2%, noted feraheme order  Diet Order:   Diet Order            Diet gestational carb mod Fluid consistency: Thin; Room service appropriate? Yes  Diet effective now             EDUCATION NEEDS:   No education needs have been identified at this time(outpt education on GDM diet :12/21/17)  Skin:  Skin Assessment: Reviewed RN Assessment  Last BM:  9/22  Height:   Ht Readings from Last 1 Encounters:  01/08/18 5\' 3"  (1.6 m)   Weight:   Wt Readings from Last 1 Encounters:  01/08/18 87.1 kg   Ideal Body Weight:   115 lbs  BMI:  Body mass index is 34.01 kg/m.  Estimated Nutritional Needs:   Kcal:  1900-2100  Protein:  85-95 g  Fluid:  2.2 L    Elisabeth CaraKatherine Ethelwyn Gilbertson M.Odis LusterEd. R.D. LDN Neonatal Nutrition Support Specialist/RD III Pager [phone]      Phone [phone]

## 2018-01-10 NOTE — Progress Notes (Signed)
Bilateral lower extremity venous duplex completed - preliminary results - There is no evidence of DVT or Baker's cyst. Toma DeitersVirginia Ruthellen Tippy, RVS 01/10/2018 11:32 AM

## 2018-01-11 ENCOUNTER — Encounter: Payer: Medicaid Other | Admitting: Family Medicine

## 2018-01-11 LAB — COMPREHENSIVE METABOLIC PANEL
ALBUMIN: 2.7 g/dL — AB (ref 3.5–5.0)
ALK PHOS: 74 U/L (ref 38–126)
ALT: 16 U/L (ref 0–44)
AST: 19 U/L (ref 15–41)
Anion gap: 9 (ref 5–15)
BILIRUBIN TOTAL: 0.7 mg/dL (ref 0.3–1.2)
BUN: 9 mg/dL (ref 6–20)
CALCIUM: 8.3 mg/dL — AB (ref 8.9–10.3)
CO2: 20 mmol/L — ABNORMAL LOW (ref 22–32)
Chloride: 106 mmol/L (ref 98–111)
Creatinine, Ser: 0.54 mg/dL (ref 0.44–1.00)
GFR calc Af Amer: >60 mL/min (ref >60)
GFR calc non Af Amer: >60 mL/min (ref >60)
Glucose, Bld: 103 mg/dL — ABNORMAL HIGH (ref 70–99)
Potassium: 3.4 mmol/L — ABNORMAL LOW (ref 3.5–5.1)
Sodium: 135 mmol/L (ref 135–145)
TOTAL PROTEIN: 6.4 g/dL — AB (ref 6.5–8.1)

## 2018-01-11 LAB — GLUCOSE, CAPILLARY
GLUCOSE-CAPILLARY: 77 mg/dL (ref 70–99)
Glucose-Capillary: 126 mg/dL — ABNORMAL HIGH (ref 70–99)
Glucose-Capillary: 108 mg/dL — ABNORMAL HIGH (ref 70–99)
Glucose-Capillary: 121 mg/dL — ABNORMAL HIGH (ref 70–99)

## 2018-01-11 LAB — CBC
HEMATOCRIT: 28 % — AB (ref 36.0–46.0)
HEMOGLOBIN: 9.5 g/dL — AB (ref 12.0–15.0)
MCH: 25.1 pg — ABNORMAL LOW (ref 26.0–34.0)
MCHC: 33.9 g/dL (ref 30.0–36.0)
MCV: 73.9 fL — AB (ref 78.0–100.0)
Platelets: 169 10*3/uL (ref 150–400)
RBC: 3.79 MIL/uL — ABNORMAL LOW (ref 3.87–5.11)
RDW: 14.8 % (ref 11.5–15.5)
WBC: 8.3 10*3/uL (ref 4.0–10.5)

## 2018-01-11 LAB — PROTEIN / CREATININE RATIO, URINE
Creatinine, Urine: 78 mg/dL
Protein Creatinine Ratio: 0.1 mg/mg{Cre} (ref 0.00–0.15)
Total Protein, Urine: 8 mg/dL

## 2018-01-11 MED ORDER — POLYETHYLENE GLYCOL 3350 17 G PO PACK
17.0000 g | PACK | Freq: Two times a day (BID) | ORAL | Status: DC
  Administered 2018-01-11 – 2018-01-13 (×5): 17 g via ORAL
  Filled 2018-01-11 (×5): qty 1

## 2018-01-11 MED ORDER — COMPLETENATE 29-1 MG PO CHEW
1.0000 | CHEWABLE_TABLET | Freq: Every day | ORAL | Status: DC
  Administered 2018-01-11 – 2018-01-12 (×2): 1 via ORAL
  Filled 2018-01-11 (×2): qty 1

## 2018-01-11 MED ORDER — BISACODYL 5 MG PO TBEC
5.0000 mg | DELAYED_RELEASE_TABLET | Freq: Once | ORAL | Status: DC
  Filled 2018-01-11: qty 1

## 2018-01-11 NOTE — Progress Notes (Signed)
Daily Antepartum Note  Admission Date: 01/08/2018 Current Date: 01/11/2018 10:30 AM  [name] PollockMelinda Moreno is a 37 y.o. [id number] @ 563w4d, HD#4, admitted for VB.  Pregnancy complicated by: Patient Active Problem List   Diagnosis Date Noted  . Anemia affecting pregnancy in third trimester 01/10/2018  . Preterm labor in third trimester 01/08/2018  . GBS bacteriuria 01/02/2018  . Gestational diabetes 12/03/2017  . Benign cyst of right breast 10/13/2017  . Supervision of high-risk pregnancy 08/15/2017  . Third trimester bleeding, antepartum 08/15/2017  . Previous preterm delivery at 36 weeks, antepartum 08/15/2017  . History of placental abruption 08/15/2017  . Depression   . Multinodular goiter 01/04/2012    Overnight/24hr events:  None  Subjective:  No PTL or decreased FM Moreno/Moreno.   Last bleed: 2/23, spotting  Objective:    Current Vital Signs 24h Vital Sign Ranges  T 98.2 F (36.8 C) Temp  Avg: 98 F (36.7 C)  Min: 97.7 F (36.5 C)  Max: 98.6 F (37 C)  BP 110/62 BP  Min: 110/62  Max: 150/67  HR 81 Pulse  Avg: 77.3  Min: 70  Max: 81  RR 18 Resp  Avg: 17  Min: 16  Max: 18  SaO2 100 % Room Air SpO2  Avg: 99.7 %  Min: 98 %  Max: 100 %       24 Hour I/O Current Shift I/O  Time Ins Outs No intake/output data recorded. No intake/output data recorded.   Patient Vitals for the past 24 hrs:  BP Temp Temp src Pulse Resp SpO2  01/11/18 0906 110/62 98.2 F (36.8 C) Oral 81 18 100 %  01/11/18 0418 (!) 150/67 97.8 F (36.6 C) Oral 74 17 98 %  01/10/18 2342 (!) 143/74 97.7 F (36.5 C) Oral 78 16 100 %  01/10/18 1948 136/74 98.6 F (37 C) Oral 70 16 100 %  01/10/18 1556 130/65 97.7 F (36.5 C) Oral 80 - 100 %  01/10/18 1137 131/70 98 F (36.7 C) Oral 81 18 100 %  AM EFM 135 baseline, +accels, no decel, mod variability Toco quiet PM EFM: 125 baseline, +accels, no decel, mod variability Toco: quiet EFM: 135 baseline, +accels, no decel, mod variability Toco: quiet  Physical  exam: General: Well nourished, well developed female in no acute distress. Abdomen: gravid nttp Cardiovascular: S1, S2 normal, no murmur, rub or gallop, regular rate and rhythm Respiratory: CTAB Extremities: no clubbing, cyanosis or edema Skin: Warm and dry.   Medications: Current Facility-Administered Medications  Medication Dose Route Frequency Provider Last Rate Last Dose  . 0.9 %  sodium chloride infusion (Manually program via Guardrails IV Fluids)   Intravenous Once [name] Moreno, [name] Moreno, [name] Moreno      . acetaminophen (TYLENOL) tablet 650 mg  650 mg Oral Q4H PRN [name] Moreno, [name] Moreno, [name] Moreno   650 mg at 01/09/18 1804  . bisacodyl (DULCOLAX) EC tablet 5 mg  5 mg Oral Once [name] Moreno, [name] Ehly, [name] Moreno      . calcium carbonate (TUMS - dosed in mg elemental calcium) chewable tablet 400 mg of elemental calcium  2 tablet Oral Q4H PRN [name] Moreno, [name] Moreno, [name] Moreno      . famotidine (PEPCID) tablet 20 mg  20 mg Oral BID [name] Moreno, [name] Moreno, [name] Moreno   20 mg at 01/10/18 2252  . ferumoxytol (FERAHEME) 510 mg in sodium chloride 0.9 % 100 mL IVPB  510 mg Intravenous Weekly [name] Moreno, [name] Kolden, [name] Moreno 468 mL/hr at 01/10/18 1135 510 mg at 01/10/18 1135  . fluticasone (  FLONASE) 50 MCG/ACT nasal spray 1 spray  1 spray Each Nare Daily PRN [name] Bores, [name] Moreno   1 spray at 01/09/18 1013  . polyethylene glycol (MIRALAX / GLYCOLAX) packet 17 g  17 g Oral BID [place] Bing, [name] Moreno      . zolpidem (AMBIEN) tablet 5 mg  5 mg Oral QHS PRN [name] Heritage, [name] Moreno   5 mg at 01/10/18 2252    Labs:  Recent Labs  Lab 01/08/18 0350 01/09/18 0825 01/10/18 0709  WBC 11.5* 8.7 8.5  HGB 9.5* 8.9* 9.0*  HCT 28.8* 26.7* 27.2*  PLT 187  196 161 166   Results for [name], Moreno (MRN [id number]) as of 01/11/2018 11:19  Ref. Range 01/10/2018 11:32 01/10/2018 23:04 01/11/2018 08:59 01/11/2018 10:17  Glucose-Capillary Latest Ref Range: 70 - 99 mg/dL 96 409 (H) 77    Radiology: no new imaging 9/23: ceph, normal AFI, normal placenta 9/6: efw 48%, 1588gm, AC 32%, normal  AF  Assessment & Plan:  Pt stable *Pregnancy: routine care. Continue with tid NSTs *VB in pregnancy *Anemia: Moreno/p ferraheme on 9/24. Rpt one week *h/o PTB: 17p on friday *Preterm: BMZ on 9/19-20. Moreno/p NICU consult already *GDMa1: no issues *PPx: SCDs, OOB ad lib *FEN/GI: SLIV, GDM diet *Dispo: d/w pt at least this week but will d/w team on Friday about possible prolonged admission given her history and # of bleeds this pregnancy; pt strongly desire outpatient management.   [name] Copa [name] Moreno Attending Center for [facility])

## 2018-01-11 NOTE — Discharge Summary (Signed)
Discharge Summary   Admit Date: 01/08/2018 Discharge Date: 01/11/2018 Discharging Service: Antepartum  Primary OBGYN: [facility] Admitting Physician: Tereso Newcomer, MD  Discharge Physician: Vergie Living  Referring Provider: Self  Admission Diagnoses: *Pregnancy at 33/1 *Vaginal bleeding *GDMa1 *GBS bacteruria *History of preterm birth  Discharge Diagnoses: *Pregnancy at 33/3 *Resolved vaginal bleeding *?Gestational HTN  Consult Orders: IP CONSULT TO NEONATOLOGY   Surgeries/Procedures Performed: Same  History and Physical: FACULTY PRACTICE ANTEPARTUM ADMISSION HISTORY AND PHYSICAL NOTE   History of Present Illness: [name] Moreno is a 37 y.o. [id number] at [redacted]w[redacted]d admitted for vaginal bleeding in the third trimester/presumed placental abruption. Patient has been seen in MAU since 01/05/18 for bleeding. She reported having significant bleeding on 01/04/18 for which she was evaluated at [facility], given IV fluids and sent home after observation in the ED with plans for outpatient follow up.  During her MAU visit on 01/05/18, she had scant bleeding noted, and ultrasound showed a posterior placenta without obvious abruption. She was given betamethasone regimen on 9/19-9/20 in MAU; had no bleeding on exam during her secondary evaluation in MAU on 01/06/18.  Always had Category I FHR tracing during her visits. She was also seen at the [facility]- office on 01/06/18 and received her weekly 17P injection.   Patient reported feeling okay until 0100 this morning when she woke up after feeling a "pop sensation" down there and noticing a puddle of blood and clots. She had lower abdominal cramping, no overt contractions. No LOF. Good FM. She returned to MAU for evaluation.  On evaluation today, she reports abdominal cramping and bleeding as above.  No signs/symptoms of anemia. No other systemic symptoms.       Patient Active Problem List   Diagnosis Date Noted    . Preterm labor in third trimester 01/08/2018  . GBS bacteriuria 01/02/2018  . Gestational diabetes 12/03/2017  . Benign cyst of right breast 10/13/2017  . Supervision of high-risk pregnancy 08/15/2017  . Third trimester bleeding, antepartum 08/15/2017  . Previous preterm delivery at 36 weeks, antepartum 08/15/2017  . History of placental abruption 08/15/2017  . Depression   . Multinodular goiter 01/04/2012        Past Medical History:  Diagnosis Date  . Depression   . Headache   . Heart murmur   . Multinodular goiter   . Thyroid nodule     History reviewed. No pertinent surgical history.                  OB History  Gravida Para Term Preterm AB Living  3 1 0 1 1 1   SAB TAB Ectopic Multiple Live Births     1 0 0 0 1       # Outcome Date GA Lbr Len/2nd Weight Sex Delivery Anes PTL Lv  3 Current           2 SAB 04/19/17     SAB     1 Preterm 05/29/03 [redacted]w[redacted]d    Vag-Spont   LIV     Birth Comments: Had "placental separation" leading to preterm delivery     Complications: Abruptio Placenta    Social History        Socioeconomic History  . Marital status: Single    Spouse name: Not on file  . Number of children: Not on file  . Years of education: 75  . Highest education level: Not on file  Occupational History  . Occupation: CNA  Social Needs  .  Financial resource strain: Not on file  . Food insecurity:    Worry: Not on file    Inability: Not on file  . Transportation needs:    Medical: Not on file    Non-medical: Not on file  Tobacco Use  . Smoking status: Former Smoker    Packs/day: 0.30    Types: Cigarettes    Start date: 07/01/2017  . Smokeless tobacco: Never Used  Substance and Sexual Activity  . Alcohol use: Yes    Comment: occas. when not preg  . Drug use: No  . Sexual activity: Yes    Partners: Male    Birth control/protection: None  Lifestyle  . Physical activity:    Days per week:  Not on file    Minutes per session: Not on file  . Stress: Not on file  Relationships  . Social connections:    Talks on phone: Not on file    Gets together: Not on file    Attends religious service: Not on file    Active member of club or organization: Not on file    Attends meetings of clubs or organizations: Not on file    Relationship status: Not on file  Other Topics Concern  . Not on file  Social History Narrative  . Not on file         Family History  Problem Relation Age of Onset  . Hypertension Other        Grandparents  . Diabetes Other        Grandparents    No Known Allergies         Medications Prior to Admission  Medication Sig Dispense Refill Last Dose  . ACCU-CHEK FASTCLIX LANCETS MISC Check blood sugars up to  4 times a day  024.410 100 each 12 Taking  . ACCU-CHEK FASTCLIX LANCETS MISC CHECK BLOOD SUGARS UP TO 4 TIMES A DAY 100 each 12 Taking  . aspirin EC 81 MG tablet Take 1 tablet (81 mg total) by mouth daily. Take after 12 weeks for prevention of preeclampsia later in pregnancy 300 tablet 2 Taking  . Doxylamine-Pyridoxine ER (BONJESTA) 20-20 MG TBCR Take 1 tablet by mouth at bedtime. May add 1 tab midday if needed 60 tablet 6 Taking  . famotidine (PEPCID) 20 MG tablet Take 1 tablet (20 mg total) by mouth 2 (two) times daily. 60 tablet 3 Taking  . fluticasone (FLONASE) 50 MCG/ACT nasal spray Place 1 spray into both nostrils daily as needed for allergies or rhinitis.   Taking  . glucose blood test strip Check sugar 4 times a day 250.00 100 each 12 Taking  . nitrofurantoin, macrocrystal-monohydrate, (MACROBID) 100 MG capsule Take 1 capsule (100 mg total) by mouth 2 (two) times daily. 14 capsule 0 Taking  . Prenatal Vit-Fe Fumarate-FA (PREPLUS) 27-1 MG TABS Take 1 tablet by mouth daily. 30 tablet 13 Taking    Review of Systems - Negative except what is mentioned in HPI  Vitals:  BP 124/62   Pulse 82   Temp 97.9 F (36.6 C)  (Oral)   Resp 18   Ht 5\' 3"  (1.6 m)   Wt 87.1 kg   LMP 05/21/2017 (Exact Date)   BMI 34.01 kg/m  Physical Examination: CONSTITUTIONAL: Well-developed, well-nourished female in no acute distress.  HENT:  Normocephalic, atraumatic, External right and left ear normal. Oropharynx is clear and moist EYES: Conjunctivae and EOM are normal. Pupils are equal, round, and reactive to light. No scleral icterus.  NECK:  Normal range of motion, supple, no masses SKIN: Skin is warm and dry. No rash noted. Not diaphoretic. No erythema. No pallor. NEUROLGIC: Alert and oriented to person, place, and time. Normal reflexes, muscle tone coordination. No cranial nerve deficit noted. PSYCHIATRIC: Normal mood and affect. Normal behavior. Normal judgment and thought content. CARDIOVASCULAR: Normal heart rate noted, regular rhythm RESPIRATORY: Effort and breath sounds normal, no problems with respiration noted ABDOMEN: Soft, nontender, nondistended, gravid. MUSCULOSKELETAL: Normal range of motion. No edema and no tenderness. 2+ distal pulses.  Cervix: Evaluated by sterile speculum exam. and Evaluated by digital exam. and found to be 3 cm/ 60%/-2 and fetal presentation is cephalic which was verified on bedside ultrasound. Had small clots that were evacuated from vagina, no active bleeding noted.  Membranes:intact Fetal Monitoring:Baseline: 130 bpm, Variability: moderate, Accelerations: Reactive and Decelerations: Absent Tocometer: irritability  Labs:  No results found for this or any previous visit (from the past 24 hour(s)).  Imaging Studies: 01/08/18 [place] MFM OB Limited Preliminary Read: No obvious abruption. Cephalic. Normal AFV.   ImagingResults  [place] Mfm Ob Transvaginal  Result Date: 01/05/2018 ----------------------------------------------------------------------  OBSTETRICS REPORT                       (Signed Final 01/05/2018 05:54 pm)  ---------------------------------------------------------------------- Patient Info  ID #:       [id number]                          D.O.B.:  October 07, 1980 (36 yrs)  Name:       [facility]                Visit Date: 01/05/2018 05:48 pm ---------------------------------------------------------------------- Performed By  Performed By:     Eden Lathe BS      Ref. Address:     [address]                                                             [address]                                                             [place], [place]                                                             [address]  Attending:        Noralee Space MD        Location:         [facility]  Referred By:      Tereso Newcomer MD ---------------------------------------------------------------------- Orders   #  Description  Code         Ordered By   1  [place] MFM OB LIMITED                    76815.01     Clayton Bibles   2  [place] MFM OB TRANSVAGINAL               16109.6      Clayton Bibles  ----------------------------------------------------------------------   #  Order #                    Accession #                 Episode #   1  [id number]                  [id number]                  [id number]   2  [id number]                  [id number]                  [id number]  ---------------------------------------------------------------------- Indications   Vaginal bleeding in pregnancy, third trimester O46.93   Poor obstetric history: Previous preterm       O09.219   delivery, antepartum (@ 36wks, placental   abruption; started 17P injections 09/14/17)   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     O99.280, E07.9   goiter)   Medical complication of pregnancy (Heart       O26.90   murmur)   Placenta previa specified as without           O44.03   hemorrhage, third  trimester - resolved   [redacted] weeks gestation of pregnancy                Z3A.32  ---------------------------------------------------------------------- Vital Signs                                                 Height:        5'3" ---------------------------------------------------------------------- Fetal Evaluation  Num Of Fetuses:         1  Fetal Heart Rate(bpm):  128  Cardiac Activity:       Observed  Presentation:           Cephalic  Placenta:               Posterior  P. Cord Insertion:      Previously Visualized  Amniotic Fluid  AFI FV:      Within normal limits  AFI Sum(cm)     %Tile       Largest Pocket(cm)  15.2            54          6.18  RUQ(cm)       RLQ(cm)       LUQ(cm)        LLQ(cm)  4.2           2.36          2.46           6.18  Comment:    No placental abruption or previa identified. ---------------------------------------------------------------------- OB History  Gravidity:    3         Prem:   1         SAB:   1  Living:       1 ---------------------------------------------------------------------- Gestational Age  LMP:           32w 5d        Date:  05/21/17                 EDD:   02/25/18  Best:          [name] 5d     Det. By:  LMP  (05/21/17)          EDD:   02/25/18 ---------------------------------------------------------------------- Cervix Uterus Adnexa  Cervix  Length:            2.8  cm.  Measured transvaginally.  Uterus  No abnormality visualized.  Cul De Sac  No free fluid seen.  Adnexa  No abnormality visualized. ---------------------------------------------------------------------- Impression  Patient is being evaluated in the MAU for c/o vaginal  bleeding. She reports that she had passed several blood  clots yesterday and was admitted to [facility] (she  was visiting her relatives in [place]). No vaginal  examination was performed at the hospital. She was  discharged later.  She reports persistent slight vaginal bleeding today. No  abdominal pain or uterine contractions.   She had sexual intercourse 2 weeks ago.  A limited ultrasound was performed. Amniotic fluid is normal  and good fetal activity is seen. Placenta is posterior and the  entire placenta could not be evaluated because of overlying  fetus. No obvious placental abruption is seen. The cervix  measures 2.8 cm, which is normal. There is no placenta  previa.  Vaginal probe cover was not blood stained.  I reassured the patient of the findings and also informed her  that placental abruption cannot be ruled out on ultrasound.  Patient was transferred back to the MAU for further  management. ---------------------------------------------------------------------- Recommendations  Follow-up scans as clinically indicated. ----------------------------------------------------------------------                  Noralee Space, MD Electronically Signed Final Report   01/05/2018 05:54 pm ----------------------------------------------------------------------  [place] Mfm Ob Transvaginal  Result Date: 12/23/2017 ----------------------------------------------------------------------  OBSTETRICS REPORT                       (Signed Final 12/23/2017 03:27 pm) ---------------------------------------------------------------------- Patient Info  ID #:       [id number]                          D.O.B.:  02-15-1981 (36 yrs)  Name:       [facility]                Visit Date: 12/23/2017 02:21 pm ---------------------------------------------------------------------- Performed By  Performed By:  Carrie Stalter BS      Ref. Address:     [address]                    [address]                                                             [address]                                                             [place], [place]                                                             [address]  Attending:        Patsi Sears      Location:         [facility]                    MD  Referred By:      Tereso Newcomer MD  ---------------------------------------------------------------------- Orders   #  Description                          Code         Ordered By   1  [place] MFM OB FOLLOW UP                  02725.36     Noralee Space   2  [place] MFM OB TRANSVAGINAL               64403.4      RAVI [facility]  ----------------------------------------------------------------------   #  Order #                    Accession #                 Episode #   1  [id number]                  [id number]                  [id number]   2  [id number]                  [id number]                  [id number]  ---------------------------------------------------------------------- Indications   Poor obstetric history: Previous preterm       O09.219   delivery, antepartum (@ 36wks, placental   abruption; started 17P injections 09/14/17)   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     O99.280, E07.9   goiter)   Medical complication of pregnancy (Heart       O26.90  murmur)   Placenta previa specified as without           O44.03   hemorrhage, third trimester   [redacted] weeks gestation of pregnancy                Z3A.30  ---------------------------------------------------------------------- Vital Signs                                                 Height:        5'3" ---------------------------------------------------------------------- Fetal Evaluation  Num Of Fetuses:         1  Fetal Heart Rate(bpm):  136  Cardiac Activity:       Observed  Presentation:           Cephalic  Placenta:               Posterior  P. Cord Insertion:      Visualized  Amniotic Fluid  AFI FV:      Within normal limits  AFI Sum(cm)     %Tile       Largest Pocket(cm)  16.33           59          6.39  RUQ(cm)       RLQ(cm)       LUQ(cm)        LLQ(cm)  6.39          1.66          4.11           4.17 ---------------------------------------------------------------------- Biometry  BPD:      70.9  mm     G. Age:  28w 3d          1  %    CI:        68.89   %    70 - 86                                                           FL/HC:      22.2   %    19.3 - 21.3  HC:      272.9  mm     G. Age:  29w 6d          3  %    HC/AC:      1.04        0.96 - 1.17  AC:      262.3  mm     G. Age:  30w 2d         32  %    FL/BPD:     85.5   %    71 - 87  FL:       60.6  mm     G. Age:  31w 4d         54  %    FL/AC:      23.1   %    20 - 24  HUM:        52  mm     G. Age:  30w 2d         42  %  CER:      39.4  mm     G. Age:  33w 3d         90  %  Est. FW:    1588  gm      3 lb 8 oz     48  % ---------------------------------------------------------------------- OB History  Gravidity:    3         Prem:   1         SAB:   1  Living:       1 ---------------------------------------------------------------------- Gestational Age  LMP:           30w 6d        Date:  05/21/17                 EDD:   02/25/18  U/S Today:     30w 0d                                        EDD:   03/03/18  Best:          30w 6d     Det. By:  LMP  (05/21/17)          EDD:   02/25/18 ---------------------------------------------------------------------- Anatomy  Cranium:               Appears normal         Aortic Arch:            Appears normal  Cavum:                 Appears normal         Ductal Arch:            Previously seen  Ventricles:            Appears normal         Diaphragm:              Appears normal  Choroid Plexus:        Appears normal         Stomach:                Appears normal, left                                                                        sided  Cerebellum:            Appears normal         Abdomen:                Appears normal  Posterior Fossa:       Appears normal         Abdominal Wall:         Appears nml (cord  insert, abd wall)  Nuchal Fold:           Previously seen        Cord Vessels:           Appears normal (3                                                                        vessel cord)  Face:                  Appears normal          Kidneys:                Appear normal                         (orbits and profile)  Lips:                  Appears normal         Bladder:                Appears normal  Thoracic:              Appears normal         Spine:                  Previously seen  Heart:                 Appears normal         Upper Extremities:      Previously seen                         (4CH, axis, and                         situs)  RVOT:                  Appears normal         Lower Extremities:      Previously seen  LVOT:                  Appears normal  Other:  Heels previously visualized. Open hands previously visualized. Nasal          bone visualized. Technically difficult due to fetal position. ---------------------------------------------------------------------- Cervix Uterus Adnexa  Cervix  Length:            3.8  cm.  Normal appearance by transvaginal scan  Uterus  No abnormality visualized.  Left Ovary  Within normal limits.  Right Ovary  Not visualized.  Cul De Sac  No free fluid seen.  Adnexa  No abnormality visualized. ---------------------------------------------------------------------- Comments  U/S images reviewed. Findings reviewed with patient.  Appropriate fetal growth is noted.  No fetal abnormalities are  seen.  Placenta previa has resolved  Questions answered.  10 minutes spent face to face.  Recommendations: 1) Follow-up growth in 6 weeks 2)  Weekly BPP beginning @ 36 weeks ---------------------------------------------------------------------- Recommendations  1) Follow-up growth in 6 weeks 2) Weekly BPP beginning @  36 weeks ----------------------------------------------------------------------  Patsi Sears, MD Electronically Signed Final Report   12/23/2017 03:27 pm ----------------------------------------------------------------------  [place] Mfm Ob Follow Up  Result Date: 12/23/2017 ----------------------------------------------------------------------  OBSTETRICS REPORT                        (Signed Final 12/23/2017 03:27 pm) ---------------------------------------------------------------------- Patient Info  ID #:       [id number]                          D.O.B.:  06-Jun-1980 (36 yrs)  Name:       [facility]                Visit Date: 12/23/2017 02:21 pm ---------------------------------------------------------------------- Performed By  Performed By:     Eden Lathe BS      Ref. Address:     [address]                    [address]                                                             [address]                                                             [place], [place]                                                             [address]  Attending:        Patsi Sears      Location:         [facility]                    MD  Referred By:      Tereso Newcomer MD ---------------------------------------------------------------------- Orders   #  Description                          Code         Ordered By   1  [place] MFM OB FOLLOW UP                  19147.82     Noralee Space   2  [place] MFM OB TRANSVAGINAL               95621.3      [facility]  ----------------------------------------------------------------------   #  Order #                    Accession #                 Episode #   1  [id number]  [id number]                  [id number]   2  [id number]                  [id number]                  [id number]  ---------------------------------------------------------------------- Indications   Poor obstetric history: Previous preterm       O09.219   delivery, antepartum (@ 36wks, placental   abruption; started 17P injections 09/14/17)   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     O99.280, E07.9   goiter)   Medical complication of pregnancy (Heart       O26.90   murmur)   Placenta previa specified as without           O44.03   hemorrhage, third trimester   [redacted] weeks gestation of pregnancy                Z3A.30   ---------------------------------------------------------------------- Vital Signs                                                 Height:        5'3" ---------------------------------------------------------------------- Fetal Evaluation  Num Of Fetuses:         1  Fetal Heart Rate(bpm):  136  Cardiac Activity:       Observed  Presentation:           Cephalic  Placenta:               Posterior  P. Cord Insertion:      Visualized  Amniotic Fluid  AFI FV:      Within normal limits  AFI Sum(cm)     %Tile       Largest Pocket(cm)  16.33           59          6.39  RUQ(cm)       RLQ(cm)       LUQ(cm)        LLQ(cm)  6.39          1.66          4.11           4.17 ---------------------------------------------------------------------- Biometry  BPD:      70.9  mm     G. Age:  28w 3d          1  %    CI:        68.89   %    70 - 86                                                          FL/HC:      22.2   %    19.3 - 21.3  HC:      272.9  mm     G. Age:  29w 6d          3  %    HC/AC:      1.04        0.96 - 1.17  AC:      262.3  mm     G. Age:  30w 2d         32  %    FL/BPD:     85.5   %    71 - 87  FL:       60.6  mm     G. Age:  31w 4d         54  %    FL/AC:      23.1   %    20 - 24  HUM:        52  mm     G. Age:  30w 2d         42  %  CER:      39.4  mm     G. Age:  33w 3d         90  %  Est. FW:    1588  gm      3 lb 8 oz     48  % ---------------------------------------------------------------------- OB History  Gravidity:    3         Prem:   1         SAB:   1  Living:       1 ---------------------------------------------------------------------- Gestational Age  LMP:           30w 6d        Date:  05/21/17                 EDD:   02/25/18  U/S Today:     30w 0d                                        EDD:   03/03/18  Best:          30w 6d     Det. By:  LMP  (05/21/17)          EDD:   02/25/18 ---------------------------------------------------------------------- Anatomy  Cranium:               Appears normal          Aortic Arch:            Appears normal  Cavum:                 Appears normal         Ductal Arch:            Previously seen  Ventricles:            Appears normal         Diaphragm:              Appears normal  Choroid Plexus:        Appears normal         Stomach:                Appears normal, left                                                                        sided  Cerebellum:  Appears normal         Abdomen:                Appears normal  Posterior Fossa:       Appears normal         Abdominal Wall:         Appears nml (cord                                                                        insert, abd wall)  Nuchal Fold:           Previously seen        Cord Vessels:           Appears normal (3                                                                        vessel cord)  Face:                  Appears normal         Kidneys:                Appear normal                         (orbits and profile)  Lips:                  Appears normal         Bladder:                Appears normal  Thoracic:              Appears normal         Spine:                  Previously seen  Heart:                 Appears normal         Upper Extremities:      Previously seen                         (4CH, axis, and                         situs)  RVOT:                  Appears normal         Lower Extremities:      Previously seen  LVOT:                  Appears normal  Other:  Heels previously visualized. Open hands previously visualized. Nasal          bone visualized. Technically difficult due to fetal position. ---------------------------------------------------------------------- Cervix Uterus Adnexa  Cervix  Length:  3.8  cm.  Normal appearance by transvaginal scan  Uterus  No abnormality visualized.  Left Ovary  Within normal limits.  Right Ovary  Not visualized.  Cul De Sac  No free fluid seen.  Adnexa  No abnormality visualized.  ---------------------------------------------------------------------- Comments  U/S images reviewed. Findings reviewed with patient.  Appropriate fetal growth is noted.  No fetal abnormalities are  seen.  Placenta previa has resolved  Questions answered.  10 minutes spent face to face.  Recommendations: 1) Follow-up growth in 6 weeks 2)  Weekly BPP beginning @ 36 weeks ---------------------------------------------------------------------- Recommendations  1) Follow-up growth in 6 weeks 2) Weekly BPP beginning @  36 weeks ----------------------------------------------------------------------               Patsi Sears, MD Electronically Signed Final Report   12/23/2017 03:27 pm ----------------------------------------------------------------------  [place] Mfm Ob Limited  Result Date: 01/05/2018 ----------------------------------------------------------------------  OBSTETRICS REPORT                       (Signed Final 01/05/2018 05:54 pm) ---------------------------------------------------------------------- Patient Info  ID #:       [id number]                          D.O.B.:  Oct 01, 1980 (36 yrs)  Name:       [facility]                Visit Date: 01/05/2018 05:48 pm ---------------------------------------------------------------------- Performed By  Performed By:     Eden Lathe BS      Ref. Address:     [address]                                                             [address]                                                             [place], [place]                                                             [address]  Attending:        Noralee Space MD        Location:         [facility]  Referred By:      Tereso Newcomer MD ---------------------------------------------------------------------- Orders   #  Description                          Code         Ordered By   1  [place] MFM OB  LIMITED                    96045.40     [facility]   2  [place] MFM OB TRANSVAGINAL               98119.1      [facility]  ----------------------------------------------------------------------   #  Order #                    Accession #                 Episode #   1  [id number]                  [id number]   2  [id number]                  [id number]  ---------------------------------------------------------------------- Indications   Vaginal bleeding in pregnancy, third trimester O46.93   Poor obstetric history: Previous preterm       O09.219   delivery, antepartum (@ 36wks, placental   abruption; started 17P injections 09/14/17)   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     O99.280, E07.9   goiter)   Medical complication of pregnancy (Heart       O26.90   murmur)   Placenta previa specified as without           O44.03   hemorrhage, third trimester - resolved   [redacted] weeks gestation of pregnancy                Z3A.32  ---------------------------------------------------------------------- Vital Signs                                                 Height:        5'3" ---------------------------------------------------------------------- Fetal Evaluation  Num Of Fetuses:         1  Fetal Heart Rate(bpm):  128  Cardiac Activity:       Observed  Presentation:           Cephalic  Placenta:               Posterior  P. Cord Insertion:      Previously Visualized  Amniotic Fluid  AFI FV:      Within normal limits  AFI Sum(cm)     %Tile       Largest Pocket(cm)  15.2            54  6.18  RUQ(cm)       RLQ(cm)       LUQ(cm)        LLQ(cm)  4.2           2.36          2.46           6.18  Comment:    No placental abruption or previa identified. ---------------------------------------------------------------------- OB History  Gravidity:    3         Prem:   1         SAB:   1  Living:       1  ---------------------------------------------------------------------- Gestational Age  LMP:           32w 5d        Date:  05/21/17                 EDD:   02/25/18  Best:          [name] 5d     Det. By:  LMP  (05/21/17)          EDD:   02/25/18 ---------------------------------------------------------------------- Cervix Uterus Adnexa  Cervix  Length:            2.8  cm.  Measured transvaginally.  Uterus  No abnormality visualized.  Cul De Sac  No free fluid seen.  Adnexa  No abnormality visualized. ---------------------------------------------------------------------- Impression  Patient is being evaluated in the MAU for c/o vaginal  bleeding. She reports that she had passed several blood  clots yesterday and was admitted to [facility] (she  was visiting her relatives in [place]). No vaginal  examination was performed at the hospital. She was  discharged later.  She reports persistent slight vaginal bleeding today. No  abdominal pain or uterine contractions.  She had sexual intercourse 2 weeks ago.  A limited ultrasound was performed. Amniotic fluid is normal  and good fetal activity is seen. Placenta is posterior and the  entire placenta could not be evaluated because of overlying  fetus. No obvious placental abruption is seen. The cervix  measures 2.8 cm, which is normal. There is no placenta  previa.  Vaginal probe cover was not blood stained.  I reassured the patient of the findings and also informed her  that placental abruption cannot be ruled out on ultrasound.  Patient was transferred back to the MAU for further  management. ---------------------------------------------------------------------- Recommendations  Follow-up scans as clinically indicated. ----------------------------------------------------------------------                  Noralee Space, MD Electronically Signed Final Report   01/05/2018 05:54 pm ----------------------------------------------------------------------     Assessment  and Plan: Principal Problem:   Third trimester bleeding, antepartum Active Problems:   History of placental abruption   Gestational diabetes   GBS bacteriuria   Preterm labor in third trimester  Admit to L&D Keep NPO for now.  CBGs q6h while NPO.  Will check labs, T&C for 3 units. Category I FHR tracing now; continuous monitoring and tocometry ordered.   Neonatology consulted, talked to Dr. Cleatis Polka Patient already received BMZ on 9/19-9/20, not a candidate for magnesium sulfate for neuroprotection. Patient was counseled about need for close observation, may need emergent/urgent cesarean section for worsening bleeding or deterioration of maternal/fetal status. If patient is stable later today, may consider transfer to Antenatal Unit. Patient was counseled that she will remain inpatient until she has seven days without bleeding prior to considering possible discharge  to home. Will recheck presentation if she does progress in preterm labor to determine route of delivery; tocolytics are contraindicated for now given presumed abruption. Routine unit care   Jaynie Collins, MD, FACOG Obstetrician & Gynecologist Faculty Practice, [facility]         Electronically signed by Tereso Newcomer, MD at 01/08/2018 3:39 AM     Admission (Discharged) on 01/08/2018       Revision & Routing History       Detailed Report      Hospital Course: *Pregnancy: routine care. Continue with tid NSTs 9/24: negative b/l LE dopplers for diffuse b/l edema and pain; physical exam was negative.  *VB in pregnancy: last spotting on 9/22-23. Pt strongly desired d/c to home. I d/w I'd like to keep her a few more days but patient states she needs to be discharged. *Anemia: s/p ferraheme on 9/24. Rpt one week *h/o PTB:  Pt able to be discharged so can get to clinic for 17p on friday *Preterm: BMZ on 9/19-20. S/p NICU consult already *?gHTN: negative s/s and pre-eclampsia labs *GDMa1: no  issues *PPx: SCDs, OOB ad lib   Discharge Exam:   Patient Vitals for the past 24 hrs:  BP Temp Temp src Pulse Resp SpO2  01/11/18 0906 110/62 98.2 F (36.8 C) Oral 81 18 100 %  01/11/18 0418 (!) 150/67 97.8 F (36.6 C) Oral 74 17 98 %  01/10/18 2342 (!) 143/74 97.7 F (36.5 C) Oral 78 16 100 %  01/10/18 1948 136/74 98.6 F (37 C) Oral 70 16 100 %  01/10/18 1556 130/65 97.7 F (36.5 C) Oral 80 - 100 %  01/10/18 1137 131/70 98 F (36.7 C) Oral 81 18 100 %    General appearance: Well nourished, well developed female in no acute distress.  Neck:  Supple, normal appearance, and no thyromegaly  Cardiovascular: S1, S2 normal, no murmur, rub or gallop, regular rate and rhythm Respiratory:  Clear to auscultation bilateral. Normal respiratory effort Abdomen: gravid, nttp Neuro/Psych:  Normal mood and affect.  Skin:  Warm and dry.  Lymphatic:  No inguinal lymphadenopathy.   Discharge Disposition:  Home  Patient Instructions:  Standard   Results Pending at Discharge:  none  Discharge Medications: Allergies as of 01/13/2018   No Known Allergies     Medication List    STOP taking these medications   aspirin EC 81 MG tablet     TAKE these medications   ACCU-CHEK FASTCLIX LANCETS Misc Check blood sugars up to  4 times a day  024.410   ACCU-CHEK FASTCLIX LANCETS Misc CHECK BLOOD SUGARS UP TO 4 TIMES A DAY   diphenhydrAMINE 25 MG tablet Commonly known as:  BENADRYL Take 25 mg by mouth at bedtime as needed for allergies or sleep.   Doxylamine-Pyridoxine ER 20-20 MG Tbcr Take 1 tablet by mouth at bedtime. May add 1 tab midday if needed   famotidine 20 MG tablet Commonly known as:  PEPCID Take 1 tablet (20 mg total) by mouth 2 (two) times daily.   fluticasone 50 MCG/ACT nasal spray Commonly known as:  FLONASE Place 1 spray into both nostrils daily as needed for allergies or rhinitis.   glucose blood test strip Check sugar 4 times a day 250.00   nitrofurantoin  (macrocrystal-monohydrate) 100 MG capsule Commonly known as:  MACROBID Take 1 capsule (100 mg total) by mouth 2 (two) times daily.   PREPLUS 27-1 MG Tabs Take 1 tablet by  mouth daily.   promethazine 25 MG tablet Commonly known as:  PHENERGAN Take 1 tablet (25 mg total) by mouth every 6 (six) hours as needed for nausea or vomiting.        Future Appointments  Date Time Provider Department Center  01/11/2018  2:45 PM Federico Flake, MD [facility]  02/03/2018  2:30 PM [facility] [place] 5 [facility]    Cornelia Copa. MD Attending Center for [facility])

## 2018-01-12 LAB — TYPE AND SCREEN
ABO/RH(D): A POS
Antibody Screen: NEGATIVE
UNIT DIVISION: 0
UNIT DIVISION: 0
Unit division: 0

## 2018-01-12 LAB — BPAM RBC
BLOOD PRODUCT EXPIRATION DATE: 201910142359
Blood Product Expiration Date: 201910172359
Blood Product Expiration Date: 201910222359
UNIT TYPE AND RH: 6200
Unit Type and Rh: 6200
Unit Type and Rh: 6200

## 2018-01-12 LAB — RAPID URINE DRUG SCREEN, HOSP PERFORMED
AMPHETAMINES: NOT DETECTED
BARBITURATES: NOT DETECTED
BENZODIAZEPINES: NOT DETECTED
Cocaine: NOT DETECTED
Opiates: NOT DETECTED
TETRAHYDROCANNABINOL: NOT DETECTED

## 2018-01-12 LAB — GLUCOSE, CAPILLARY
GLUCOSE-CAPILLARY: 82 mg/dL (ref 70–99)
GLUCOSE-CAPILLARY: 122 mg/dL — AB (ref 70–99)
Glucose-Capillary: 78 mg/dL (ref 70–99)

## 2018-01-12 MED ORDER — PROMETHAZINE HCL 25 MG PO TABS
25.0000 mg | ORAL_TABLET | Freq: Four times a day (QID) | ORAL | Status: DC | PRN
  Administered 2018-01-12 (×2): 25 mg via ORAL
  Filled 2018-01-12 (×2): qty 1

## 2018-01-12 NOTE — Progress Notes (Signed)
Daily Antepartum Note  Admission Date: 01/08/2018 Current Date: 01/12/2018 10:58 AM  [name] Moreno is a 37 y.o. [id number] @ [redacted]w[redacted]d, HD#5, admitted for VB.  Pregnancy complicated by: Patient Active Problem List   Diagnosis Date Noted  . Anemia affecting pregnancy in third trimester 01/10/2018  . Preterm labor in third trimester 01/08/2018  . GBS bacteriuria 01/02/2018  . Gestational diabetes 12/03/2017  . Benign cyst of right breast 10/13/2017  . Supervision of high-risk pregnancy 08/15/2017  . Third trimester bleeding, antepartum 08/15/2017  . Previous preterm delivery at 36 weeks, antepartum 08/15/2017  . History of placental abruption 08/15/2017  . Depression   . Multinodular goiter 01/04/2012    Overnight/24hr events:  None  Subjective:  No PTL or decreased FM s/s.   Last bleed: 2/23, spotting  Objective:    Current Vital Signs 24h Vital Sign Ranges  T 98.2 F (36.8 C) Temp  Avg: 98.2 F (36.8 C)  Min: 97.7 F (36.5 C)  Max: 98.6 F (37 C)  BP 115/68 BP  Min: 103/48  Max: 130/68  HR 83 Pulse  Avg: 79.7  Min: 71  Max: 91  RR 18 Resp  Avg: 17.7  Min: 16  Max: 18  SaO2 98 % Room Air SpO2  Avg: 98.8 %  Min: 98 %  Max: 100 %       24 Hour I/O Current Shift I/O  Time Ins Outs 09/25 0701 - 09/26 0700 In: -  Out: 1  No intake/output data recorded.   Patient Vitals for the past 24 hrs:  BP Temp Temp src Pulse Resp SpO2  01/12/18 1005 115/68 - - 83 18 98 %  01/12/18 0536 123/60 98.2 F (36.8 C) Oral 91 18 98 %  01/11/18 2316 130/68 98.3 F (36.8 C) Oral 72 16 99 %  01/11/18 1939 122/61 98.2 F (36.8 C) Oral 73 18 99 %  01/11/18 1602 119/65 98.6 F (37 C) - 88 18 99 %  01/11/18 1209 (!) 103/48 97.7 F (36.5 C) Oral 71 18 100 %  AM EFM 135 baseline, +accels, no decel, mod variability Toco quiet PM EFM: 145 baseline, +accels, no decel, mod variability Toco: quiet EFM: 135 baseline, +accels, no decel, mod variability Toco: quiet  Physical exam: General:  Well nourished, well developed female in no acute distress. Abdomen: gravid nttp Cardiovascular: S1, S2 normal, no murmur, rub or gallop, regular rate and rhythm Respiratory: CTAB Extremities: no clubbing, cyanosis or edema Skin: Warm and dry.   Medications: Current Facility-Administered Medications  Medication Dose Route Frequency Provider Last Rate Last Dose  . 0.9 %  sodium chloride infusion (Manually program via Guardrails IV Fluids)   Intravenous Once Reva Bores, MD      . acetaminophen (TYLENOL) tablet 650 mg  650 mg Oral Q4H PRN Reva Bores, MD   650 mg at 01/11/18 1823  . bisacodyl (DULCOLAX) EC tablet 5 mg  5 mg Oral Once Benns Church Bing, MD   Stopped at 01/11/18 1455  . calcium carbonate (TUMS - dosed in mg elemental calcium) chewable tablet 400 mg of elemental calcium  2 tablet Oral Q4H PRN Reva Bores, MD   400 mg of elemental calcium at 01/12/18 0837  . famotidine (PEPCID) tablet 20 mg  20 mg Oral BID Reva Bores, MD   20 mg at 01/12/18 1018  . ferumoxytol (FERAHEME) 510 mg in sodium chloride 0.9 % 100 mL IVPB  510 mg Intravenous Weekly Ismay Bing, MD 468 mL/hr  at 01/10/18 1135 510 mg at 01/10/18 1135  . fluticasone (FLONASE) 50 MCG/ACT nasal spray 1 spray  1 spray Each Nare Daily PRN Reva Bores, MD   1 spray at 01/09/18 1013  . polyethylene glycol (MIRALAX / GLYCOLAX) packet 17 g  17 g Oral BID Helen Bing, MD   17 g at 01/12/18 1018  . prenatal vitamin w/FE, FA (NATACHEW) chewable tablet 1 tablet  1 tablet Oral Q1200 Lilly Bing, MD   1 tablet at 01/11/18 2211  . zolpidem (AMBIEN) tablet 5 mg  5 mg Oral QHS PRN Levie Heritage, DO   5 mg at 01/11/18 2317    Labs:  Recent Labs  Lab 01/09/18 0825 01/10/18 0709 01/11/18 1017  WBC 8.7 8.5 8.3  HGB 8.9* 9.0* 9.5*  HCT 26.7* 27.2* 28.0*  PLT 161 166 169   Results for PEGGY, LOGE (MRN [id number]) as of 01/12/2018 11:12  Ref. Range 01/11/2018 13:57 01/11/2018 16:05 01/11/2018 21:45  01/12/2018 06:29 01/12/2018 11:05  Glucose-Capillary Latest Ref Range: 70 - 99 mg/dL 409 (H) 811 (H) 914 (H) 78 82    Radiology: no new imaging 9/23: ceph, normal AFI, normal placenta 9/6: efw 48%, 1588gm, AC 32%, normal AF  Assessment & Plan:  Pt stable *Pregnancy: routine care. Continue with tid NSTs *VB in pregnancy *Anemia: s/p ferraheme on 9/24. Rpt one week *h/o PTB: 17p on friday *Preterm: BMZ on 9/19-20. S/p NICU consult already *GDMa1: no issues *PPx: SCDs, OOB ad lib *FEN/GI: SLIV, GDM diet *Dispo: potentially tomorrow  Cornelia Copa. MD Attending Center for [facility])

## 2018-01-13 ENCOUNTER — Telehealth: Payer: Self-pay

## 2018-01-13 ENCOUNTER — Ambulatory Visit: Payer: Medicaid Other

## 2018-01-13 LAB — GLUCOSE, CAPILLARY
GLUCOSE-CAPILLARY: 87 mg/dL (ref 70–99)
GLUCOSE-CAPILLARY: 128 mg/dL — AB (ref 70–99)

## 2018-01-13 MED ORDER — PROMETHAZINE HCL 25 MG PO TABS: 25.0000 mg | ORAL_TABLET | Freq: Four times a day (QID) | ORAL | 0 refills | Status: DC | PRN

## 2018-01-13 NOTE — Telephone Encounter (Signed)
Patient was put on the scheduled today for her 17- p injection. She called at 11:15 and stated she was leaving the [facility] and coming to our office to get her injection 17-P. Called patient at 12:36pm to leave a message no answer voice mail did not come on.

## 2018-01-13 NOTE — Progress Notes (Signed)
Discharge instructions and prescriptions given to pt.  Discussed GDM management, signs and symptoms to report to the MD, upcoming appointments, and meds. Pt verbalizes understanding and has no questions or concerns at this time. IV taken out and pt tolerated well. Pt discharged in stable condition.

## 2018-01-13 NOTE — Discharge Instructions (Signed)
Vaginal Bleeding During Pregnancy, Third Trimester °A small amount of bleeding (spotting) from the vagina is relatively common in pregnancy. Various things can cause bleeding or spotting in pregnancy. Sometimes the bleeding is normal and is not a problem. However, bleeding during the third trimester can also be a sign of something serious for the mother and the baby. Be sure to tell your health care provider about any vaginal bleeding right away. °Some possible causes of vaginal bleeding during the third trimester include: °· The placenta may be partially or completely covering the opening to the cervix (placenta previa). °· The placenta may have separated from the uterus (abruption of the placenta). °· There may be an infection or growth on the cervix. °· You may be starting labor, called discharging of the mucus plug. °· The placenta may grow into the muscle layer of the uterus (placenta accreta). ° °Follow these instructions at home: °Watch your condition for any changes. The following actions may help to lessen any discomfort you are feeling: °· Follow your health care provider's instructions for limiting your activity. If your health care provider orders bed rest, you may need to stay in bed and only get up to use the bathroom. However, your health care provider may allow you to continue light activity. °· If needed, make plans for someone to help with your regular activities and responsibilities while you are on bed rest. °· Keep track of the number of pads you use each day, how often you change pads, and how soaked (saturated) they are. Write this down. °· Do not use tampons. Do not douche. °· Do not have sexual intercourse or orgasms until approved by your health care provider. °· Follow your health care provider's advice about lifting, driving, and physical activities. °· If you pass any tissue from your vagina, save the tissue so you can show it to your health care provider. °· Only take over-the-counter  or prescription medicines as directed by your health care provider. °· Do not take aspirin because it can make you bleed. °· Keep all follow-up appointments as directed by your health care provider. ° °Contact a health care provider if: °· You have any vaginal bleeding during any part of your pregnancy. °· You have cramps or labor pains. °· You have a fever, not controlled by medicine. °Get help right away if: °· You have severe cramps or pain in your back or belly (abdomen). °· You have chills. °· You have a gush of fluid from the vagina. °· You pass large clots or tissue from your vagina. °· Your bleeding increases. °· You feel light-headed or weak. °· You pass out. °· You feel less movement or no movement of the baby. °This information is not intended to replace advice given to you by your health care provider. Make sure you discuss any questions you have with your health care provider. °Document Released: 06/26/2002 Document Revised: 09/11/2015 Document Reviewed: 12/11/2012 °Elsevier Interactive Patient Education © 2018 [employer] ° °

## 2018-01-16 ENCOUNTER — Other Ambulatory Visit: Payer: Self-pay | Admitting: Obstetrics and Gynecology

## 2018-01-16 ENCOUNTER — Telehealth: Payer: Self-pay | Admitting: *Deleted

## 2018-01-16 ENCOUNTER — Telehealth: Payer: Self-pay

## 2018-01-16 ENCOUNTER — Ambulatory Visit: Payer: Medicaid Other

## 2018-01-16 NOTE — Telephone Encounter (Signed)
Made patient appointment for her ferriheme infusion for Wednesday Oct 2nd ant 12pm, tried to call patient to inform her of appointment, no answer and was unable to leave message.   Scheryl Marten, RN

## 2018-01-16 NOTE — Telephone Encounter (Signed)
Patient no show appointment for today for 17-p.

## 2018-01-17 ENCOUNTER — Telehealth: Payer: Self-pay

## 2018-01-17 ENCOUNTER — Ambulatory Visit: Payer: Medicaid Other

## 2018-01-17 VITALS — BP 120/72 | HR 72

## 2018-01-17 DIAGNOSIS — O0992 Supervision of high risk pregnancy, unspecified, second trimester: Secondary | ICD-10-CM

## 2018-01-17 MED ORDER — HYDROXYPROGESTERONE CAPROATE 275 MG/1.1ML ~~LOC~~ SOAJ
275.0000 mg | Freq: Once | SUBCUTANEOUS | Status: AC
  Administered 2018-01-17: 275 mg via SUBCUTANEOUS

## 2018-01-17 NOTE — Progress Notes (Signed)
Patient presented to the office today for her 17-P injection received in left arm sq. NDC # [id number]. Patient will follow up next week for next injection.

## 2018-01-17 NOTE — Telephone Encounter (Signed)
Patient has been set up for iron infusion with [facility] [address]. Called patient to inform her of recommendation/lab results. Voice mail has not been set up.

## 2018-01-18 ENCOUNTER — Encounter (HOSPITAL_COMMUNITY): Payer: Self-pay

## 2018-01-18 ENCOUNTER — Encounter (HOSPITAL_COMMUNITY): Payer: Medicaid Other

## 2018-01-18 ENCOUNTER — Inpatient Hospital Stay (HOSPITAL_BASED_OUTPATIENT_CLINIC_OR_DEPARTMENT_OTHER): Payer: Medicaid Other

## 2018-01-18 ENCOUNTER — Inpatient Hospital Stay (HOSPITAL_COMMUNITY)
Admission: AD | Admit: 2018-01-18 | Discharge: 2018-01-27 | DRG: 787 | Disposition: A | Discharge status: Home / Self Care | Payer: Medicaid Other | Attending: Obstetrics and Gynecology | Admitting: Obstetrics and Gynecology

## 2018-01-18 ENCOUNTER — Ambulatory Visit (HOSPITAL_COMMUNITY): Admission: RE | Admit: 2018-01-18 | Payer: Medicaid Other | Source: Ambulatory Visit

## 2018-01-18 ENCOUNTER — Inpatient Hospital Stay (HOSPITAL_COMMUNITY): Payer: Medicaid Other

## 2018-01-18 ENCOUNTER — Other Ambulatory Visit: Payer: Self-pay

## 2018-01-18 DIAGNOSIS — O42113 Preterm premature rupture of membranes, onset of labor more than 24 hours following rupture, third trimester: Secondary | ICD-10-CM | POA: Diagnosis not present

## 2018-01-18 DIAGNOSIS — O4693 Antepartum hemorrhage, unspecified, third trimester: Secondary | ICD-10-CM | POA: Diagnosis present

## 2018-01-18 DIAGNOSIS — O4403 Placenta previa specified as without hemorrhage, third trimester: Secondary | ICD-10-CM | POA: Diagnosis not present

## 2018-01-18 DIAGNOSIS — O99283 Endocrine, nutritional and metabolic diseases complicating pregnancy, third trimester: Secondary | ICD-10-CM

## 2018-01-18 DIAGNOSIS — Z3A34 34 weeks gestation of pregnancy: Secondary | ICD-10-CM

## 2018-01-18 DIAGNOSIS — O99012 Anemia complicating pregnancy, second trimester: Secondary | ICD-10-CM | POA: Diagnosis present

## 2018-01-18 DIAGNOSIS — O42919 Preterm premature rupture of membranes, unspecified as to length of time between rupture and onset of labor, unspecified trimester: Secondary | ICD-10-CM

## 2018-01-18 DIAGNOSIS — O4593 Premature separation of placenta, unspecified, third trimester: Secondary | ICD-10-CM | POA: Diagnosis not present

## 2018-01-18 DIAGNOSIS — E079 Disorder of thyroid, unspecified: Secondary | ICD-10-CM | POA: Diagnosis not present

## 2018-01-18 DIAGNOSIS — O9902 Anemia complicating childbirth: Secondary | ICD-10-CM | POA: Diagnosis present

## 2018-01-18 DIAGNOSIS — O99824 Streptococcus B carrier state complicating childbirth: Secondary | ICD-10-CM | POA: Diagnosis present

## 2018-01-18 DIAGNOSIS — Z3A35 35 weeks gestation of pregnancy: Secondary | ICD-10-CM | POA: Diagnosis not present

## 2018-01-18 DIAGNOSIS — Z98891 History of uterine scar from previous surgery: Secondary | ICD-10-CM

## 2018-01-18 DIAGNOSIS — O0991 Supervision of high risk pregnancy, unspecified, first trimester: Secondary | ICD-10-CM

## 2018-01-18 DIAGNOSIS — Z87891 Personal history of nicotine dependence: Secondary | ICD-10-CM | POA: Diagnosis not present

## 2018-01-18 DIAGNOSIS — O99013 Anemia complicating pregnancy, third trimester: Secondary | ICD-10-CM | POA: Diagnosis present

## 2018-01-18 DIAGNOSIS — O24420 Gestational diabetes mellitus in childbirth, diet controlled: Secondary | ICD-10-CM | POA: Diagnosis not present

## 2018-01-18 DIAGNOSIS — O09219 Supervision of pregnancy with history of pre-term labor, unspecified trimester: Secondary | ICD-10-CM

## 2018-01-18 DIAGNOSIS — O99019 Anemia complicating pregnancy, unspecified trimester: Secondary | ICD-10-CM | POA: Diagnosis present

## 2018-01-18 DIAGNOSIS — O42913 Preterm premature rupture of membranes, unspecified as to length of time between rupture and onset of labor, third trimester: Secondary | ICD-10-CM | POA: Diagnosis not present

## 2018-01-18 DIAGNOSIS — O0990 Supervision of high risk pregnancy, unspecified, unspecified trimester: Secondary | ICD-10-CM

## 2018-01-18 DIAGNOSIS — O09213 Supervision of pregnancy with history of pre-term labor, third trimester: Secondary | ICD-10-CM | POA: Diagnosis not present

## 2018-01-18 DIAGNOSIS — D649 Anemia, unspecified: Secondary | ICD-10-CM | POA: Diagnosis not present

## 2018-01-18 DIAGNOSIS — Z8632 Personal history of gestational diabetes: Secondary | ICD-10-CM | POA: Diagnosis present

## 2018-01-18 DIAGNOSIS — R8271 Bacteriuria: Secondary | ICD-10-CM | POA: Diagnosis present

## 2018-01-18 LAB — CBC
HCT: 29 % — ABNORMAL LOW (ref 36.0–46.0)
HEMOGLOBIN: 9.5 g/dL — AB (ref 12.0–15.0)
MCH: 24.3 pg — AB (ref 26.0–34.0)
MCHC: 32.8 g/dL (ref 30.0–36.0)
MCV: 74.2 fL — AB (ref 78.0–100.0)
Platelets: 180 10*3/uL (ref 150–400)
RBC: 3.91 MIL/uL (ref 3.87–5.11)
RDW: 15.3 % (ref 11.5–15.5)
WBC: 9.6 10*3/uL (ref 4.0–10.5)

## 2018-01-18 LAB — TYPE AND SCREEN
ABO/RH(D): A POS
Antibody Screen: NEGATIVE

## 2018-01-18 LAB — GLUCOSE, CAPILLARY: Glucose-Capillary: 114 mg/dL — ABNORMAL HIGH (ref 70–99)

## 2018-01-18 MED ORDER — FENTANYL CITRATE (PF) 100 MCG/2ML IJ SOLN
50.0000 ug | Freq: Once | INTRAMUSCULAR | Status: AC
  Administered 2018-01-18: 50 ug via INTRAVENOUS
  Filled 2018-01-18: qty 2

## 2018-01-18 MED ORDER — LACTATED RINGERS IV SOLN: INTRAVENOUS | Status: DC

## 2018-01-18 MED ORDER — LACTATED RINGERS IV BOLUS
1000.0000 mL | Freq: Once | INTRAVENOUS | Status: AC
  Administered 2018-01-18: 1000 mL via INTRAVENOUS

## 2018-01-18 MED ORDER — FAMOTIDINE 20 MG PO TABS
20.0000 mg | ORAL_TABLET | Freq: Two times a day (BID) | ORAL | Status: DC
  Administered 2018-01-18 – 2018-01-23 (×11): 20 mg via ORAL
  Filled 2018-01-18 (×11): qty 1

## 2018-01-18 MED ORDER — PRENATAL MULTIVITAMIN CH: 1.0000 | ORAL_TABLET | Freq: Every day | ORAL | Status: DC

## 2018-01-18 MED ORDER — LACTATED RINGERS IV SOLN
INTRAVENOUS | Status: DC
  Administered 2018-01-18 (×2): via INTRAVENOUS

## 2018-01-18 MED ORDER — FLUTICASONE PROPIONATE 50 MCG/ACT NA SUSP
1.0000 | Freq: Every day | NASAL | Status: DC | PRN
  Administered 2018-01-20: 1 via NASAL
  Filled 2018-01-18: qty 16

## 2018-01-18 MED ORDER — PROMETHAZINE HCL 25 MG PO TABS
25.0000 mg | ORAL_TABLET | Freq: Four times a day (QID) | ORAL | Status: DC | PRN
  Administered 2018-01-18 – 2018-01-23 (×4): 25 mg via ORAL
  Filled 2018-01-18 (×4): qty 1

## 2018-01-18 MED ORDER — SODIUM CHLORIDE 0.9 % IV SOLN
510.0000 mg | Freq: Once | INTRAVENOUS | Status: AC
  Administered 2018-01-18: 510 mg via INTRAVENOUS
  Filled 2018-01-18: qty 17

## 2018-01-18 MED ORDER — DOXYLAMINE-PYRIDOXINE ER 20-20 MG PO TBCR: 1.0000 | EXTENDED_RELEASE_TABLET | Freq: Every day | ORAL | Status: DC

## 2018-01-18 MED ORDER — COMPLETENATE 29-1 MG PO CHEW
1.0000 | CHEWABLE_TABLET | Freq: Every day | ORAL | Status: DC
  Administered 2018-01-18 – 2018-01-23 (×6): 1 via ORAL
  Filled 2018-01-18 (×7): qty 1

## 2018-01-18 MED ORDER — ACETAMINOPHEN 325 MG PO TABS
650.0000 mg | ORAL_TABLET | ORAL | Status: DC | PRN
  Administered 2018-01-18 – 2018-01-20 (×2): 650 mg via ORAL
  Filled 2018-01-18 (×2): qty 2

## 2018-01-18 MED ORDER — CALCIUM CARBONATE ANTACID 500 MG PO CHEW: 2.0000 | CHEWABLE_TABLET | ORAL | Status: DC | PRN

## 2018-01-18 MED ORDER — ZOLPIDEM TARTRATE 5 MG PO TABS
5.0000 mg | ORAL_TABLET | Freq: Every evening | ORAL | Status: DC | PRN
  Administered 2018-01-18 – 2018-01-22 (×4): 5 mg via ORAL
  Filled 2018-01-18 (×4): qty 1

## 2018-01-18 MED ORDER — DOCUSATE SODIUM 100 MG PO CAPS
100.0000 mg | ORAL_CAPSULE | Freq: Every day | ORAL | Status: DC
  Administered 2018-01-18 – 2018-01-23 (×4): 100 mg via ORAL
  Filled 2018-01-18 (×5): qty 1

## 2018-01-18 NOTE — MAU Note (Signed)
PT SAYS  SHE STARTED VAG BLEEDING AT 0450. WAS HERE LAST Friday FOR SAME- ADMITTED FOR 7 DAYS. DOESN'T  KNOW WHY BLEEDING.    FEELS PRESSURE.   VE  3-4 CM.

## 2018-01-18 NOTE — H&P (Signed)
HPI: [name] Moreno is a 37 y.o. [phone] at [redacted]w[redacted]d who presents to maternity admissions reporting vaginal bleeding. Was admitted 9/22-9/27 for bleeding. Received BMZ 9/19 & 9/20. States bleeding had stopped while inpatient. Woke up this morning around 5 am with bleeding heavier than previous admission. Also feels vaginal pressure.  Positive fetal movement.   Location: abdomen & vagina Quality: pressure          Severity: 0/10 in pain scale Duration: 1 hour Timing: constant Modifying factors: none Associated signs and symptoms: vaginal bleeding  Pregnancy Course: admitted for presumed abruption. Receiving 17-P d/t hx preterm labor r/t abruption.       Past Medical History:  Diagnosis Date  . Depression   . Headache   . Heart murmur   . Multinodular goiter   . Thyroid nodule                    OB History  Gravida Para Term Preterm AB Living  3 1 0 1 1 1   SAB TAB Ectopic Multiple Live Births     1 0 0 0 1       # Outcome Date GA Lbr Len/2nd Weight Sex Delivery Anes PTL Lv  3 Current           2 SAB 04/19/17     SAB     1 Preterm 05/29/03 [redacted]w[redacted]d    Vag-Spont   LIV     Birth Comments: Had "placental separation" leading to preterm delivery     Complications: Abruptio Placenta   History reviewed. No pertinent surgical history.      Family History  Problem Relation Age of Onset  . Hypertension Other        Grandparents  . Diabetes Other        Grandparents   Social History        Tobacco Use  . Smoking status: Former Smoker    Packs/day: 0.30    Types: Cigarettes    Start date: 07/01/2017  . Smokeless tobacco: Never Used  Substance Use Topics  . Alcohol use: Yes    Comment: occas. when not preg  . Drug use: No   No Known Allergies        Medications Prior to Admission  Medication Sig Dispense Refill Last Dose  . diphenhydrAMINE (BENADRYL) 25 MG tablet Take 25 mg by mouth at bedtime as needed for allergies or  sleep.   01/17/2018 at Unknown time  . famotidine (PEPCID) 20 MG tablet Take 1 tablet (20 mg total) by mouth 2 (two) times daily. 60 tablet 3 01/17/2018 at Unknown time  . fluticasone (FLONASE) 50 MCG/ACT nasal spray Place 1 spray into both nostrils daily as needed for allergies or rhinitis.   01/17/2018 at Unknown time  . Prenatal Vit-Fe Fumarate-FA (PREPLUS) 27-1 MG TABS Take 1 tablet by mouth daily. 30 tablet 13 01/17/2018 at Unknown time  . promethazine (PHENERGAN) 25 MG tablet Take 1 tablet (25 mg total) by mouth every 6 (six) hours as needed for nausea or vomiting. 30 tablet 0 01/17/2018 at Unknown time  . ACCU-CHEK FASTCLIX LANCETS MISC Check blood sugars up to  4 times a day  024.410 100 each 12 Taking  . ACCU-CHEK FASTCLIX LANCETS MISC CHECK BLOOD SUGARS UP TO 4 TIMES A DAY 100 each 12 Taking  . Doxylamine-Pyridoxine ER (BONJESTA) 20-20 MG TBCR Take 1 tablet by mouth at bedtime. May add 1 tab midday if needed 60 tablet 6 Taking  . glucose  blood test strip Check sugar 4 times a day 250.00 100 each 12 Taking  . nitrofurantoin, macrocrystal-monohydrate, (MACROBID) 100 MG capsule Take 1 capsule (100 mg total) by mouth 2 (two) times daily. (Patient not taking: Reported on 01/17/2018) 14 capsule 0 Not Taking    I have reviewed patient's Past Medical Hx, Surgical Hx, Family Hx, Social Hx, medications and allergies.   ROS:  Review of Systems  Constitutional: Negative.   Gastrointestinal: Positive for abdominal pain.  Genitourinary: Positive for vaginal bleeding.    Physical Exam   Patient Vitals for the past 24 hrs:  BP Temp Temp src Pulse Resp Height Weight  01/18/18 0545 118/67 97.9 F (36.6 C) Oral 91 20 5\' 3"  (1.6 m) 88.6 kg    Constitutional: Well-developed, well-nourished female in no acute distress.  Cardiovascular: normal rate & rhythm, no murmur Respiratory: normal effort, lung sounds clear throughout GI: Abd soft, non-tender, gravid appropriate for gestational  age. Pos BS x 4 MS: Extremities nontender, no edema, normal ROM Neurologic: Alert and oriented x 4.  GU:      Pelvic: moderate amount of dark red blood; unable to visualize cervix. Grapefruit sized clot removed from canal.              Dilation: 3.5 Effacement (%): 40 Cervical Position: Middle Exam by:: Judeth Horn NP  NST:  Baseline: 135 bpm, Variability: Good {> 6 bpm), Accelerations: Reactive and Decelerations: Absent   Labs: Lab Results Last 24 Hours  No results found for this or any previous visit (from the past 24 hour(s)).    Imaging:  No results found.  MAU Course:    Orders Placed This Encounter  Procedures  . [place] MFM OB LIMITED  . CBC  . Type and screen      Meds ordered this encounter  Medications  . DISCONTD: lactated ringers infusion  . lactated ringers bolus 1,000 mL    MDM: IV fluids started -- will given LR bolus in MAU CBC & T/S ordered Bedside ultrasound ordered Dr. Adrian Blackwater made aware of patient -- plans for admission  Assessment: 1. Vaginal bleeding in pregnancy, third trimester   2. [redacted] weeks gestation of pregnancy     Plan: Admit to hospital per consult with Dr. Adrian Blackwater BMZ series completed 01/06/18 CEFM Bedside ultrasound pending  Judeth Horn, NP 01/18/2018 6:15 AM

## 2018-01-18 NOTE — MAU Provider Note (Signed)
Chief Complaint:  Vaginal Bleeding   First Provider Initiated Contact with Patient 01/18/18 0556     HPI: [name] Moreno is a 37 y.o. [id number] at [redacted]w[redacted]d who presents to maternity admissions reporting vaginal bleeding. Was admitted 9/22-9/27 for bleeding. Received BMZ 9/19 & 9/20. States bleeding had stopped while inpatient. Woke up this morning around 5 am with bleeding heavier than previous admission. Also feels vaginal pressure.  Positive fetal movement.   Location: abdomen & vagina Quality: pressure  Severity: 0/10 in pain scale Duration: 1 hour Timing: constant Modifying factors: none Associated signs and symptoms: vaginal bleeding  Pregnancy Course: admitted for presumed abruption. Receiving 17-P d/t hx preterm labor r/t abruption.   Past Medical History:  Diagnosis Date  . Depression   . Headache   . Heart murmur   . Multinodular goiter   . Thyroid nodule    OB History  Gravida Para Term Preterm AB Living  3 1 0 1 1 1   SAB TAB Ectopic Multiple Live Births  1 0 0 0 1    # Outcome Date GA Lbr Len/2nd Weight Sex Delivery Anes PTL Lv  3 Current           2 SAB 04/19/17     SAB     1 Preterm 05/29/03 [redacted]w[redacted]d    Vag-Spont   LIV     Birth Comments: Had "placental separation" leading to preterm delivery     Complications: Abruptio Placenta   History reviewed. No pertinent surgical history. Family History  Problem Relation Age of Onset  . Hypertension Other        Grandparents  . Diabetes Other        Grandparents   Social History   Tobacco Use  . Smoking status: Former Smoker    Packs/day: 0.30    Types: Cigarettes    Start date: 07/01/2017  . Smokeless tobacco: Never Used  Substance Use Topics  . Alcohol use: Yes    Comment: occas. when not preg  . Drug use: No   No Known Allergies Medications Prior to Admission  Medication Sig Dispense Refill Last Dose  . diphenhydrAMINE (BENADRYL) 25 MG tablet Take 25 mg by mouth at bedtime as needed for allergies or sleep.    01/17/2018 at Unknown time  . famotidine (PEPCID) 20 MG tablet Take 1 tablet (20 mg total) by mouth 2 (two) times daily. 60 tablet 3 01/17/2018 at Unknown time  . fluticasone (FLONASE) 50 MCG/ACT nasal spray Place 1 spray into both nostrils daily as needed for allergies or rhinitis.   01/17/2018 at Unknown time  . Prenatal Vit-Fe Fumarate-FA (PREPLUS) 27-1 MG TABS Take 1 tablet by mouth daily. 30 tablet 13 01/17/2018 at Unknown time  . promethazine (PHENERGAN) 25 MG tablet Take 1 tablet (25 mg total) by mouth every 6 (six) hours as needed for nausea or vomiting. 30 tablet 0 01/17/2018 at Unknown time  . ACCU-CHEK FASTCLIX LANCETS MISC Check blood sugars up to  4 times a day  024.410 100 each 12 Taking  . ACCU-CHEK FASTCLIX LANCETS MISC CHECK BLOOD SUGARS UP TO 4 TIMES A DAY 100 each 12 Taking  . Doxylamine-Pyridoxine ER (BONJESTA) 20-20 MG TBCR Take 1 tablet by mouth at bedtime. May add 1 tab midday if needed 60 tablet 6 Taking  . glucose blood test strip Check sugar 4 times a day 250.00 100 each 12 Taking  . nitrofurantoin, macrocrystal-monohydrate, (MACROBID) 100 MG capsule Take 1 capsule (100 mg total) by mouth 2 (two)  times daily. (Patient not taking: Reported on 01/17/2018) 14 capsule 0 Not Taking    I have reviewed patient's Past Medical Hx, Surgical Hx, Family Hx, Social Hx, medications and allergies.   ROS:  Review of Systems  Constitutional: Negative.   Gastrointestinal: Positive for abdominal pain.  Genitourinary: Positive for vaginal bleeding.    Physical Exam   Patient Vitals for the past 24 hrs:  BP Temp Temp src Pulse Resp Height Weight  01/18/18 0545 118/67 97.9 F (36.6 C) Oral 91 20 5\' 3"  (1.6 m) 88.6 kg    Constitutional: Well-developed, well-nourished female in no acute distress.  Cardiovascular: normal rate & rhythm, no murmur Respiratory: normal effort, lung sounds clear throughout GI: Abd soft, non-tender, gravid appropriate for gestational age. Pos BS x 4 MS:  Extremities nontender, no edema, normal ROM Neurologic: Alert and oriented x 4.  GU:      Pelvic: moderate amount of dark red blood; unable to visualize cervix. Grapefruit sized clot removed from canal.   Dilation: 3.5 Effacement (%): 40 Cervical Position: Middle Exam by:: Judeth Horn NP  NST:  Baseline: 135 bpm, Variability: Good {> 6 bpm), Accelerations: Reactive and Decelerations: Absent   Labs: No results found for this or any previous visit (from the past 24 hour(s)).  Imaging:  No results found.  MAU Course: Orders Placed This Encounter  Procedures  . [place] MFM OB LIMITED  . CBC  . Type and screen   Meds ordered this encounter  Medications  . DISCONTD: lactated ringers infusion  . lactated ringers bolus 1,000 mL    MDM: IV fluids started -- will given LR bolus in MAU CBC & T/S ordered Bedside ultrasound ordered Dr. Adrian Blackwater made aware of patient -- plans for admission  Assessment: 1. Vaginal bleeding in pregnancy, third trimester   2. [redacted] weeks gestation of pregnancy     Plan: Admit to hospital per consult with Dr. Adrian Blackwater BMZ series completed 01/06/18 CE[facility] Bedside ultrasound pending  Judeth Horn, NP 01/18/2018 6:15 AM

## 2018-01-19 LAB — GLUCOSE, CAPILLARY
GLUCOSE-CAPILLARY: 73 mg/dL (ref 70–99)
GLUCOSE-CAPILLARY: 108 mg/dL — AB (ref 70–99)
Glucose-Capillary: 139 mg/dL — ABNORMAL HIGH (ref 70–99)
Glucose-Capillary: 81 mg/dL (ref 70–99)

## 2018-01-19 LAB — RPR: RPR Ser Ql: NONREACTIVE

## 2018-01-19 MED ORDER — POLYETHYLENE GLYCOL 3350 17 G PO PACK
17.0000 g | PACK | Freq: Every day | ORAL | Status: DC | PRN
  Filled 2018-01-19: qty 1

## 2018-01-19 NOTE — Progress Notes (Signed)
Nurse was called to room as patient complaining of back pain that comes and goes that patient thinks is contractions. Patient placed on monitor.

## 2018-01-19 NOTE — Progress Notes (Signed)
Patient ID: [name] Moreno, female   DOB: 02/19/1981, 37 y.o.   MRN: [id number] FACULTY PRACTICE ANTEPARTUM(COMPREHENSIVE) NOTE  [name] Moreno is a 37 y.o. [id number] at [redacted]w[redacted]d by best clinical estimate who is admitted for third trimester bleeding.   Fetal presentation is cephalic. Length of Stay:  1  Days  Subjective: Still bleeding, no change Reports significant constipation Patient reports the fetal movement as active. Patient reports uterine contraction  activity as none. Patient reports  vaginal bleeding as none. Patient describes fluid per vagina as None.  Vitals:  Blood pressure 120/74, pulse 77, temperature 98.5 F (36.9 C), temperature source Oral, resp. rate 18, height 5\' 3"  (1.6 m), weight 88.6 kg, last menstrual period 05/21/2017, SpO2 100 %. Physical Examination:  General appearance - alert, well appearing, and in no distress Chest - normal effort Abdomen - gravid, non-tender Fundal Height:  size equals dates Extremities: Homans sign is negative, no sign of DVT  Membranes:intact  Fetal Monitoring:  Baseline: 140 bpm, Variability: Good {> 6 bpm), Accelerations: Reactive and Decelerations: Absent  Labs:  Results for orders placed or performed during the hospital encounter of 01/18/18 (from the past 24 hour(s))  Glucose, capillary   Collection Time: 01/18/18  9:59 PM  Result Value Ref Range   Glucose-Capillary 114 (H) 70 - 99 mg/dL  Glucose, capillary   Collection Time: 01/19/18  6:19 AM  Result Value Ref Range   Glucose-Capillary 73 70 - 99 mg/dL  Glucose, capillary   Collection Time: 01/19/18 11:11 AM  Result Value Ref Range   Glucose-Capillary 81 70 - 99 mg/dL    Medications:  Scheduled . docusate sodium  100 mg Oral Daily  . famotidine  20 mg Oral BID  . prenatal vitamin w/FE, FA  1 tablet Oral Q1200   I have reviewed the patient's current medications.  ASSESSMENT: Active Problems:   Placental abruption  Stable    Constipation  New  PLAN: Inpatient monitoring x at least 7 days following bleed or labor Avoid tocolytics Miralax prn in addition to colace-continue. FHR reassuring  Reva Bores, MD 01/19/2018,2:15 PM

## 2018-01-20 LAB — GLUCOSE, CAPILLARY
GLUCOSE-CAPILLARY: 80 mg/dL (ref 70–99)
Glucose-Capillary: 80 mg/dL (ref 70–99)
Glucose-Capillary: 105 mg/dL — ABNORMAL HIGH (ref 70–99)
Glucose-Capillary: 135 mg/dL — ABNORMAL HIGH (ref 70–99)

## 2018-01-20 MED ORDER — CYCLOBENZAPRINE HCL 10 MG PO TABS
10.0000 mg | ORAL_TABLET | Freq: Three times a day (TID) | ORAL | Status: DC | PRN
  Administered 2018-01-20: 10 mg via ORAL
  Filled 2018-01-20 (×2): qty 1

## 2018-01-20 MED ORDER — OXYCODONE HCL 5 MG PO TABS
10.0000 mg | ORAL_TABLET | ORAL | Status: DC | PRN
  Administered 2018-01-20 – 2018-01-22 (×5): 10 mg via ORAL
  Filled 2018-01-20 (×5): qty 2

## 2018-01-20 NOTE — Progress Notes (Signed)
Initial Nutrition Assessment  DOCUMENTATION CODES:   Not applicable  INTERVENTION:  Carbohydrate modified gestational diet Double protein portions if desired  NUTRITION DIAGNOSIS:  Increased nutrient needs related to (pregnancy and fetal growth requirements) as evidenced by (34 weeks IUP). GOAL:  Patient will meet greater than or equal to 90% of their needs  MONITOR:  Weight trends REASON FOR ASSESSMENT:  Antenatal, Gestational Diabetes   ASSESSMENT:  34 6/7 weeks, 2nd adm for bleeding, abruption. Diet controlled GDM. weighed 158 Lbs on 07/07/17, BMI then was 28.1, since has experienced 36 lb weight gain.  Diet Order:   Diet Order            Diet gestational carb mod Room service appropriate? Yes; Fluid consistency: Thin  Diet effective now             EDUCATION NEEDS:   No education needs have been identified at this time(outpt educ on GDM occured 12/21/17)  Skin:  Skin Assessment: Reviewed RN Assessment Height:   Ht Readings from Last 1 Encounters:  01/18/18 5\' 3"  (1.6 m)    Weight:   Wt Readings from Last 1 Encounters:  01/18/18 88.6 kg    Ideal Body Weight:   115 lbs  BMI:  Body mass index is 34.59 kg/m.  Estimated Nutritional Needs:   Kcal:  1900-2100  Protein:  85-95 g  Fluid:  2.2 L    Elisabeth Cara M.Odis Luster LDN Neonatal Nutrition Support Specialist/RD III Pager [phone]      Phone [phone]

## 2018-01-20 NOTE — Progress Notes (Signed)
Patient ID: [name] Moreno, female   DOB: July 24, 1980, 37 y.o.   MRN: [id number] FACULTY PRACTICE ANTEPARTUM(COMPREHENSIVE) NOTE  [name] Moreno is a 37 y.o. [id number] at [redacted]w[redacted]d by best clinical estimate who is admitted for third trimester bleeding.   Fetal presentation is cephalic. Length of Stay:  2  Days  Subjective: Still with brown bleeding. Has headache today, unrelieved with tylenol Patient reports the fetal movement as active. Patient reports uterine contraction  activity as none. Patient reports  vaginal bleeding as none. Patient describes fluid per vagina as None.  Vitals:  Blood pressure 123/64, pulse 92, temperature 98 F (36.7 C), resp. rate 18, height 5\' 3"  (1.6 m), weight 88.6 kg, last menstrual period 05/21/2017, SpO2 100 %. Physical Examination:  General appearance - alert, well appearing, and in no distress Chest - normal effort Abdomen - gravid, non-tender Fundal Height:  size equals dates Extremities: extremities normal, atraumatic, no cyanosis or edema  Membranes:intact  Fetal Monitoring:  Baseline: 140 bpm, Variability: Good {> 6 bpm), Accelerations: Reactive and Decelerations: Absent  Labs:  Results for orders placed or performed during the hospital encounter of 01/18/18 (from the past 24 hour(s))  Glucose, capillary   Collection Time: 01/19/18  3:51 PM  Result Value Ref Range   Glucose-Capillary 108 (H) 70 - 99 mg/dL   Comment 1 Notify RN    Comment 2 Document in Chart   Glucose, capillary   Collection Time: 01/19/18 11:30 PM  Result Value Ref Range   Glucose-Capillary 139 (H) 70 - 99 mg/dL  Glucose, capillary   Collection Time: 01/20/18  6:13 AM  Result Value Ref Range   Glucose-Capillary 80 70 - 99 mg/dL  Glucose, capillary   Collection Time: 01/20/18 12:34 PM  Result Value Ref Range   Glucose-Capillary 80 70 - 99 mg/dL    Medications:  Scheduled . docusate sodium  100 mg Oral Daily  . famotidine  20 mg Oral BID  . prenatal vitamin w/FE, FA   1 tablet Oral Q1200   I have reviewed the patient's current medications.  ASSESSMENT: Active Problems:   Placental abruption   Headache  PLAN: Trial of flexeril Continue inpatient monitoring.  Reva Bores, MD 01/20/2018,1:41 PM

## 2018-01-20 NOTE — Plan of Care (Signed)
  Problem: Education: Goal: Knowledge of disease or condition will improve Outcome: Progressing   Problem: Pain Management: Goal: Relief or control of pain will improve Outcome: Progressing   

## 2018-01-21 LAB — CBC
HCT: 26.6 % — ABNORMAL LOW (ref 36.0–46.0)
Hemoglobin: 8.7 g/dL — ABNORMAL LOW (ref 12.0–15.0)
MCH: 24.4 pg — AB (ref 26.0–34.0)
MCHC: 32.7 g/dL (ref 30.0–36.0)
MCV: 74.7 fL — ABNORMAL LOW (ref 78.0–100.0)
PLATELETS: 153 10*3/uL (ref 150–400)
RBC: 3.56 MIL/uL — AB (ref 3.87–5.11)
RDW: 15.4 % (ref 11.5–15.5)
WBC: 7.2 10*3/uL (ref 4.0–10.5)

## 2018-01-21 LAB — GLUCOSE, CAPILLARY
GLUCOSE-CAPILLARY: 75 mg/dL (ref 70–99)
GLUCOSE-CAPILLARY: 155 mg/dL — AB (ref 70–99)
Glucose-Capillary: 88 mg/dL (ref 70–99)

## 2018-01-21 LAB — TYPE AND SCREEN
ABO/RH(D): A POS
ANTIBODY SCREEN: NEGATIVE

## 2018-01-21 MED ORDER — FENTANYL CITRATE (PF) 100 MCG/2ML IJ SOLN
100.0000 ug | Freq: Once | INTRAMUSCULAR | Status: AC
  Administered 2018-01-21: 100 ug via INTRAVENOUS
  Filled 2018-01-21: qty 2

## 2018-01-21 MED ORDER — FENTANYL CITRATE (PF) 100 MCG/2ML IJ SOLN
50.0000 ug | Freq: Once | INTRAMUSCULAR | Status: AC
  Administered 2018-01-21: 50 ug via INTRAVENOUS
  Filled 2018-01-21: qty 2

## 2018-01-21 NOTE — Progress Notes (Signed)
Patient ID: [name] Moreno, female   DOB: [birth year], 37 y.o.   FACULTY PRACTICE ANTEPARTUM(COMPREHENSIVE) NOTE  Ruari Duggan is a 37 y.o. [id number] at [redacted]w[redacted]d by best clinical estimate who is admitted for vaginal bleeding with suspected abruption.   Fetal presentation is cephalic. Length of Stay:  3  Days  Subjective: Headache resolved Patient reports the fetal movement as active. Patient reports uterine contraction  activity as cramping. Patient reports  vaginal bleeding as spotting bright red. Patient describes fluid per vagina as None.  Vitals:  Blood pressure 111/67, pulse 74, temperature 98.1 F (36.7 C), temperature source Oral, resp. rate 16, height 5\' 3"  (1.6 m), weight 88.6 kg, last menstrual period 05/21/2017, SpO2 100 %. Physical Examination:  General appearance - alert, well appearing, and in no distress Heart - normal rate and regular rhythm Abdomen - soft, nontender, nondistended Fundal Height:  size equals dates Cervical Exam: Not evaluated Extremities: extremities normal, atraumatic, no cyanosis or edema and Homans sign is negative, no sign of DVT with DTRs 2+ bilaterally Membranes:intact  Fetal Monitoring:   Fetal Heart Rate A  Mode External filed at 01/20/2018 2302  Baseline Rate (A) 130 bpm filed at 01/20/2018 2302  Variability <5 BPM, 6-25 BPM filed at 01/20/2018 2302  Accelerations 15 x 15 filed at 01/20/2018 2302  Decelerations None filed at 01/20/2018 2302    Labs:  Results for orders placed or performed during the hospital encounter of 01/18/18 (from the past 24 hour(s))  Glucose, capillary   Collection Time: 01/20/18 12:34 PM  Result Value Ref Range   Glucose-Capillary 80 70 - 99 mg/dL  Glucose, capillary   Collection Time: 01/20/18  4:22 PM  Result Value Ref Range   Glucose-Capillary 105 (H) 70 - 99 mg/dL  Glucose, capillary   Collection Time: 01/20/18  9:03 PM  Result Value Ref Range   Glucose-Capillary 135 (H) 70 - 99 mg/dL  CBC   Collection Time: 01/21/18  5:46 AM  Result Value Ref Range   WBC 7.2 4.0 - 10.5 K/uL   RBC 3.56 (L) 3.87 - 5.11 MIL/uL   Hemoglobin 8.7 (L) 12.0 - 15.0 g/dL   HCT 32.4 (L) 40.1 - 02.7 %   MCV 74.7 (L) 78.0 - 100.0 fL   MCH 24.4 (L) 26.0 - 34.0 pg   MCHC 32.7 30.0 - 36.0 g/dL   RDW 25.3 66.4 - 40.3 %   Platelets 153 150 - 400 K/uL  Type and screen [facility]   Collection Time: 01/21/18  5:46 AM  Result Value Ref Range   ABO/RH(D) A POS    Antibody Screen NEG    Sample Expiration      01/24/2018 Performed at [facility], [address], [place], [place] [address]   Glucose, capillary   Collection Time: 01/21/18  6:04 AM  Result Value Ref Range   Glucose-Capillary 75 70 - 99 mg/dL      Medications:  Scheduled . docusate sodium  100 mg Oral Daily  . famotidine  20 mg Oral BID  . prenatal vitamin w/FE, FA  1 tablet Oral Q1200   I have reviewed the patient's current medications.  ASSESSMENT: Patient Active Problem List   Diagnosis Date Noted  . Placental abruption 01/18/2018  . Anemia affecting pregnancy in third trimester 01/10/2018  . Preterm labor in third trimester 01/08/2018  . GBS bacteriuria 01/02/2018  . Gestational diabetes 12/03/2017  . Benign cyst of right breast 10/13/2017  . Supervision of high-risk pregnancy 08/15/2017  .  Third trimester bleeding, antepartum 08/15/2017  . Previous preterm delivery at 36 weeks, antepartum 08/15/2017  . History of placental abruption 08/15/2017  . Depression   . Multinodular goiter 01/04/2012    PLAN: Observe for sign of labor or increased vaginal bleeding  Scheryl Darter 01/21/2018,9:29 AM

## 2018-01-21 NOTE — Progress Notes (Signed)
Pt states she has had no pain relief since 50 mcg fentanyl dose at 10:45. Pt rates pain 10/10 when the contractions come. Monitor has not ben picking them up/ Pt states the contractions come about every 15 minutes. Adjusted toco and gave pt button to press whenever she feels a contraction.  Will continue to monitor.

## 2018-01-22 LAB — GLUCOSE, CAPILLARY
Glucose-Capillary: 96 mg/dL (ref 70–99)
Glucose-Capillary: 105 mg/dL — ABNORMAL HIGH (ref 70–99)
Glucose-Capillary: 144 mg/dL — ABNORMAL HIGH (ref 70–99)
Glucose-Capillary: 125 mg/dL — ABNORMAL HIGH (ref 70–99)

## 2018-01-22 LAB — CBC
HCT: 27.5 % — ABNORMAL LOW (ref 36.0–46.0)
HEMOGLOBIN: 9.1 g/dL — AB (ref 12.0–15.0)
MCH: 24.7 pg — AB (ref 26.0–34.0)
MCHC: 33.1 g/dL (ref 30.0–36.0)
MCV: 74.5 fL — ABNORMAL LOW (ref 78.0–100.0)
Platelets: 160 10*3/uL (ref 150–400)
RBC: 3.69 MIL/uL — ABNORMAL LOW (ref 3.87–5.11)
RDW: 15.5 % (ref 11.5–15.5)
WBC: 9.3 10*3/uL (ref 4.0–10.5)

## 2018-01-22 MED ORDER — LACTATED RINGERS IV BOLUS
500.0000 mL | Freq: Once | INTRAVENOUS | Status: AC
  Administered 2018-01-22: 500 mL via INTRAVENOUS

## 2018-01-22 NOTE — Progress Notes (Signed)
Patient ID: [name] Moreno, female   DOB: 12/14/1980, 37 y.o.   MRN: [id number] FACULTY PRACTICE ANTEPARTUM(COMPREHENSIVE) NOTE  [name] Moreno is a 37 y.o. [id number] at [redacted]w[redacted]d by best clinical estimate who is admitted for Preterm labor, bleeding.   Fetal presentation is cephalic. Length of Stay:  4  Days  Subjective: Back pain , leg pain, occasional contractions Patient reports the fetal movement as active. Patient reports uterine contraction  activity as irregular. Patient reports  vaginal bleeding as spotting. Patient describes fluid per vagina as None.  Vitals:  Blood pressure (!) 100/53, pulse 82, temperature 97.9 F (36.6 C), temperature source Oral, resp. rate 18, height 5\' 3"  (1.6 m), weight 88.6 kg, last menstrual period 05/21/2017, SpO2 100 %. Physical Examination:  General appearance - alert, well appearing, and in no distress Heart - normal rate and regular rhythm Abdomen - soft, nontender, nondistended Fundal Height:  size equals dates Cervical Exam: Not evaluated. Extremities: extremities normal, atraumatic, no cyanosis or edema and Homans sign is negative, no sign of DVT Membranes:intact  Fetal Monitoring:  Baseline: 130 bpm, Variability: Good {> 6 bpm), Accelerations: Reactive and Decelerations: Absent  Labs:  Results for orders placed or performed during the hospital encounter of 01/18/18 (from the past 24 hour(s))  Glucose, capillary   Collection Time: 01/21/18 12:09 PM  Result Value Ref Range   Glucose-Capillary 88 70 - 99 mg/dL  Glucose, capillary   Collection Time: 01/21/18  6:56 PM  Result Value Ref Range   Glucose-Capillary 155 (H) 70 - 99 mg/dL  Glucose, capillary   Collection Time: 01/22/18  7:02 AM  Result Value Ref Range   Glucose-Capillary 96 70 - 99 mg/dL    Imaging Studies:      Medications:  Scheduled . docusate sodium  100 mg Oral Daily  . famotidine  20 mg Oral BID  . prenatal vitamin w/FE, FA  1 tablet Oral Q1200   I have reviewed  the patient's current medications.  ASSESSMENT: Patient Active Problem List   Diagnosis Date Noted  . Placental abruption 01/18/2018  . Anemia affecting pregnancy in third trimester 01/10/2018  . Preterm labor in third trimester 01/08/2018  . GBS bacteriuria 01/02/2018  . Gestational diabetes 12/03/2017  . Benign cyst of right breast 10/13/2017  . Supervision of high-risk pregnancy 08/15/2017  . Third trimester bleeding, antepartum 08/15/2017  . Previous preterm delivery at 36 weeks, antepartum 08/15/2017  . History of placental abruption 08/15/2017  . Depression   . Multinodular goiter 01/04/2012    PLAN: Old blood and some red blood on pad but scant. Observe for s/sx increased bleeding or preterm labor  Scheryl Darter 01/22/2018,7:52 AM

## 2018-01-22 NOTE — Progress Notes (Signed)
Patient ID: [name] Moreno, female   DOB: [birth year], 37 y.o.   MRN: [id number] CTSP for contractions and bleeding.  Pt reports that her bleeding picked up earlier today and she reports having occ increased contractions.  Pt in NAD BP (!) 105/54 (BP Location: Left Arm)   Pulse 88   Temp 97.8 F (36.6 C) (Oral)   Resp 18   Ht 5\' 3"  (1.6 m)   Wt 88.6 kg   LMP 05/21/2017 (Exact Date)   SpO2 99%   BMI 34.59 kg/m  FHR: 130's reactive; +accles; no decels noted.    Pt is resting comfortably; she was able to converse with no evidence of contractions or pain during our >15 min conversation.   Abd: soft, NT, ND GU: there is blood on the pad. No excessive bleeding and no clots noted.   A/P Pt with bleeding ante. Presumed placental abruption. Pt is clinically stable and the fetus appears to be stable as well. I explained to pt that with a stable abruption as long as mom and baby are stable we will attempt to assist her with maintaining her pregnancy. I reviewed with her the risks assoc with prematurity and emphasized that every preterm neonate can respond differently . All of her questions were answered. She will remain in the hosp until delivery as long as her clinical cond remains the same.   Will deliver prior to 37 weeks based on change in maternal of fetal condition.  Calyn Sivils L. Harraway-Smith, M.D., [facility]

## 2018-01-23 DIAGNOSIS — O42919 Preterm premature rupture of membranes, unspecified as to length of time between rupture and onset of labor, unspecified trimester: Secondary | ICD-10-CM

## 2018-01-23 LAB — CBC
HEMATOCRIT: 28.7 % — AB (ref 36.0–46.0)
HEMOGLOBIN: 9.4 g/dL — AB (ref 12.0–15.0)
MCH: 24.5 pg — ABNORMAL LOW (ref 26.0–34.0)
MCHC: 32.8 g/dL (ref 30.0–36.0)
MCV: 74.7 fL — AB (ref 78.0–100.0)
Platelets: 201 10*3/uL (ref 150–400)
RBC: 3.84 MIL/uL — ABNORMAL LOW (ref 3.87–5.11)
RDW: 15.8 % — ABNORMAL HIGH (ref 11.5–15.5)
WBC: 10.6 10*3/uL — AB (ref 4.0–10.5)

## 2018-01-23 LAB — GLUCOSE, CAPILLARY
GLUCOSE-CAPILLARY: 95 mg/dL (ref 70–99)
Glucose-Capillary: 129 mg/dL — ABNORMAL HIGH (ref 70–99)

## 2018-01-23 MED ORDER — ACETAMINOPHEN 325 MG PO TABS: 650.0000 mg | ORAL_TABLET | ORAL | Status: DC | PRN

## 2018-01-23 MED ORDER — HYDROXYPROGESTERONE CAPROATE 275 MG/1.1ML ~~LOC~~ SOAJ
275.0000 mg | Freq: Once | SUBCUTANEOUS | Status: AC
  Administered 2018-01-23: 275 mg via SUBCUTANEOUS

## 2018-01-23 MED ORDER — LIDOCAINE HCL (PF) 1 % IJ SOLN: 30.0000 mL | INTRAMUSCULAR | Status: DC | PRN

## 2018-01-23 MED ORDER — LACTATED RINGERS IV SOLN
INTRAVENOUS | Status: DC
  Administered 2018-01-23 – 2018-01-24 (×2): via INTRAVENOUS

## 2018-01-23 MED ORDER — OXYTOCIN 40 UNITS IN LACTATED RINGERS INFUSION - SIMPLE MED: 2.5000 [IU]/h | INTRAVENOUS | Status: DC

## 2018-01-23 MED ORDER — SOD CITRATE-CITRIC ACID 500-334 MG/5ML PO SOLN
30.0000 mL | ORAL | Status: DC | PRN
  Administered 2018-01-24: 30 mL via ORAL
  Filled 2018-01-23: qty 15

## 2018-01-23 MED ORDER — EPHEDRINE 5 MG/ML INJ: 10.0000 mg | INTRAVENOUS | Status: DC | PRN

## 2018-01-23 MED ORDER — SODIUM CHLORIDE 0.9 % IV SOLN
5.0000 10*6.[IU] | Freq: Once | INTRAVENOUS | Status: AC
  Administered 2018-01-23: 5 10*6.[IU] via INTRAVENOUS
  Filled 2018-01-23: qty 5

## 2018-01-23 MED ORDER — DIPHENHYDRAMINE HCL 50 MG/ML IJ SOLN: 12.5000 mg | INTRAMUSCULAR | Status: DC | PRN

## 2018-01-23 MED ORDER — HYDROXYPROGESTERONE CAPROATE 250 MG/ML IM OIL: 250.0000 mg | TOPICAL_OIL | Freq: Once | INTRAMUSCULAR | Status: DC

## 2018-01-23 MED ORDER — LACTATED RINGERS IV SOLN
500.0000 mL | Freq: Once | INTRAVENOUS | Status: AC
  Administered 2018-01-23: 500 mL via INTRAVENOUS

## 2018-01-23 MED ORDER — LACTATED RINGERS IV SOLN
500.0000 mL | INTRAVENOUS | Status: DC | PRN
  Administered 2018-01-23 – 2018-01-24 (×3): 500 mL via INTRAVENOUS

## 2018-01-23 MED ORDER — OXYTOCIN BOLUS FROM INFUSION: 500.0000 mL | Freq: Once | INTRAVENOUS | Status: DC

## 2018-01-23 MED ORDER — PHENYLEPHRINE 40 MCG/ML (10ML) SYRINGE FOR IV PUSH (FOR BLOOD PRESSURE SUPPORT)
80.0000 ug | PREFILLED_SYRINGE | INTRAVENOUS | Status: DC | PRN
  Administered 2018-01-24: 80 ug via INTRAVENOUS
  Filled 2018-01-23: qty 10

## 2018-01-23 MED ORDER — ONDANSETRON HCL 4 MG/2ML IJ SOLN: 4.0000 mg | Freq: Four times a day (QID) | INTRAMUSCULAR | Status: DC | PRN

## 2018-01-23 MED ORDER — PHENYLEPHRINE 40 MCG/ML (10ML) SYRINGE FOR IV PUSH (FOR BLOOD PRESSURE SUPPORT): 80.0000 ug | PREFILLED_SYRINGE | INTRAVENOUS | Status: DC | PRN

## 2018-01-23 MED ORDER — PENICILLIN G 3 MILLION UNITS IVPB - SIMPLE MED
3.0000 10*6.[IU] | INTRAVENOUS | Status: DC
  Administered 2018-01-24: 3 10*6.[IU] via INTRAVENOUS
  Filled 2018-01-23: qty 100

## 2018-01-23 MED ORDER — FENTANYL 2.5 MCG/ML BUPIVACAINE 1/10 % EPIDURAL INFUSION (WH - ANES)
14.0000 mL/h | INTRAMUSCULAR | Status: DC | PRN
  Administered 2018-01-24: 14 mL/h via EPIDURAL
  Filled 2018-01-23: qty 100

## 2018-01-23 NOTE — Progress Notes (Signed)
Patient ID: [name] [facility], female   DOB: 11-09-1980, 37 y.o.   MRN: [id number] FACULTY PRACTICE ANTEPARTUM(COMPREHENSIVE) NOTE  [name] Moreno is a 37 y.o. [id number] at [redacted]w[redacted]d by best clinical estimate who is admitted for Preterm labor, bleeding.   Fetal presentation is cephalic. Length of Stay:  5  Days  Subjective: No bleeding since yesterday, ccasional contractions Patient reports the fetal movement as active. Patient reports uterine contraction  activity as irregular. Patient reports  vaginal bleeding as spotting. Patient describes fluid per vagina as None.  Vitals:  Blood pressure 99/62, pulse 89, temperature 98.2 F (36.8 C), temperature source Oral, resp. rate 17, height 5\' 3"  (1.6 m), weight 88.6 kg, last menstrual period 05/21/2017, SpO2 99 %. Physical Examination: General appearance - alert, well appearing, and in no distress Heart - normal rate and regular rhythm Abdomen - soft, nontender, nondistended Fundal Height:  size equals dates Cervical Exam: Not evaluated. Extremities: extremities normal, atraumatic, no cyanosis or edema and Homans sign is negative, no sign of DVT Membranes:intact  Fetal Monitoring:  Baseline: 130 bpm, Variability: Good {> 6 bpm), Accelerations: Reactive and Decelerations: Absent  Labs:  Results for orders placed or performed during the hospital encounter of 01/18/18 (from the past 24 hour(s))  Glucose, capillary   Collection Time: 01/22/18 11:04 AM  Result Value Ref Range   Glucose-Capillary 105 (H) 70 - 99 mg/dL  CBC   Collection Time: 01/22/18 11:24 AM  Result Value Ref Range   WBC 9.3 4.0 - 10.5 K/uL   RBC 3.69 (L) 3.87 - 5.11 MIL/uL   Hemoglobin 9.1 (L) 12.0 - 15.0 g/dL   HCT 30.8 (L) 65.7 - 84.6 %   MCV 74.5 (L) 78.0 - 100.0 fL   MCH 24.7 (L) 26.0 - 34.0 pg   MCHC 33.1 30.0 - 36.0 g/dL   RDW 96.2 95.2 - 84.1 %   Platelets 160 150 - 400 K/uL  Glucose, capillary   Collection Time: 01/22/18  5:14 PM  Result Value Ref Range    Glucose-Capillary 144 (H) 70 - 99 mg/dL  Glucose, capillary   Collection Time: 01/22/18 10:41 PM  Result Value Ref Range   Glucose-Capillary 125 (H) 70 - 99 mg/dL  Glucose, capillary   Collection Time: 01/23/18  5:49 AM  Result Value Ref Range   Glucose-Capillary 95 70 - 99 mg/dL    Imaging Studies:    [place] Mfm Ob Transvaginal  Result Date: 01/05/2018 ----------------------------------------------------------------------  OBSTETRICS REPORT                       (Signed Final 01/05/2018 05:54 pm) ---------------------------------------------------------------------- Patient Info  ID #:       [id number]                          D.O.B.:  03-29-1981 (37 yrs)  Name:       [name] Moreno                Visit Date: 01/05/2018 05:48 pm ---------------------------------------------------------------------- Performed By  Performed By:     Eden Lathe BS      Ref. Address:     [address]                    RDMS RVT  [address]                                                             [place], [place]                                                             [address]  Attending:        Noralee Space MD        Location:         [facility]  Referred By:      Tereso Newcomer MD ---------------------------------------------------------------------- Orders   #  Description                          Code         Ordered By   1  [place] MFM OB LIMITED                    60454.09     Clayton Bibles   2  [place] MFM OB TRANSVAGINAL               81191.4      Clayton Bibles  ----------------------------------------------------------------------   #  Order #                    Accession #                 Episode #   1  [id number]                  [id number]                  [id number]   2  [id number]                  [id number]                   [id number]  ---------------------------------------------------------------------- Indications   Vaginal bleeding in pregnancy, third trimester O46.93   Poor obstetric history: Previous preterm       O09.219   delivery, antepartum (@ 36wks, placental   abruption; started 17P injections 09/14/17)   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     O99.280, E07.9   goiter)   Medical complication of pregnancy (Heart  O26.90   murmur)   Placenta previa specified as without           O44.03   hemorrhage, third trimester - resolved   [redacted] weeks gestation of pregnancy                Z3A.32  ---------------------------------------------------------------------- Vital Signs                                                 Height:        5'3" ---------------------------------------------------------------------- Fetal Evaluation  Num Of Fetuses:         1  Fetal Heart Rate(bpm):  128  Cardiac Activity:       Observed  Presentation:           Cephalic  Placenta:               Posterior  P. Cord Insertion:      Previously Visualized  Amniotic Fluid  AFI FV:      Within normal limits  AFI Sum(cm)     %Tile       Largest Pocket(cm)  15.2            54          6.18  RUQ(cm)       RLQ(cm)       LUQ(cm)        LLQ(cm)  4.2           2.36          2.46           6.18  Comment:    No placental abruption or previa identified. ---------------------------------------------------------------------- OB History  Gravidity:    3         Prem:   1         SAB:   1  Living:       1 ---------------------------------------------------------------------- Gestational Age  LMP:           32w 5d        Date:  05/21/17                 EDD:   02/25/18  Best:          [name] 5d     Det. By:  LMP  (05/21/17)          EDD:   02/25/18 ---------------------------------------------------------------------- Cervix Uterus Adnexa  Cervix  Length:            2.8  cm.  Measured transvaginally.  Uterus  No abnormality visualized.  Cul De Sac  No free fluid seen.   Adnexa  No abnormality visualized. ---------------------------------------------------------------------- Impression  Patient is being evaluated in the MAU for c/o vaginal  bleeding. She reports that she had passed several blood  clots yesterday and was admitted to [facility] (she  was visiting her relatives in [place]). No vaginal  examination was performed at the hospital. She was  discharged later.  She reports persistent slight vaginal bleeding today. No  abdominal pain or uterine contractions.  She had sexual intercourse 2 weeks ago.  A limited ultrasound was performed. Amniotic fluid is normal  and good fetal activity is seen. Placenta is posterior and the  entire placenta could not be evaluated because of overlying  fetus. No obvious placental abruption is seen. The cervix  measures 2.8 cm, which is normal. There is no placenta  previa.  Vaginal probe cover was not blood stained.  I reassured the patient of the findings and also informed her  that placental abruption cannot be ruled out on ultrasound.  Patient was transferred back to the MAU for further  management. ---------------------------------------------------------------------- Recommendations  Follow-up scans as clinically indicated. ----------------------------------------------------------------------                  Noralee Space, MD Electronically Signed Final Report   01/05/2018 05:54 pm ----------------------------------------------------------------------  [place] Mfm Ob Limited  Result Date: 01/18/2018 ----------------------------------------------------------------------  OBSTETRICS REPORT                       (Signed Final 01/18/2018 08:46 am) ---------------------------------------------------------------------- Patient Info  ID #:       [id number]                          D.O.B.:  Nov 23, 1980 (37 yrs)  Name:       [facility]                Visit Date: 01/18/2018 06:29 am  ---------------------------------------------------------------------- Performed By  Performed By:     Ellin Saba        Referred By:      MAU Nursing-                    RDMS                                     MAU/Triage  Attending:        Noralee Space MD        Location:         [facility] ---------------------------------------------------------------------- Orders   #  Description                          Code         Ordered By   1  [place] MFM OB LIMITED                    40981.19     Judeth Horn  ----------------------------------------------------------------------   #  Order #                    Accession #                 Episode #   1  [id number]                  [id number]                  [id number]  ---------------------------------------------------------------------- Indications   Vaginal bleeding in pregnancy, third trimester O46.93   [redacted] weeks gestation of pregnancy                Z3A.34   Poor obstetric history: Previous preterm       O09.219   delivery, antepartum (@ 36wks, placental   abruption; started 17P injections 09/14/17)   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     O99.280, E07.9   goiter)   Medical complication of pregnancy (Heart       O26.90   murmur)   Placenta previa specified as without  O44.03   hemorrhage, third trimester - resolved  ---------------------------------------------------------------------- Vital Signs                                                 Height:        5'3" ---------------------------------------------------------------------- Fetal Evaluation  Num Of Fetuses:         1  Fetal Heart Rate(bpm):  134  Cardiac Activity:       Observed  Presentation:           Cephalic  Placenta:               Posterior Fundal  Amniotic Fluid  AFI FV:      Within normal limits  AFI Sum(cm)     %Tile       Largest Pocket(cm)  15.56           56          6.13  RUQ(cm)                     LUQ(cm)        LLQ(cm)  6.13                        5.8            3.63   Comment:    No apparent abruption. Portions of placenta are obscurred by              fetal parts. ---------------------------------------------------------------------- OB History  Gravidity:    3         Prem:   1         SAB:   1  Living:       1 ---------------------------------------------------------------------- Gestational Age  LMP:           34w 4d        Date:  05/21/17                 EDD:   02/25/18  Best:          34w 4d     Det. By:  LMP  (05/21/17)          EDD:   02/25/18 ---------------------------------------------------------------------- Cervix Uterus Adnexa  Cervix  Not visualized (advanced GA >24wks)  Left Ovary  Previously seen.  Right Ovary  Previously seen ---------------------------------------------------------------------- Impression  Patient was admitted with c/o vaginal bleeding.  A limited ultrasound study was performed. Amniotic fluid is  normal and good fetal activity is seen. Placenta looks normal  with no evidence of abruption. Ultrasound has limitations in  diagnosing placental abruption. ---------------------------------------------------------------------- Recommendations  BPP this week. ----------------------------------------------------------------------                  Noralee Space, MD Electronically Signed Final Report   01/18/2018 08:46 am ----------------------------------------------------------------------  [place] Mfm Ob Limited  Result Date: 01/08/2018 ----------------------------------------------------------------------  OBSTETRICS REPORT                        (Signed Final 01/08/2018 05:27 am) ---------------------------------------------------------------------- Patient Info  ID #:       [id number]                          D.O.B.:  1981-01-23 (37 yrs)  Name:       [facility]                Visit Date: 01/08/2018 03:00 am ---------------------------------------------------------------------- Performed By  Performed By:     Earley Brooke     Ref. Address:       [address] RDMS                                                              [address]                                                              [place], [place]                                                              [address]  Attending:        MFM Provider           Secondary Phy.:    L&D Nursing- L/D                                                              [id number]  Referred By:      Jethro Bastos               Location:          [facility] MD ---------------------------------------------------------------------- Orders   #  Description                           Code        Ordered By   1  [place] MFM OB LIMITED                     01027.25    Jaynie Collins  ----------------------------------------------------------------------   #  Order #                     Accession #                Episode #   1  [id number]                   [id number]                 [id number]  ---------------------------------------------------------------------- Indications   [redacted] weeks gestation of pregnancy  Z3A.33   Vaginal bleeding in pregnancy, third trimester O46.93  ---------------------------------------------------------------------- Vital Signs                                                 Height:        5'3" ---------------------------------------------------------------------- Fetal Evaluation  Num Of Fetuses:          1  Fetal Heart Rate(bpm):   133  Cardiac Activity:        Observed  Presentation:            Cephalic  Placenta:                Posterior  Amniotic Fluid  AFI FV:      Within normal limits  AFI Sum(cm)     %Tile       Largest Pocket(cm)  16.51           60          6.01  RUQ(cm)       RLQ(cm)       LUQ(cm)        LLQ(cm)  3.93          5.08          6.01           1.49  Comment:    No placental abruption identified sonographically, however              portions of placenta obscured by fetal parts.  ---------------------------------------------------------------------- OB History  Gravidity:    3         Prem:   1         SAB:   1  Living:       1 ---------------------------------------------------------------------- Gestational Age  LMP:           33w 1d        Date:  05/21/17                 EDD:   02/25/18  Best:          33w 1d     Det. By:  LMP  (05/21/17)          EDD:   02/25/18 ---------------------------------------------------------------------- Cervix Uterus Adnexa  Cervix  Not visualized (advanced GA >24wks) ---------------------------------------------------------------------- Comments  Ultrasound images and findings reviewed.  No evidence of  placental abruption is seen on today's U/S.  Cervix reported  as 3 cm dilated based on digital exam.  Please remember  that the absence of an abruption on U/S does not rule it out.  Recommendations:           1) Serial U/S every 4 weeks for  fetal growth 2) Close A-P surveillance ---------------------------------------------------------------------- Recommendations  1) Serial U/S every 4 weeks for fetal growth 2) Close A-P  surveillance ----------------------------------------------------------------------               Patsi Sears, MD Electronically Signed Final Report   01/08/2018 05:27 am ----------------------------------------------------------------------  [place] Mfm Ob Limited  Result Date: 01/05/2018 ----------------------------------------------------------------------  OBSTETRICS REPORT                       (Signed Final 01/05/2018 05:54 pm) ---------------------------------------------------------------------- Patient Info  ID #:       [id number]  D.O.B.:  01-10-1981 (37 yrs)  Name:       [facility]                Visit Date: 01/05/2018 05:48 pm ---------------------------------------------------------------------- Performed By  Performed By:     Eden Lathe BS      Ref. Address:     [address]                     RDMS RVT                                                             [address]                                                             [place], [place]                                                             [address]  Attending:        Noralee Space MD        Location:         [facility]  Referred By:      Tereso Newcomer MD ---------------------------------------------------------------------- Orders   #  Description                          Code         Ordered By   1  [place] MFM OB LIMITED                    60454.09     Clayton Bibles   2  [place] MFM OB TRANSVAGINAL               81191.4      Clayton Bibles  ----------------------------------------------------------------------   #  Order #                    Accession #                 Episode #   1  [id number]  [id number]                  [id number]   2  [id number]                  [id number]                  [id number]  ---------------------------------------------------------------------- Indications   Vaginal bleeding in pregnancy, third trimester O46.93   Poor obstetric history: Previous preterm       O09.219   delivery, antepartum (@ 36wks, placental   abruption; started 17P injections 09/14/17)   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     O99.280, E07.9   goiter)   Medical complication of pregnancy (Heart       O26.90   murmur)   Placenta previa specified as without           O44.03   hemorrhage, third trimester - resolved   [redacted] weeks gestation of pregnancy                Z3A.32  ---------------------------------------------------------------------- Vital Signs                                                 Height:        5'3" ---------------------------------------------------------------------- Fetal Evaluation  Num Of Fetuses:         1  Fetal Heart Rate(bpm):  128  Cardiac Activity:       Observed   Presentation:           Cephalic  Placenta:               Posterior  P. Cord Insertion:      Previously Visualized  Amniotic Fluid  AFI FV:      Within normal limits  AFI Sum(cm)     %Tile       Largest Pocket(cm)  15.2            54          6.18  RUQ(cm)       RLQ(cm)       LUQ(cm)        LLQ(cm)  4.2           2.36          2.46           6.18  Comment:    No placental abruption or previa identified. ---------------------------------------------------------------------- OB History  Gravidity:    3         Prem:   1         SAB:   1  Living:       1 ---------------------------------------------------------------------- Gestational Age  LMP:           32w 5d        Date:  05/21/17                 EDD:   02/25/18  Best:          [name] 5d     Det. By:  LMP  (05/21/17)          EDD:   02/25/18 ---------------------------------------------------------------------- Cervix Uterus Adnexa  Cervix  Length:            2.8  cm.  Measured transvaginally.  Uterus  No abnormality visualized.  Cul De Sac  No free fluid seen.  Adnexa  No abnormality visualized. ---------------------------------------------------------------------- Impression  Patient is being evaluated in the MAU for c/o vaginal  bleeding. She reports that she had passed several blood  clots yesterday and was admitted to [facility] (she  was visiting her relatives in [place]). No vaginal  examination was performed at the hospital. She was  discharged later.  She reports persistent slight vaginal bleeding today. No  abdominal pain or uterine contractions.  She had sexual intercourse 2 weeks ago.  A limited ultrasound was performed. Amniotic fluid is normal  and good fetal activity is seen. Placenta is posterior and the  entire placenta could not be evaluated because of overlying  fetus. No obvious placental abruption is seen. The cervix  measures 2.8 cm, which is normal. There is no placenta  previa.  Vaginal probe cover was not blood stained.  I reassured  the patient of the findings and also informed her  that placental abruption cannot be ruled out on ultrasound.  Patient was transferred back to the MAU for further  management. ---------------------------------------------------------------------- Recommendations  Follow-up scans as clinically indicated. ----------------------------------------------------------------------                  Noralee Space, MD Electronically Signed Final Report   01/05/2018 05:54 pm ----------------------------------------------------------------------    Medications:  Scheduled . docusate sodium  100 mg Oral Daily  . famotidine  20 mg Oral BID  . prenatal vitamin w/FE, FA  1 tablet Oral Q1200   I have reviewed the patient's current medications.  ASSESSMENT: Principal Problem:   Placental abruption in third trimester Active Problems:   Supervision of high-risk pregnancy   Third trimester bleeding, antepartum   Previous preterm delivery at 36 weeks, antepartum   Gestational diabetes mellitus in third trimester   GBS bacteriuria   Preterm labor in third trimester   Anemia affecting pregnancy in third trimester    PLAN: Continue observation in hospital until delivery  Category 1 FHR tracing, s/p BMZ x 2 Maintain IV access at all times, check CBC and T&S q3 days Stable CBGs for now No signs/symptoms of progressing PTL Patient is aware of need for cesarean delivery for hemorrhage, NRFHT or other emergent  delivery indication Continue routine antepartum care.   Jaynie Collins, MD 01/23/2018,10:20 AM

## 2018-01-23 NOTE — Progress Notes (Signed)
CBG not taken at this time (1100) as patient had not eaten breakfast and had vomited.

## 2018-01-23 NOTE — Progress Notes (Signed)
Faculty Note  Asked to assess patient for leaking fluid and bleeding. Patient reports she has been leaking dark fluid since around 6:30 pm, feels like she has to urinate and "it just keeps coming." She reports still having light bleeding but is darker than it has been. Active fetal movement. She is feeling contractions.   BP 102/60 (BP Location: Left Arm)   Pulse 92   Temp 98.6 F (37 C)   Resp 16   Ht 5\' 3"  (1.6 m)   Wt 88.6 kg   LMP 05/21/2017 (Exact Date)   SpO2 100%   BMI 34.59 kg/m  Gen: alert, oriented SSE: pooling of dark red fluid SVE: 4/50/-2  Ferning: positive  A/P: 38 yo G3P0111 @ [redacted]w[redacted]d admitted for pre-term vaginal bleeding, now with PPROM. Will bring down to L&D for induction.   S/p BTMZ on 9/19-20  NICU aware gDMA1 GBS+ in urine --> ppx  K. Therese Sarah, M.D. [facility]

## 2018-01-23 NOTE — Anesthesia Preprocedure Evaluation (Addendum)
Anesthesia Evaluation  Patient identified by MRN, date of birth, ID band Patient awake    Reviewed: Allergy & Precautions, NPO status , Patient's Chart, lab work & pertinent test results  Airway Mallampati: II  TM Distance: >3 FB Neck ROM: Full    Dental no notable dental hx.    Pulmonary neg pulmonary ROS, former smoker,    Pulmonary exam normal breath sounds clear to auscultation       Cardiovascular negative cardio ROS Normal cardiovascular exam Rhythm:Regular Rate:Normal     Neuro/Psych  Headaches, Depression negative psych ROS   GI/Hepatic negative GI ROS, Neg liver ROS,   Endo/Other  diabetes, Gestational  Renal/GU negative Renal ROS  negative genitourinary   Musculoskeletal negative musculoskeletal ROS (+)   Abdominal   Peds negative pediatric ROS (+)  Hematology negative hematology ROS (+)   Anesthesia Other Findings   Reproductive/Obstetrics (+) Pregnancy                             Anesthesia Physical Anesthesia Plan  ASA: II and emergent  Anesthesia Plan: Epidural   Post-op Pain Management:    Induction:   PONV Risk Score and Plan: 2 and Treatment may vary due to age or medical condition  Airway Management Planned: Natural Airway  Additional Equipment:   Intra-op Plan:   Post-operative Plan:   Informed Consent: I have reviewed the patients History and Physical, chart, labs and discussed the procedure including the risks, benefits and alternatives for the proposed anesthesia with the patient or authorized representative who has indicated his/her understanding and acceptance.     Plan Discussed with:   Anesthesia Plan Comments:        Anesthesia Quick Evaluation

## 2018-01-23 NOTE — Progress Notes (Signed)
Faculty Note  In to see patient for variable decels.   She is very uncomfortable with painful contractions, declines epidural at this time.   BP 102/60 (BP Location: Left Arm)   Pulse 92   Temp 98.6 F (37 C)   Resp 16   Ht 5\' 3"  (1.6 m)   Wt 88.6 kg   LMP 05/21/2017 (Exact Date)   SpO2 100%   BMI 34.59 kg/m   SVE: 4-5/90/-1  FHT: moderate variability, variable decels, accels present  A/P: 37 yo [id number] @ [redacted]w[redacted]d who is for IOL for PPROM. She is contracting on her own with painful contractions. Still with some dark blood stained fluid. With variable decels, however FHT overall reassuring with accelerations and moderate variability. Reviewed that if there are prolonged decels, concern for fetal hypoxia or significant bleeding, would recommend we proceed with primary c-section. Patient expresses that she would prefer to avoid this if possible. She does consent to blood transfusion in the event of emergency. Will continue to closely monitor.   Baldemar Lenis, M.D. [facility] [facility]

## 2018-01-23 NOTE — Progress Notes (Signed)
Makena injection obtained from office. Submitted to Pharmacy, this will be given to patient.  Jaynie Collins, MD

## 2018-01-24 ENCOUNTER — Encounter (HOSPITAL_COMMUNITY)
Admission: AD | Disposition: A | Discharge status: Home / Self Care | Payer: Self-pay | Source: Home / Self Care | Attending: Family Medicine

## 2018-01-24 ENCOUNTER — Inpatient Hospital Stay (HOSPITAL_COMMUNITY): Payer: Medicaid Other | Admitting: Anesthesiology

## 2018-01-24 ENCOUNTER — Encounter: Payer: Medicaid Other | Admitting: Obstetrics and Gynecology

## 2018-01-24 ENCOUNTER — Encounter (HOSPITAL_COMMUNITY): Payer: Self-pay

## 2018-01-24 DIAGNOSIS — Z3A35 35 weeks gestation of pregnancy: Secondary | ICD-10-CM

## 2018-01-24 DIAGNOSIS — O42113 Preterm premature rupture of membranes, onset of labor more than 24 hours following rupture, third trimester: Secondary | ICD-10-CM

## 2018-01-24 DIAGNOSIS — O4593 Premature separation of placenta, unspecified, third trimester: Secondary | ICD-10-CM

## 2018-01-24 LAB — GLUCOSE, CAPILLARY
Glucose-Capillary: 117 mg/dL — ABNORMAL HIGH (ref 70–99)
Glucose-Capillary: 145 mg/dL — ABNORMAL HIGH (ref 70–99)

## 2018-01-24 LAB — CBC
HCT: 27.5 % — ABNORMAL LOW (ref 36.0–46.0)
Hemoglobin: 8.9 g/dL — ABNORMAL LOW (ref 12.0–15.0)
MCH: 24.3 pg — ABNORMAL LOW (ref 26.0–34.0)
MCHC: 32.4 g/dL (ref 30.0–36.0)
MCV: 75.1 fL — AB (ref 80.0–100.0)
PLATELETS: 152 10*3/uL (ref 150–400)
RBC: 3.66 MIL/uL — ABNORMAL LOW (ref 3.87–5.11)
RDW: 15.7 % — AB (ref 11.5–15.5)
WBC: 13.1 10*3/uL — AB (ref 4.0–10.5)

## 2018-01-24 LAB — TYPE AND SCREEN
ABO/RH(D): A POS
ANTIBODY SCREEN: NEGATIVE

## 2018-01-24 LAB — RPR: RPR Ser Ql: NONREACTIVE

## 2018-01-24 SURGERY — Surgical Case
Anesthesia: Epidural

## 2018-01-24 MED ORDER — TERBUTALINE SULFATE 1 MG/ML IJ SOLN
0.2500 mg | Freq: Once | INTRAMUSCULAR | Status: DC | PRN
  Filled 2018-01-24: qty 1

## 2018-01-24 MED ORDER — SIMETHICONE 80 MG PO CHEW
80.0000 mg | CHEWABLE_TABLET | ORAL | Status: DC
  Administered 2018-01-24 – 2018-01-26 (×3): 80 mg via ORAL
  Filled 2018-01-24 (×3): qty 1

## 2018-01-24 MED ORDER — COCONUT OIL OIL
1.0000 "application " | TOPICAL_OIL | Status: DC | PRN
  Filled 2018-01-24: qty 120

## 2018-01-24 MED ORDER — SODIUM CHLORIDE 0.9 % IV SOLN
500.0000 mg | Freq: Once | INTRAVENOUS | Status: DC
  Filled 2018-01-24: qty 500

## 2018-01-24 MED ORDER — HYDROMORPHONE HCL 1 MG/ML IJ SOLN
0.2500 mg | INTRAMUSCULAR | Status: DC | PRN
  Administered 2018-01-24: 0.5 mg via INTRAVENOUS
  Administered 2018-01-24 (×2): 0.25 mg via INTRAVENOUS

## 2018-01-24 MED ORDER — IBUPROFEN 600 MG PO TABS
600.0000 mg | ORAL_TABLET | Freq: Four times a day (QID) | ORAL | Status: DC
  Administered 2018-01-24 – 2018-01-27 (×13): 600 mg via ORAL
  Filled 2018-01-24 (×13): qty 1

## 2018-01-24 MED ORDER — KETOROLAC TROMETHAMINE 30 MG/ML IJ SOLN
INTRAMUSCULAR | Status: AC
  Filled 2018-01-24: qty 1

## 2018-01-24 MED ORDER — PRENATAL MULTIVITAMIN CH
1.0000 | ORAL_TABLET | Freq: Every day | ORAL | Status: DC
  Administered 2018-01-24 – 2018-01-27 (×4): 1 via ORAL
  Filled 2018-01-24 (×4): qty 1

## 2018-01-24 MED ORDER — SIMETHICONE 80 MG PO CHEW: 80.0000 mg | CHEWABLE_TABLET | ORAL | Status: DC | PRN

## 2018-01-24 MED ORDER — PROMETHAZINE HCL 25 MG/ML IJ SOLN: 6.2500 mg | INTRAMUSCULAR | Status: DC | PRN

## 2018-01-24 MED ORDER — DEXAMETHASONE SODIUM PHOSPHATE 4 MG/ML IJ SOLN
INTRAMUSCULAR | Status: AC
  Filled 2018-01-24: qty 1

## 2018-01-24 MED ORDER — MAGNESIUM HYDROXIDE 400 MG/5ML PO SUSP: 30.0000 mL | ORAL | Status: DC | PRN

## 2018-01-24 MED ORDER — ZOLPIDEM TARTRATE 5 MG PO TABS: 5.0000 mg | ORAL_TABLET | Freq: Every evening | ORAL | Status: DC | PRN

## 2018-01-24 MED ORDER — OXYTOCIN 40 UNITS IN LACTATED RINGERS INFUSION - SIMPLE MED: 2.5000 [IU]/h | INTRAVENOUS | Status: AC

## 2018-01-24 MED ORDER — SCOPOLAMINE 1 MG/3DAYS TD PT72
MEDICATED_PATCH | TRANSDERMAL | Status: AC
  Filled 2018-01-24: qty 1

## 2018-01-24 MED ORDER — OXYCODONE HCL 5 MG PO TABS: 5.0000 mg | ORAL_TABLET | Freq: Once | ORAL | Status: DC | PRN

## 2018-01-24 MED ORDER — DIBUCAINE 1 % RE OINT: 1.0000 "application " | TOPICAL_OINTMENT | RECTAL | Status: DC | PRN

## 2018-01-24 MED ORDER — NALBUPHINE HCL 10 MG/ML IJ SOLN: 5.0000 mg | Freq: Once | INTRAMUSCULAR | Status: DC | PRN

## 2018-01-24 MED ORDER — HYDROMORPHONE HCL 1 MG/ML IJ SOLN
INTRAMUSCULAR | Status: AC
  Filled 2018-01-24: qty 0.5

## 2018-01-24 MED ORDER — CEFAZOLIN SODIUM-DEXTROSE 2-4 GM/100ML-% IV SOLN
INTRAVENOUS | Status: AC
  Filled 2018-01-24: qty 100

## 2018-01-24 MED ORDER — LACTATED RINGERS IV SOLN
INTRAVENOUS | Status: DC
  Administered 2018-01-24: 15:00:00 via INTRAVENOUS

## 2018-01-24 MED ORDER — SENNOSIDES-DOCUSATE SODIUM 8.6-50 MG PO TABS
2.0000 | ORAL_TABLET | ORAL | Status: DC
  Administered 2018-01-24 – 2018-01-26 (×3): 2 via ORAL
  Filled 2018-01-24 (×3): qty 2

## 2018-01-24 MED ORDER — LIDOCAINE HCL (PF) 1 % IJ SOLN
INTRAMUSCULAR | Status: DC | PRN
  Administered 2018-01-24: 13 mL via EPIDURAL

## 2018-01-24 MED ORDER — DIPHENHYDRAMINE HCL 50 MG/ML IJ SOLN
12.5000 mg | INTRAMUSCULAR | Status: DC | PRN
  Administered 2018-01-24 – 2018-01-27 (×9): 12.5 mg via INTRAVENOUS
  Filled 2018-01-24 (×9): qty 1

## 2018-01-24 MED ORDER — NALBUPHINE HCL 10 MG/ML IJ SOLN
5.0000 mg | INTRAMUSCULAR | Status: DC | PRN
  Administered 2018-01-24 (×2): 5 mg via INTRAVENOUS
  Filled 2018-01-24 (×2): qty 1

## 2018-01-24 MED ORDER — MENTHOL 3 MG MT LOZG
1.0000 | LOZENGE | OROMUCOSAL | Status: DC | PRN
  Administered 2018-01-24 – 2018-01-25 (×2): 3 mg via ORAL
  Filled 2018-01-24 (×2): qty 9

## 2018-01-24 MED ORDER — NALOXONE HCL 4 MG/10ML IJ SOLN
1.0000 ug/kg/h | INTRAVENOUS | Status: DC | PRN
  Filled 2018-01-24: qty 5

## 2018-01-24 MED ORDER — OXYCODONE HCL 5 MG PO TABS
5.0000 mg | ORAL_TABLET | ORAL | Status: DC | PRN
  Administered 2018-01-24 – 2018-01-25 (×6): 5 mg via ORAL
  Filled 2018-01-24 (×7): qty 1

## 2018-01-24 MED ORDER — LIDOCAINE-EPINEPHRINE (PF) 2 %-1:200000 IJ SOLN: INTRAMUSCULAR | Status: DC | PRN

## 2018-01-24 MED ORDER — TETANUS-DIPHTH-ACELL PERTUSSIS 5-2.5-18.5 LF-MCG/0.5 IM SUSP: 0.5000 mL | Freq: Once | INTRAMUSCULAR | Status: DC

## 2018-01-24 MED ORDER — OXYTOCIN 10 UNIT/ML IJ SOLN
INTRAMUSCULAR | Status: AC
  Filled 2018-01-24: qty 4

## 2018-01-24 MED ORDER — LACTATED RINGERS IV SOLN
INTRAVENOUS | Status: DC | PRN
  Administered 2018-01-24 (×2): via INTRAVENOUS

## 2018-01-24 MED ORDER — DEXAMETHASONE SODIUM PHOSPHATE 4 MG/ML IJ SOLN
INTRAMUSCULAR | Status: DC | PRN
  Administered 2018-01-24: 4 mg via INTRAVENOUS

## 2018-01-24 MED ORDER — ONDANSETRON HCL 4 MG/2ML IJ SOLN: 4.0000 mg | Freq: Three times a day (TID) | INTRAMUSCULAR | Status: DC | PRN

## 2018-01-24 MED ORDER — SODIUM CHLORIDE 0.9% FLUSH: 3.0000 mL | INTRAVENOUS | Status: DC | PRN

## 2018-01-24 MED ORDER — ENOXAPARIN SODIUM 40 MG/0.4ML ~~LOC~~ SOLN
40.0000 mg | SUBCUTANEOUS | Status: DC
  Administered 2018-01-24 – 2018-01-26 (×3): 40 mg via SUBCUTANEOUS
  Filled 2018-01-24 (×3): qty 0.4

## 2018-01-24 MED ORDER — LACTATED RINGERS IV SOLN
INTRAVENOUS | Status: DC | PRN
  Administered 2018-01-24: 04:00:00 via INTRAVENOUS

## 2018-01-24 MED ORDER — LIDOCAINE-EPINEPHRINE (PF) 2 %-1:200000 IJ SOLN
INTRAMUSCULAR | Status: AC
  Filled 2018-01-24: qty 20

## 2018-01-24 MED ORDER — PHENYLEPHRINE HCL 10 MG/ML IJ SOLN
INTRAMUSCULAR | Status: DC | PRN
  Administered 2018-01-24 (×7): 80 ug via INTRAVENOUS

## 2018-01-24 MED ORDER — SODIUM BICARBONATE 8.4 % IV SOLN
INTRAVENOUS | Status: DC | PRN
  Administered 2018-01-24: 4 mL via EPIDURAL
  Administered 2018-01-24 (×3): 5 mL via EPIDURAL

## 2018-01-24 MED ORDER — ACETAMINOPHEN 325 MG PO TABS
650.0000 mg | ORAL_TABLET | ORAL | Status: DC | PRN
  Administered 2018-01-24 – 2018-01-27 (×11): 650 mg via ORAL
  Filled 2018-01-24 (×13): qty 2

## 2018-01-24 MED ORDER — ONDANSETRON HCL 4 MG/2ML IJ SOLN
INTRAMUSCULAR | Status: DC | PRN
  Administered 2018-01-24: 4 mg via INTRAVENOUS

## 2018-01-24 MED ORDER — WITCH HAZEL-GLYCERIN EX PADS: 1.0000 "application " | MEDICATED_PAD | CUTANEOUS | Status: DC | PRN

## 2018-01-24 MED ORDER — LACTATED RINGERS AMNIOINFUSION
INTRAVENOUS | Status: DC
  Administered 2018-01-24: 02:00:00 via INTRAUTERINE

## 2018-01-24 MED ORDER — FLUTICASONE PROPIONATE 50 MCG/ACT NA SUSP
1.0000 | Freq: Every day | NASAL | Status: DC
  Administered 2018-01-24 – 2018-01-27 (×4): 1 via NASAL
  Filled 2018-01-24: qty 16

## 2018-01-24 MED ORDER — NALOXONE HCL 0.4 MG/ML IJ SOLN: 0.4000 mg | INTRAMUSCULAR | Status: DC | PRN

## 2018-01-24 MED ORDER — SIMETHICONE 80 MG PO CHEW
80.0000 mg | CHEWABLE_TABLET | Freq: Three times a day (TID) | ORAL | Status: DC
  Administered 2018-01-24 – 2018-01-27 (×10): 80 mg via ORAL
  Filled 2018-01-24 (×11): qty 1

## 2018-01-24 MED ORDER — DIPHENHYDRAMINE HCL 25 MG PO CAPS: 25.0000 mg | ORAL_CAPSULE | ORAL | Status: DC | PRN

## 2018-01-24 MED ORDER — ONDANSETRON HCL 4 MG/2ML IJ SOLN
INTRAMUSCULAR | Status: AC
  Filled 2018-01-24: qty 4

## 2018-01-24 MED ORDER — OXYTOCIN 10 UNIT/ML IJ SOLN
INTRAVENOUS | Status: DC | PRN
  Administered 2018-01-24: 40 [IU] via INTRAVENOUS

## 2018-01-24 MED ORDER — SCOPOLAMINE 1 MG/3DAYS TD PT72
1.0000 | MEDICATED_PATCH | Freq: Once | TRANSDERMAL | Status: DC
  Filled 2018-01-24: qty 1

## 2018-01-24 MED ORDER — KETOROLAC TROMETHAMINE 30 MG/ML IJ SOLN
30.0000 mg | Freq: Once | INTRAMUSCULAR | Status: AC | PRN
  Administered 2018-01-24: 30 mg via INTRAVENOUS

## 2018-01-24 MED ORDER — MORPHINE SULFATE (PF) 0.5 MG/ML IJ SOLN
INTRAMUSCULAR | Status: DC | PRN
  Administered 2018-01-24: 3 mg via EPIDURAL

## 2018-01-24 MED ORDER — SODIUM BICARBONATE 8.4 % IV SOLN
INTRAVENOUS | Status: AC
  Filled 2018-01-24: qty 50

## 2018-01-24 MED ORDER — DIPHENHYDRAMINE HCL 25 MG PO CAPS: 25.0000 mg | ORAL_CAPSULE | Freq: Four times a day (QID) | ORAL | Status: DC | PRN

## 2018-01-24 MED ORDER — OXYCODONE HCL 5 MG PO TABS
10.0000 mg | ORAL_TABLET | ORAL | Status: DC | PRN
  Administered 2018-01-26 – 2018-01-27 (×5): 10 mg via ORAL
  Filled 2018-01-24 (×5): qty 2

## 2018-01-24 MED ORDER — MORPHINE SULFATE (PF) 0.5 MG/ML IJ SOLN
INTRAMUSCULAR | Status: AC
  Filled 2018-01-24: qty 10

## 2018-01-24 MED ORDER — PHENYLEPHRINE 40 MCG/ML (10ML) SYRINGE FOR IV PUSH (FOR BLOOD PRESSURE SUPPORT)
PREFILLED_SYRINGE | INTRAVENOUS | Status: AC
  Filled 2018-01-24: qty 20

## 2018-01-24 MED ORDER — SODIUM BICARBONATE 8.4 % IV SOLN
INTRAVENOUS | Status: DC | PRN
  Administered 2018-01-24 (×4): 5 mL via EPIDURAL

## 2018-01-24 MED ORDER — OXYCODONE HCL 5 MG/5ML PO SOLN: 5.0000 mg | Freq: Once | ORAL | Status: DC | PRN

## 2018-01-24 MED ORDER — SCOPOLAMINE 1 MG/3DAYS TD PT72
MEDICATED_PATCH | TRANSDERMAL | Status: DC | PRN
  Administered 2018-01-24: 1 via TRANSDERMAL

## 2018-01-24 MED ORDER — NALBUPHINE HCL 10 MG/ML IJ SOLN: 5.0000 mg | INTRAMUSCULAR | Status: DC | PRN

## 2018-01-24 SURGICAL SUPPLY — 36 items
APL SKNCLS STERI-STRIP NONHPOA (GAUZE/BANDAGES/DRESSINGS) ×1
BENZOIN TINCTURE PRP APPL 2/3 (GAUZE/BANDAGES/DRESSINGS) ×2 IMPLANT
CHLORAPREP W/TINT 26ML (MISCELLANEOUS) ×3 IMPLANT
CLAMP CORD UMBIL (MISCELLANEOUS) IMPLANT
CLOSURE WOUND 1/2 X4 (GAUZE/BANDAGES/DRESSINGS) ×1
CLOTH BEACON ORANGE TIMEOUT ST (SAFETY) ×3 IMPLANT
DRSG OPSITE POSTOP 4X10 (GAUZE/BANDAGES/DRESSINGS) ×3 IMPLANT
ELECT REM PT RETURN 9FT ADLT (ELECTROSURGICAL) ×3
ELECTRODE REM PT RTRN 9FT ADLT (ELECTROSURGICAL) ×1 IMPLANT
EXTRACTOR VACUUM BELL STYLE (SUCTIONS) IMPLANT
GLOVE BIOGEL PI IND STRL 6.5 (GLOVE) ×1 IMPLANT
GLOVE BIOGEL PI IND STRL 7.0 (GLOVE) ×2 IMPLANT
GLOVE BIOGEL PI INDICATOR 6.5 (GLOVE) ×2
GLOVE BIOGEL PI INDICATOR 7.0 (GLOVE) ×4
GLOVE ORTHOPEDIC STR SZ6.5 (GLOVE) ×3 IMPLANT
GOWN STRL REUS W/TWL LRG LVL3 (GOWN DISPOSABLE) ×9 IMPLANT
HEMOSTAT ARISTA ABSORB 3G PWDR (MISCELLANEOUS) ×2 IMPLANT
KIT ABG SYR 3ML LUER SLIP (SYRINGE) IMPLANT
NDL HYPO 25X1 1.5 SAFETY (NEEDLE) IMPLANT
NEEDLE HYPO 22GX1.5 SAFETY (NEEDLE) ×3 IMPLANT
NEEDLE HYPO 25X1 1.5 SAFETY (NEEDLE) IMPLANT
NS IRRIG 1000ML POUR BTL (IV SOLUTION) ×3 IMPLANT
PACK C SECTION WH (CUSTOM PROCEDURE TRAY) ×3 IMPLANT
PAD OB MATERNITY 4.3X12.25 (PERSONAL CARE ITEMS) ×3 IMPLANT
PENCIL SMOKE EVAC W/HOLSTER (ELECTROSURGICAL) ×3 IMPLANT
SPONGE LAP 18X18 RF (DISPOSABLE) ×9 IMPLANT
STRIP CLOSURE SKIN 1/2X4 (GAUZE/BANDAGES/DRESSINGS) ×1 IMPLANT
SUT MON AB 4-0 PS1 27 (SUTURE) ×3 IMPLANT
SUT PLAIN 2 0 (SUTURE) ×3
SUT PLAIN ABS 2-0 CT1 27XMFL (SUTURE) ×1 IMPLANT
SUT VIC AB 0 CT1 36 (SUTURE) ×6 IMPLANT
SUT VIC AB 0 CTX 36 (SUTURE) ×3
SUT VIC AB 0 CTX36XBRD ANBCTRL (SUTURE) ×1 IMPLANT
SYR CONTROL 10ML LL (SYRINGE) ×3 IMPLANT
TOWEL OR 17X24 6PK STRL BLUE (TOWEL DISPOSABLE) ×3 IMPLANT
TRAY FOLEY W/BAG SLVR 14FR LF (SET/KITS/TRAYS/PACK) ×3 IMPLANT

## 2018-01-24 NOTE — Addendum Note (Signed)
Addendum  created 01/24/18 0831 by Graciela Husbands, CRNA   Sign clinical note

## 2018-01-24 NOTE — Progress Notes (Signed)
[name] was referred for history of depression/anxiety. * Referral screened out by Clinical Social Worker because none of the following criteria appear to apply: ~ History of anxiety/depression during this pregnancy, or of post-partum depression following prior delivery. ~ Diagnosis of anxiety and/or depression within last 3 years OR * [name]'s symptoms currently being treated with medication and/or therapy. Please contact the Clinical Social Worker if needs arise, by [name] request, or if [name] scores greater than 9/yes to question 10 on Edinburgh Postpartum Depression Screen.  Deforrest Bogle, LCSW Clinical Social Worker  [facility]  [phone]  

## 2018-01-24 NOTE — Anesthesia Procedure Notes (Signed)
Epidural Patient location during procedure: OB Start time: 01/24/2018 12:01 AM End time: 01/24/2018 12:15 AM  Staffing Anesthesiologist: Lowella Curb, MD Performed: anesthesiologist   Preanesthetic Checklist Completed: patient identified, site marked, surgical consent, pre-op evaluation, timeout performed, IV checked, risks and benefits discussed and monitors and equipment checked  Epidural Patient position: sitting Prep: ChloraPrep Patient monitoring: heart rate, cardiac monitor, continuous pulse ox and blood pressure Approach: midline Location: L2-L3 Injection technique: LOR saline  Needle:  Needle type: Tuohy  Needle gauge: 17 G Needle length: 9 cm Needle insertion depth: 6 cm Catheter type: closed end flexible Catheter size: 20 Guage Catheter at skin depth: 10 cm Test dose: negative  Assessment Events: blood not aspirated, injection not painful, no injection resistance, negative IV test and no paresthesia  Additional Notes Reason for block:procedure for pain

## 2018-01-24 NOTE — Lactation Note (Signed)
This note was copied from a baby's chart. Lactation Consultation Note; Initial visit. Baby now 5 hours old, born at 35.3 [name]. Mom has attempted to latch but she was sleepy and did not nurse. Has been giving bottles of formula. Assisted with first pumping. Reviewed setup, use and cleaning of pump pieces. Did not obtain any Colostrum. Encouragement given. Reviewed hand expression. Has lump in right breast in outer region. Reports it has been there throughout pregnancy and OB is aware. Reviewed LPTI care. BF brochure given- reviewed our phone number to call with questions after DC. Has WIC in [address]. I will send referral to them for pump for home. No questions at present. Mom is sleepy and will need review. To call for assist prn  Patient Name: [name] [id number] Date: 01/24/2018 Reason for consult: Initial assessment;Late-preterm 34-36.6wks   Maternal Data Formula Feeding for Exclusion: No Has patient been taught Hand Expression?: Yes Does the patient have breastfeeding experience prior to this delivery?: Yes  Feeding Feeding Type: Formula Nipple Type: Slow - flow  LATCH Score                   Interventions Interventions: Hand express;DEBP  Lactation Tools Discussed/Used Tools: Pump Breast pump type: Double-Electric Breast Pump WIC Program: Yes Pump Review: Setup, frequency, and cleaning Initiated by:: DW Date initiated:: 01/24/18   Consult Status Consult Status: Follow-up Date: 01/25/18 Follow-up type: In-patient    Pamelia Hoit 01/24/2018, 9:16 AM

## 2018-01-24 NOTE — Progress Notes (Signed)
RN called to come evaluate patient as prolonged deceleration after placement of epidural. On arrival to room, patient was lying flat with oxygen in placed and fetal heart rate in 90s. BP hypotensive, dose of phenylephrine given. HR improved to 120-130s lying on back/left side. SVE had changed to about 6cm. Will continue to monitor.

## 2018-01-24 NOTE — Anesthesia Postprocedure Evaluation (Signed)
Anesthesia Post Note  Patient: [name] Moreno  Procedure(s) Performed: CESAREAN SECTION (N/A )     Patient location during evaluation: Mother Baby Anesthesia Type: Epidural Level of consciousness: awake and alert and oriented Pain management: satisfactory to patient Vital Signs Assessment: post-procedure vital signs reviewed and stable Respiratory status: respiratory function stable Cardiovascular status: stable Postop Assessment: no headache, no backache, epidural receding, patient able to bend at knees, no signs of nausea or vomiting and adequate PO intake Anesthetic complications: no    Last Vitals:  Vitals:   01/24/18 0612 01/24/18 0648  BP:  128/82  Pulse: 96 94  Resp: 20 18  Temp:  36.7 C  SpO2: 97% 100%    Last Pain:  Vitals:   01/24/18 0648  TempSrc: Oral  PainSc: 0-No pain   Pain Goal: Patients Stated Pain Goal: 2 (01/22/18 1935)               Karleen Dolphin

## 2018-01-24 NOTE — Progress Notes (Signed)
Subjective: POD#1. Cesarean Delivery Patient reports some pain. SCDs, catheter still in place. Has not been able to sleep yet.   Objective: Vital signs in last 24 hours: Temp:  [98 F (36.7 C)-99 F (37.2 C)] 98 F (36.7 C) (10/08 0800) Pulse Rate:  [85-145] 94 (10/08 0800) Resp:  [14-31] 18 (10/08 0800) BP: (99-151)/(44-99) 127/70 (10/08 0800) SpO2:  [94 %-100 %] 99 % (10/08 0800)  Physical Exam:  General: well-appearing, NAD Lochia: appropriate Uterine Fundus: firm Incision: pressure dressing in place, no bleeding noted on dressing DVT Evaluation: SCDs in place  Recent Labs    01/23/18 2253 01/24/18 0658  HGB 9.4* 8.9*  HCT 28.7* 27.5*    Assessment/Plan: Status post Cesarean section. Doing well postoperatively.  Continue current care. HgB stable this morning 9.1>8.9. A1GDM - will follow-up FBG tomorrow   Tamera Stands, DO  01/24/2018, 8:45 AM

## 2018-01-24 NOTE — Anesthesia Postprocedure Evaluation (Signed)
Anesthesia Post Note  Patient: [name] Moreno  Procedure(s) Performed: CESAREAN SECTION (N/A )     Patient location during evaluation: Mother Baby Anesthesia Type: Epidural Level of consciousness: awake and alert Pain management: pain level controlled Vital Signs Assessment: post-procedure vital signs reviewed and stable Respiratory status: spontaneous breathing, nonlabored ventilation and respiratory function stable Cardiovascular status: stable Postop Assessment: no headache, no backache and epidural receding Anesthetic complications: no    Last Vitals:  Vitals:   01/24/18 0611 01/24/18 0612  BP:    Pulse: (!) 101 96  Resp: (!) 21 20  Temp:    SpO2: 97% 97%    Last Pain:  Vitals:   01/24/18 0600  TempSrc:   PainSc: 9    Pain Goal: Patients Stated Pain Goal: 2 (01/22/18 1935)               Lowella Curb

## 2018-01-24 NOTE — Progress Notes (Signed)
OB/GYN Faculty Practice: Labor Progress Note  Subjective: Shivering, feeling pressure like she needs to poop.   Objective: BP (!) 111/54   Pulse (!) 113   Temp 98.5 F (36.9 C) (Oral)   Resp 20   Ht 5\' 3"  (1.6 m)   Wt 88.6 kg   LMP 05/21/2017 (Exact Date)   SpO2 95%   BMI 34.59 kg/m  Gen: shaking, wrapped in blankets  Dilation: 6 Effacement (%): 90 Cervical Position: Middle Station: -1 Presentation: Vertex Exam by:: Valeta Harms RN  Assessment and Plan: 37 y.o. [id number] [redacted]w[redacted]d here for augmentation of labor for term vaginal bleeding with suspected abruption and PPROM.  Labor: Expectant management. PPROM at 1830, started contracting more regularly at that time. Given persistent variable decelerations, placed IUPC with amnioinfusion starting around 0200. Bright red blood on sheet with placement of IUPC.  -- pain control: epidural in place -- PPH Risk: high - vaginal bleeding, amnioinfusion   Fetal Well-Being: EFW 1588g (48%) at 30w2. Cephalic by sutures.  -- NICU aware  -- Category II - continuous fetal monitoring - trial of position changes, started amnioinfusion for deep variables  -- GBS positive    [name] Noland S. Earlene Plater, DO OB/GYN Fellow, Faculty Practice  1:50 AM

## 2018-01-24 NOTE — Transfer of Care (Signed)
Immediate Anesthesia Transfer of Care Note  Patient: [name] Moreno  Procedure(s) Performed: CESAREAN SECTION (N/A )  Patient Location: PACU  Anesthesia Type:Epidural  Level of Consciousness: awake, alert  and oriented  Airway & Oxygen Therapy: Patient Spontanous Breathing  Post-op Assessment: Report given to RN and Post -op Vital signs reviewed and stable  Post vital signs: Reviewed and stable   HR 97 BP 108/63 Sats 97% resp 22   Last Vitals:  Vitals Value Taken Time  BP 153/132 01/24/2018  4:53 AM  Temp    Pulse 103 01/24/2018  4:58 AM  Resp 20 01/24/2018  4:58 AM  SpO2 98 % 01/24/2018  4:58 AM  Vitals shown include unvalidated device data.  Last Pain:  Vitals:   01/24/18 0253  TempSrc: Oral  PainSc:       Patients Stated Pain Goal: 2 (01/22/18 1935)  Complications: No apparent anesthesia complications

## 2018-01-24 NOTE — Op Note (Signed)
[name] Moreno PROCEDURE DATE: 01/24/2018  PREOPERATIVE DIAGNOSES: Intrauterine pregnancy at [redacted]w[redacted]d weeks gestation; non-reassuring fetal status  POSTOPERATIVE DIAGNOSES: The same, placental abruption  PROCEDURE: Primary Low Transverse Cesarean Section  SURGEON:  Baldemar Lenis, MD  ASSISTANT:  Rhett Bannister, DO An experienced assistant was required given the standard of surgical care given the complexity of the case.  This assistant was needed for exposure, dissection, suctioning, retraction, instrument exchange, assisting with delivery with administration of fundal pressure, and for overall help during the procedure.  ANESTHESIOLOGY TEAM: Anesthesiologist: Lowella Curb, MD CRNA: Jennelle Human, CRNA; Rhymer, Doree Fudge, CRNA  INDICATIONS: [name] Moreno is a 37 y.o. [phone] at [redacted]w[redacted]d here for cesarean section secondary to the indications listed under preoperative diagnoses; please see preoperative note for further details.  The risks of cesarean section were discussed with the patient including but were not limited to: bleeding which may require transfusion or reoperation; infection which may require antibiotics; injury to bowel, bladder, ureters or other surrounding organs; injury to the fetus; need for additional procedures including hysterectomy in the event of a life-threatening hemorrhage; placental abnormalities wth subsequent pregnancies, incisional problems, thromboembolic phenomenon and other postoperative/anesthesia complications.  She consents to blood transfusion in the event of an emergency. The patient verbalized understanding of the plan, giving informed written consent for the procedure.    FINDINGS:  Viable female infant in cephalic presentation, tight nuchal cord x1.  Apgars 8 and 9.  Clear amniotic fluid.  Placental abruption noted with dark clot in placenta, three vessel cord.  Normal uterus, fallopian tubes and ovaries bilaterally.  ANESTHESIA: Epidural   INTRAVENOUS FLUIDS: 1000 ml   ESTIMATED BLOOD LOSS: 700 ml URINE OUTPUT:  200 ml SPECIMENS: Placenta sent to pathology COMPLICATIONS: None immediate  PROCEDURE IN DETAIL:  The patient preoperatively received intravenous antibiotics and had sequential compression devices applied to her lower extremities.  She was then taken to the operating room where the epidural anesthesia was dosed up to surgical level and was found to be adequate. She was then placed in a dorsal supine position with a leftward tilt, and prepped and draped in a sterile manner.  A foley catheter was placed into her bladder with sterile technique and attached to constant gravity.  After a timeout was performed, a Pfannenstiel skin incision was made with scalpel and carried through to the underlying layer of fascia. The fascia was incised in the midline, and this incision was extended bilaterally using the Mayo scissors.  Kocher clamps were applied to the superior aspect of the fascial incision and the underlying rectus muscles were dissected off bluntly. The rectus muscles were separated in the midline bluntly and the peritoneum was entered bluntly. The peritoneal incision was carefully extended bluntly laterally and caudad with good visualization of the bladder. The uterus appeared normal. The bladder blade was inserted. Attention was turned to the lower uterine segment where a low transverse hysterotomy was made with a scalpel and extended bilaterally bluntly.  The infant was delivered from ROP position, nose and mouth were bulb suctioned, and the cord clamped and cut after 1 minute. The infant was then handed over to the waiting neonatology team. Uterine massage was then performed, and the placenta delivered intact with a three-vessel cord, abruption noted with dark clot at the time of delivery of placenta. The uterus was then gently exteriorized and cleared of clots and debris.  The hysterotomy was closed with 0 Vicryl in a running  locked fashion, and an imbricating layer  was also placed with 0 Vicryl. Figure-of-eight 0 Vicryl serosal stitches were placed to help with hemostasis.  The fallopian tubes and ovaries were visualized bilaterally and normal appearing. The uterus was then gently replaced within the abdomen.   The pelvis was cleared of all clot and debris. Arista was placed over the hysterotomy for additional hemostasis.  Hemostasis was again confirmed on all surfaces. The peritoneum was re-approximated using 2-0 Vicryl suture. The fascia was then closed using 0 Vicryl in a running fashion.  The subcutaneous layer was irrigated, then reapproximated with 2-0 plain gut.  The skin was closed with a 4-0 Monocryl subcuticular stitch. The patient tolerated the procedure well. Sponge, lap, instrument and needle counts were correct x 3.  She was taken to the recovery room in stable condition.    Baldemar Lenis, M.D. Attending Obstetrician & Gynecologist, [facility] for [employer], [facility]

## 2018-01-24 NOTE — Progress Notes (Signed)
Faculty Note - Late Entry  In to room for decels, patient comfortable with epidural. Decels improved on hands/knees. Cervix remained unchanged at 7/90/-1. Reviewed that with no cervical change for 2 hours and repetitive variable decels, I have concerns infant won't tolerate labor. She verbalized understanding, shortly thereafter patient had several large variable decels back to back with minimal recovery time. I recommended we proceed with stat c-section. Reviewed risks of c-section including risk of infection, bleeding, damage to surrounding tissue and organs, possible need for additional procedures, possible need for c-section with future pregnancies. She verbalized understanding and consents to proceeding with primary c-section. Consent signed, patient agreeable to blood transfusion in event of emergency.   To OR stat for primary c-section   K. Therese Sarah, M.D. [facility] [employer] [facility]

## 2018-01-25 ENCOUNTER — Encounter (HOSPITAL_COMMUNITY): Payer: Self-pay | Admitting: Obstetrics and Gynecology

## 2018-01-25 LAB — GLUCOSE, CAPILLARY: GLUCOSE-CAPILLARY: 87 mg/dL (ref 70–99)

## 2018-01-25 NOTE — Progress Notes (Signed)
Subjective: Postpartum Day 1: Cesarean Delivery Patient reports incisional pain, tolerating PO and + flatus.  She reports that incision is painful when ambulating or moving around in bed. She reports that her vaginal bleeding is "very light". She reports itching on her back. Denies HA, vision change, and RUQ pain. She is breast and bottle feeding and requests IUD for birth control.   Objective: Vital signs in last 24 hours: Temp:  [97.7 F (36.5 C)-98.4 F (36.9 C)] 98.1 F (36.7 C) (10/09 0600) Pulse Rate:  [69-95] 69 (10/09 0600) Resp:  [16-18] 18 (10/09 0600) BP: (97-134)/(43-78) 106/59 (10/09 0600) SpO2:  [97 %-99 %] 97 % (10/09 0600)  Physical Exam:  General: alert and cooperative  Heart: regular rate and rhythm without murmurs noted Lungs: Clear to auscultation bilaterally Lochia: appropriate Uterine Fundus: firm, palpable, pain was elicited on palpation of the abdomen.  Incision: Dressing still intact and is unsaturated.  DVT Evaluation: Negative Homan's sign. No tenderness to palpation of calf and ankle bilaterally. No significant calf/ankle edema.  Recent Labs    01/23/18 2253 01/24/18 0658  HGB 9.4* 8.9*  HCT 28.7* 27.5*  10/9 FBG at 0617: 87   Assessment/Plan: Status post Cesarean section. Doing well postoperatively. Patient is tolerating PO intake and ambulation. Continue current care. Continue to monitor for increased pain or bleeding.   Charyl Dancer 01/25/2018, 7:34 AM  Patient see in conjunction with Charyl Dancer, PA-S  Doing well - having some pain, but overly controlled with pain medicine. Tolerating PO. No fevers, chills, nausea, vomiting.  BP (!) 106/59 (BP Location: Left Arm)   Pulse 69   Temp 98.1 F (36.7 C) (Oral)   Resp 18   Ht 5\' 3"  (1.6 m)   Wt 88.6 kg   LMP 05/21/2017 (Exact Date)   SpO2 97%   Breastfeeding? Unknown   BMI 34.59 kg/m  A&Ox3, NAD NOnlabored breathing Firm fundus with normal lochia No evidence of DVT.  A/P Postop day  1 Continue routine postop care Lactation support.  Levie Heritage, DO 01/25/2018 7:55 AM

## 2018-01-25 NOTE — Addendum Note (Signed)
Addendum  created 01/25/18 0924 by Lowella Curb, MD   Intraprocedure Event edited, Intraprocedure Staff edited

## 2018-01-25 NOTE — Lactation Note (Signed)
This note was copied from a baby's chart. Lactation Consultation Note  Patient Name: [name] Moreno ZOXWR'U Date: 01/25/2018     [facility] Follow Up Visit:  Mother desires to rest at this time; will return later today for another attempt to visit with her.               [name] Moreno R Bionca Mckey 01/25/2018, 1:25 PM

## 2018-01-26 NOTE — Lactation Note (Signed)
This note was copied from a baby's chart. Lactation Consultation Note  Patient Name: [name] Moreno [id number] Date: 01/26/2018 Reason for consult: Follow-up assessment;Late-preterm 34-36.6wks;Infant < 6lbs;Difficult latch  P2 mother whose infant is now 75 hours old.  This is a LPTI at 35+3 weeks weighing < 6 lbs  Baby swaddled and visitor holding baby when I entered the room.  Mother stated that her breasts are hurting.  Upon assessment, mother's breasts are engorged.  She stated that she has been pumping but "nothing is coming out."  I observed the set up of her DEBP and the junctures were open causing little suction.  Mother had been pumping without suction and had the settings turned down to "2" milk drops in window on pump.    Her breasts were hard and painful.  She had swelling to the nipple/areloar complex.  Her right breast was more firm and knotty as compared to the left breast.  I massaged both breasts and started the DEBP immediately after massaging the breasts.  Mother was able to obtain 1 ml of EBM from the right breast but no milk expressed from the left breast.  After pumping I massaged again pushing fluid away from the areola and applied ice packs to both breasts.  Mother will use these packs for 15-20 minutes now and again before sleep tonight.    I encouraged her to continue doing hand expression before/after feedings.  She will continue to pump every 3 hours and attempt to latch baby on at every feeding.  Mother will call for assistance as needed.  I encouraged her to continue STS to help baby acclimate to the breast and begin to learn how to latch.  I reminded her that it is okay if she is not successful with baby.  The idea is to continue to allow baby to nuzzle and learn how to latch; it is a process that takes time and practice.  Mother verbalized understanding.  I am not really sure how dedicated she is to continuing this once she leaves the hospital.  She stated that she  wants to breast/bottle feed.  She did verbalize that she knows the importance of her breast milk for baby.    I offered to feed baby for mother to demonstrate paced bottle feeding and she accepted.  She was not familiar with this and was very interested in learning.  Baby nippled 33 mls easily and burped well.  No emesis noted.  I encouraged plenty of "down time" for baby in between feedings and reviewed LPTI guidelines.  Mother was interested in discussion.    Mother is expecting father to arrive around 2000 after work and she did have a support person with her.  She will call for any questions/concerns she may have with feeding and her engorgement.  RN updated.   Maternal Data Formula Feeding for Exclusion: No Has patient been taught Hand Expression?: Yes  Feeding    LATCH Score                   Interventions    Lactation Tools Discussed/Used Tools: Pump Breast pump type: Double-Electric Breast Pump Initiated by:: Already set up in room   Consult Status Consult Status: Follow-up Date: 01/27/18 Follow-up type: In-patient    Dora Sims 01/26/2018, 6:34 PM

## 2018-01-26 NOTE — Progress Notes (Signed)
Subjective: Postpartum Day 2: Cesarean Delivery Patient reports incisional pain and tolerating PO.   Having some dizziness at times when walking.  Wants to stay one more day to get stronger  Objective: Vital signs in last 24 hours: Temp:  [98.6 F (37 C)-98.7 F (37.1 C)] 98.7 F (37.1 C) (10/10 0603) Pulse Rate:  [82-87] 82 (10/10 0603) Resp:  [18] 18 (10/10 0603) BP: (100-118)/(62-68) 100/62 (10/10 0603) SpO2:  [100 %] 100 % (10/10 0603)  Physical Exam:  General: alert, cooperative and no distress Lochia: appropriate Uterine Fundus: firm Incision: no significant drainage   Old drainage on bottom of honeycomb DVT Evaluation: No evidence of DVT seen on physical exam.  Recent Labs    01/23/18 2253 01/24/18 0658  HGB 9.4* 8.9*  HCT 28.7* 27.5*    Assessment/Plan: Status post Cesarean section. Doing well postoperatively.  Continue current care Plan discharge tomorrow.  [name] Moreno 01/26/2018, 6:53 AM

## 2018-01-26 NOTE — Lactation Note (Addendum)
This note was copied from a baby's chart. Lactation Consultation Note  Patient Name: [name] Moreno [id number] Date: 01/26/2018   [facility] Follow Up Visit:  Mother had a "Do Not Disturb" sign on her door.  I verified with the RN and she will notify me when mother awakens.  RN does not feel like mother is going to breast feed her baby.  Suggested she remind mother to increase volume of formula given and she has done this.                    Hever Castilleja R Sofia Vanmeter 01/26/2018, 2:40 PM

## 2018-01-27 ENCOUNTER — Ambulatory Visit: Payer: Self-pay

## 2018-01-27 DIAGNOSIS — Z98891 History of uterine scar from previous surgery: Secondary | ICD-10-CM

## 2018-01-27 MED ORDER — BUSPIRONE HCL 10 MG PO TABS: 20.0000 mg | ORAL_TABLET | Freq: Three times a day (TID) | ORAL | 1 refills | Status: DC

## 2018-01-27 MED ORDER — FERROUS SULFATE 325 (65 FE) MG PO TABS: 325.0000 mg | ORAL_TABLET | Freq: Every day | ORAL | 3 refills | Status: DC

## 2018-01-27 MED ORDER — OXYCODONE HCL 10 MG PO TABS: 10.0000 mg | ORAL_TABLET | ORAL | 0 refills | Status: DC | PRN

## 2018-01-27 MED ORDER — HYDROXYZINE PAMOATE 25 MG PO CAPS: 25.0000 mg | ORAL_CAPSULE | Freq: Every evening | ORAL | 0 refills | Status: DC | PRN

## 2018-01-27 NOTE — Discharge Summary (Signed)
Postpartum Discharge Summary     Patient Name: [name] Moreno DOB: Mar 20, 1981 MRN: [id number]  Date of admission: 01/18/2018 Delivering Provider: Conan Bowens   Date of discharge: 01/27/2018  Admitting diagnosis: 34WKS BLEEDING Intrauterine pregnancy: [redacted]w[redacted]d     Secondary diagnosis:  Principal Problem:   Placental abruption in third trimester Active Problems:   Supervision of high-risk pregnancy   Third trimester bleeding, antepartum   Previous preterm delivery at 36 weeks, antepartum   Gestational diabetes mellitus in third trimester   GBS bacteriuria   Preterm labor in third trimester   Anemia affecting pregnancy in third trimester   Preterm premature rupture of membranes (PPROM) with unknown onset of labor  Additional problems: PPH, GDMA1     Discharge diagnosis: Term Pregnancy Delivered                                                                                                Post partum procedures:None  Augmentation: None  Complications: Postpartum Hemorrhage 700 EBL  Hospital course:  Onset of Labor With Unplanned C/S    Magnesium Sulfate recieved: No BMZ received: No  Physical exam  Vitals:   01/25/18 2310 01/26/18 0603 01/26/18 2303 01/27/18 0603  BP: 118/68 100/62 128/73 132/78  Pulse: 87 82 87 77  Resp: 18 18 16 16   Temp: 98.6 F (37 C) 98.7 F (37.1 C) 98.9 F (37.2 C) 98.1 F (36.7 C)  TempSrc: Axillary Oral Oral Oral  SpO2: 100% 100%  98%  Weight:      Height:       General: alert, cooperative and no distress Lochia: appropriate Uterine Fundus: firm Incision: N/A DVT Evaluation: No evidence of DVT seen on physical exam. Labs: Lab Results  Component Value Date   WBC 13.1 (H) 01/24/2018   HGB 8.9 (L) 01/24/2018   HCT 27.5 (L) 01/24/2018   MCV 75.1 (L) 01/24/2018   PLT 152 01/24/2018   CMP Latest Ref Rng & Units 01/11/2018  Glucose 70 - 99 mg/dL 409(W)  BUN 6 - 20 mg/dL 9  Creatinine 1.19 - 1.47 mg/dL 8.29  Sodium 562 - 130  mmol/L 135  Potassium 3.5 - 5.1 mmol/L 3.4(L)  Chloride 98 - 111 mmol/L 106  CO2 22 - 32 mmol/L 20(L)  Calcium 8.9 - 10.3 mg/dL 8.3(L)  Total Protein 6.5 - 8.1 g/dL 6.4(L)  Total Bilirubin 0.3 - 1.2 mg/dL 0.7  Alkaline Phos 38 - 126 U/L 74  AST 15 - 41 U/L 19  ALT 0 - 44 U/L 16    Discharge instruction: per After Visit Summary and "Baby and Me Booklet".  After visit meds:  Allergies as of 01/27/2018   No Known Allergies     Medication List    STOP taking these medications   ACCU-CHEK FASTCLIX LANCETS Misc   diphenhydrAMINE 25 MG tablet Commonly known as:  BENADRYL   glucose blood test strip   PREPLUS 27-1 MG Tabs   promethazine 25 MG tablet Commonly known as:  PHENERGAN     TAKE these medications   Doxylamine-Pyridoxine ER 20-20 MG Tbcr Take 1 tablet by  mouth at bedtime. May add 1 tab midday if needed   famotidine 20 MG tablet Commonly known as:  PEPCID Take 1 tablet (20 mg total) by mouth 2 (two) times daily.   fluticasone 50 MCG/ACT nasal spray Commonly known as:  FLONASE Place 1 spray into both nostrils daily as needed for allergies or rhinitis.   Oxycodone HCl 10 MG Tabs Take 1 tablet (10 mg total) by mouth every 4 (four) hours as needed (pain scale > 7).       Diet: routine diet  Activity: Advance as tolerated. Pelvic rest for 6 weeks.   Outpatient follow up:2 weeks for c-section check and PP visit in 6 weeks.  Follow up Appt: Future Appointments  Date Time Provider Department Center  02/07/2018 10:00 AM [facility] NURSE [facility]  02/22/2018 10:00 AM Federico Flake, MD [facility]  03/07/2018  8:15 AM [facility] LAB [facility]   Follow up Visit:   Please schedule this patient for Postpartum visit in: 6 weeks with the following provider: Any provider For C/S patients schedule nurse incision check in weeks 2 weeks: yes High risk pregnancy complicated by: GDM Delivery mode:  CS Anticipated Birth Control:   Depo PP Procedures needed: 2 hour GTT  Schedule Integrated BH visit: no      Newborn Data: Live born female  Birth Weight: 5 lb 6.8 oz (2460 g) APGAR: 8, 9  Newborn Delivery   Birth date/time:  01/24/2018 03:36:00 Delivery type:  C-Section, Low Transverse Trial of labor:  Yes C-section categorization:  Primary     Baby Feeding: Breast Disposition:home with mother   01/27/2018 Marylene Land, CNM

## 2018-01-27 NOTE — Lactation Note (Signed)
This note was copied from a baby's chart. Lactation Consultation Note  Patient Name: [name] Moreno NWGNF'[id number] Date: 01/27/2018 Reason for consult: Follow-up assessment  Asked by RN to assist and assess breast engorgement.  Both breasts full, right breast engorged.  Mom very uncomfortable.  RN states Mom has been icing and pumping on and off all day.  Mom has been lying down in bed with ice packs.   Pumping for only 3-5 ml. Recommended heat and massage for 10 mins and then Mom to double pump.  Then to keep ice packs on breasts for 20-30 mins between pumping.   Mom asked to wait for her company to leave before doing the warm massage.  Ice packs filled and given to place on both breasts.   Recommended she increase frequency of double pumping.  To use warm massage during pumping as well. RN aware of plan.  Mom to call prn for assistance.  Consult Status Consult Status: Follow-up Date: 01/27/18 Follow-up type: In-patient    [name] Moreno 01/27/2018, 6:55 PM

## 2018-01-27 NOTE — Lactation Note (Signed)
This note was copied from a baby's chart. Lactation Consultation Note  Patient Name: [name] Moreno ZOXWR'U Date: 01/27/2018 Reason for consult: Follow-up assessment;Late-preterm 34-36.6wks;Hyperbilirubinemia Engorgement improving with ice and pumping.  Mom pumped 30 mls last pumping.  Instructed to ice x 30 minutes every 2 hours and pump every 2-2 1/2 hours.  Mom would like assist later with latching baby to breast.  Instructed to call for assist.  Maternal Data Formula Feeding for Exclusion: No  Feeding Feeding Type: Bottle Fed - Formula Nipple Type: Regular  LATCH Score                   Interventions    Lactation Tools Discussed/Used     Consult Status Consult Status: Follow-up Date: 01/28/18 Follow-up type: In-patient    Huston Foley 01/27/2018, 9:27 AM

## 2018-01-27 NOTE — Lactation Note (Signed)
This note was copied from a baby's chart. Lactation Consultation Note  Patient Name: [name] Moreno ZOXWR'U Date: 01/27/2018 Reason for consult: Follow-up assessment;Late-preterm 34-36.6wks P2. 91 hours, LPTI  BF concerns:  Infant  is a baby now 53 hours  old . Mom w/ on-going engorgement, infant LPTI receiving EBM  30 ml of breast milk and supplemented with 22 kcal Similac Neosure with iron.  LC entered room mom w/ ice on both breast and breast are extremely hard mom in pain she felt heat made it worst with more swelling. LC put ice and started w/ hand pump and breast massage mom expressed 30 ml of breast milk out of left breast and fitted with 30 mm breast flange due breast swelling. Mom was fitted with  36 flange on left breast and still pumping as LC left room. Mom advised to completely empty breast with this pumping session. Mom will use comfort gels after pumping to help with breast tenderness and soreness . Mom will call LC if she has any further questions or concerns or need further assistance from [facility].  Mom's plan: 1. Mom will  use DEBP pump every 3 hours or less if she feels breast are getting full or uncomfortable. 2. Use ice on breast and wear comfort gels after pumping to help alleviate  engorgement pain.  3. Will continue give infant EBM first, then supplement with formula.  4. Mom plans to continue  w/ ibuprofen as she been  prescribe for pain  .   Maternal Data    Feeding    LATCH Score                   Interventions    Lactation Tools Discussed/Used     Consult Status      [name] Moreno 01/27/2018, 11:01 PM

## 2018-01-27 NOTE — Progress Notes (Signed)
Patient continues to state pain a 9/10 or a 10/10.  Upon assessment all day 10/10 7a-7p and today 10/11, patient is comfortably laying in the bed on the phone or sleeping.  Will continue to monitor and give pain medication and heat packs for an alternative intervention.

## 2018-01-27 NOTE — Lactation Note (Signed)
This note was copied from a baby's chart. Lactation Consultation Note  Patient Name: [name] Moreno ZOXWR'U Date: 01/27/2018 Reason for consult: Follow-up assessment;Mother's request;1st time breastfeeding;Late-preterm 34-36.6wks P2, 75 hours, LPTI BF concerns: LPTI and engorgment LC entered room mom is on side on bed , both breast are engorged and infant in nursery on bili lights. Per mom,  She pumped 3 times during the night and used ice,  but mom feels DEBP was not strong enough to relieve her  engorgement. LC used cylinder hand pump, reverse pressure softening and ice. Mom expressed 40 ml from left breast and had volume on right breast which is still engorged  .  LC changed setting on DEBP and mom started flowing on left breast and using ice as she is pumping.  Mom fitted 27 flange on left breast and 24 flange on right breast by LC. Mom will call LC or nurse if she has any further questions or concerns or if she needs help with latching infant to breast.  Mom's goals: 1. Once engorgement is resolved mom plans to work towards latching infant to breast.  2. Mom wants to give more EBM and maybe less formula to infant. 3. Mom will use DEBP every 3 hours to pump to alleviate engorgement.  Maternal Data Formula Feeding for Exclusion: No  Feeding Feeding Type: Bottle Fed - Formula Nipple Type: Regular  LATCH Score                   Interventions    Lactation Tools Discussed/Used     Consult Status      [name] Moreno 01/27/2018, 7:03 AM

## 2018-01-28 ENCOUNTER — Ambulatory Visit: Payer: Self-pay

## 2018-01-28 NOTE — Lactation Note (Signed)
This note was copied from a baby's chart. Lactation Consultation Note  Patient Name: [name] Moreno ZOXWR'U Date: 01/28/2018 Reason for consult: Follow-up assessment;Late-preterm 34-36.6wks;Infant < 6lbs Mom has intermittent engorgement.  She recently pumped 40 mls.  Left breast is soft and right breast has firm areas in outer quadrant of breast.  Mom does not have a DEBP at home but plans on obtaining one from [facility] Monday. Discussed [facility] loaner program but mom will use a manual pump.  Mom instructed on piston and harmony manual pump.  Stressed importance of pumping every 3 hours.  Recommended making an outpatient appointment to assist with latching baby to breast.  Questions answered.  Maternal Data    Feeding Feeding Type: Bottle Fed - Formula Nipple Type: Regular  LATCH Score                   Interventions    Lactation Tools Discussed/Used     Consult Status Consult Status: Complete Follow-up type: Call as needed    Huston Foley 01/28/2018, 9:46 AM

## 2018-01-31 ENCOUNTER — Ambulatory Visit (INDEPENDENT_AMBULATORY_CARE_PROVIDER_SITE_OTHER): Payer: Medicaid Other | Admitting: Family Medicine

## 2018-01-31 ENCOUNTER — Encounter: Payer: Self-pay | Admitting: Family Medicine

## 2018-01-31 VITALS — BP 140/82 | HR 72 | Wt 186.2 lb

## 2018-01-31 DIAGNOSIS — N61 Mastitis without abscess: Secondary | ICD-10-CM

## 2018-01-31 DIAGNOSIS — Z3042 Encounter for surveillance of injectable contraceptive: Secondary | ICD-10-CM

## 2018-01-31 DIAGNOSIS — F419 Anxiety disorder, unspecified: Secondary | ICD-10-CM

## 2018-01-31 DIAGNOSIS — K5901 Slow transit constipation: Secondary | ICD-10-CM

## 2018-01-31 MED ORDER — DICLOXACILLIN SODIUM 500 MG PO CAPS: 500.0000 mg | ORAL_CAPSULE | Freq: Four times a day (QID) | ORAL | 0 refills | Status: AC

## 2018-01-31 MED ORDER — MEDROXYPROGESTERONE ACETATE 150 MG/ML IM SUSP: 150.0000 mg | INTRAMUSCULAR | 3 refills | Status: DC

## 2018-01-31 MED ORDER — POLYETHYLENE GLYCOL 3350 17 G PO PACK: 17.0000 g | PACK | Freq: Every day | ORAL | 0 refills | Status: DC

## 2018-01-31 MED ORDER — DICLOXACILLIN SODIUM 500 MG PO CAPS: 500.0000 mg | ORAL_CAPSULE | Freq: Four times a day (QID) | ORAL | 0 refills | Status: DC

## 2018-01-31 MED ORDER — MEDROXYPROGESTERONE ACETATE 150 MG/ML IM SUSP
150.0000 mg | Freq: Once | INTRAMUSCULAR | Status: AC
  Administered 2018-01-31: 150 mg via INTRAMUSCULAR

## 2018-01-31 MED ORDER — BUSPIRONE HCL 10 MG PO TABS: 20.0000 mg | ORAL_TABLET | Freq: Three times a day (TID) | ORAL | 1 refills | Status: DC

## 2018-01-31 NOTE — Progress Notes (Signed)
Needs something called into for constipation.

## 2018-01-31 NOTE — Patient Instructions (Signed)
Breast Engorgement Breast engorgement is the overfilling of your breasts with breast milk. In the first few weeks after giving birth, you may experience breast engorgement. Although it is normal for your breasts to feel heavy, full, and uncomfortable within 3-5 days of giving birth, breast engorgement can make your breasts throb and feel hard, tightly stretched, warm, and tender. Engorgement peaks about the fifth day after you give birth. Breast engorgement can be easily treated and does not require you to stop breastfeeding. What are the causes? Some women delay feedings because of sore or cracked nipples, which can lead to engorgement. Cracked and sore nipples often are caused by inadequate latching (when your baby's mouth attaches to your breast to breastfeed). If your baby is latched on properly, he or she should be able to breastfeed as long as needed, without causing any pain. If you do feel pain while breastfeeding, take your baby off your breast and try again. Get help from your health care provider or a lactation consultant if you continue to have pain. Other causes of engorgement include:  Improper position of your baby while breastfeeding.  Allowing too much time to pass between feedings.  Reduction in breastfeeding because you give your baby water, juice, formula, breast milk from a bottle, or a pacifier instead of breastfeeding.  Changes in your baby's feeding patterns.  Weak sucking from your baby, which causes less milk to be taken out of your breast during feedings.  Fatigue, stress, anemia.  Plugged milk ducts.  A history of breast surgery.  What are the signs or symptoms? If your breasts become engorged, you may experience:  Breast swelling, tenderness, warmth, redness, or throbbing.  Breast hardness and stretching of the skin around your breast.  Flattening, tightening, and hardening of your nipple.  A low-grade fever, which can be confused with a breast  infection.  How is this treated? Breast engorgement should improve in 24-48 hours after following these recommendations:  Breastfeed when you feel the need to reduce the fullness of your breasts or when your baby shows signs of hunger. This is called "breastfeeding on demand."  Newborns (babies younger than 4 weeks) often breastfeed every 1-3 hours during the day. You may need to awaken your baby to feed if he or she is asleep at a feeding time.  Do not allow your baby to sleep longer than 5 hours during the night without a feeding.  Pump or hand-express breast milk before breastfeeding to soften your breast, areola, and nipple.  Apply warm, moist heat (in the shower or with warm water-soaked hand towels) just before feeding or pumping, or massage your breast before or during breastfeeding. This increases circulation and helps your milk to flow.  Completely empty your breasts when breastfeeding or pumping. Afterward, wear a snug bra (nursing or regular) or tank top for 1-2 days to signal your body to slightly decrease milk production. Only wear snug bras or tank tops to treat engorgement. Tight bras typically should be avoided by breastfeeding mothers. Once engorgement is relieved, return to wearing regular, loose-fitting clothes.  Apply ice packs to your breasts to lessen the pain from engorgement and relieve swelling, unless the ice is uncomfortable for you.  Do not delay feedings. Try to relax when it is time to feed your baby. This helps to trigger your "let-down reflex," which releases milk from your breast.  Ensure your baby is latched on to your breast and positioned properly while breastfeeding.  Allow your baby to remain  at your breast as long as he or she is latched on well and actively sucking. Your baby will let you know when he or she is done breastfeeding by pulling away from your breast or falling asleep.  Avoid introducing bottles or pacifiers to your baby in the early weeks  of breastfeeding. Wait to introduce these things until after resolving any breastfeeding challenges.  Try to pump your milk on the same schedule as when your baby would breastfeed if you are returning to work or away from home for an extended period.  Drink plenty of fluids to avoid dehydration, which can eventually put you at greater risk of breast engorgement.  Contact a health care provider if:  Engorgement lasts longer than 2 days, even after treatment.  You have flu-like symptoms, such as a fever, chills, or body aches.  Your breasts become increasingly red and painful. This information is not intended to replace advice given to you by your health care provider. Make sure you discuss any questions you have with your health care provider. Document Released: 07/31/2004 Document Revised: 09/17/2015 Document Reviewed: 09/28/2012 Elsevier Interactive Patient Education  2017 [employer] Mastitis Mastitis is inflammation of the breast tissue. It occurs most often in women who are breastfeeding, but it can also affect other women, and even sometimes men. What are the causes? Mastitis is usually caused by a bacterial infection. Bacteria enter the breast tissue through cuts or openings in the skin. Typically, this occurs with breastfeeding because of cracked or irritated skin. Sometimes, it can occur even when there is no opening in the skin. It can be associated with plugged milk (lactiferous) ducts. Nipple piercing can also lead to mastitis. Also, some forms of breast cancer can cause mastitis. What are the signs or symptoms?  Swelling, redness, tenderness, and pain in an area of the breast.  Swelling of the glands under the arm on the same side.  Fever. If an infection is allowed to progress, a collection of pus (abscess) may develop. How is this diagnosed? Your health care provider can usually diagnose mastitis based on your symptoms and a physical exam. Tests may be done to help  confirm the diagnosis. These may include:  Removal of pus from the breast by applying pressure to the area. This pus can be examined in the lab to determine which bacteria are present. If an abscess has developed, the fluid in the abscess can be removed with a needle. This can also be used to confirm the diagnosis and determine the bacteria present. In most cases, pus will not be present.  Blood tests to determine if your body is fighting a bacterial infection.  Mammogram or ultrasound tests to rule out other problems or diseases.  How is this treated? Antibiotic medicine is used to treat a bacterial infection. Your health care provider will determine which bacteria are most likely causing the infection and will select an appropriate antibiotic. This is sometimes changed based on the results of tests performed to identify the bacteria, or if there is no response to the antibiotic selected. Antibiotics are usually given by mouth. You may also be given medicine for pain. Mastitis that occurs with breastfeeding will sometimes go away on its own, so your health care provider may choose to wait 24 hours after first seeing you to decide whether a prescription medicine is needed. Follow these instructions at home:  Only take over-the-counter or prescription medicines for pain, fever, or discomfort as directed by your health care provider.  If your health care provider prescribed an antibiotic, take the medicine as directed. Make sure you finish it even if you start to feel better.  Do not wear a tight or underwire bra. Wear a soft, supportive bra.  Increase your fluid intake, especially if you have a fever.  Women who are breastfeeding should follow these instructions: ? Continue to empty the breast. Your health care provider can tell you whether this milk is safe for your infant or needs to be thrown out. You may be told to stop nursing until your health care provider thinks it is safe for your baby.  Use a breast pump if you are advised to stop nursing. ? Keep your nipples clean and dry. ? Empty the first breast completely before going to the other breast. If your baby is not emptying your breasts completely for some reason, use a breast pump to empty your breasts. ? If you go back to work, pump your breasts while at work to stay in time with your nursing schedule. ? Avoid allowing your breasts to become overly filled with milk (engorged). Contact a health care provider if:  You have pus-like discharge from the breast.  Your symptoms do not improve with the treatment prescribed by your health care provider within 2 days. Get help right away if:  Your pain and swelling are getting worse.  You have pain that is not controlled with medicine.  You have a red line extending from the breast toward your armpit.  You have a fever or persistent symptoms for more than 2-3 days.  You have a fever and your symptoms suddenly get worse. This information is not intended to replace advice given to you by your health care provider. Make sure you discuss any questions you have with your health care provider. Document Released: 04/05/2005 Document Revised: 09/11/2015 Document Reviewed: 11/03/2012 Elsevier Interactive Patient Education  2017 [employer].

## 2018-01-31 NOTE — Progress Notes (Signed)
   Subjective:    Patient ID: [name] Moreno is a 37 y.o. female presenting with Check Breast  on 01/31/2018  HPI: Has breast engorgement and fever, and pain with pumping. Trying to quit breast feeding. Not getting milk out. Breasts are firm and tender and red. Notes she is s/p C-section. Wants Depo today. Does not want OC's or IUD, considering Nexplanon. Needs something for anxiety and constipation.  Review of Systems  Constitutional: Positive for fever. Negative for chills.  Respiratory: Negative for shortness of breath.   Cardiovascular: Positive for chest pain.  Gastrointestinal: Negative for abdominal pain, nausea and vomiting.  Genitourinary: Negative for dysuria.  Skin: Negative for rash.      Objective:    There were no vitals taken for this visit. Physical Exam  Constitutional: She is oriented to person, place, and time. She appears well-developed and well-nourished. No distress.  HENT:  Head: Normocephalic and atraumatic.  Eyes: No scleral icterus.  Neck: Neck supple.  Cardiovascular: Normal rate.  Pulmonary/Chest: Effort normal.    Abdominal: Soft.  Neurological: She is alert and oriented to person, place, and time.  Skin: Skin is warm and dry.  Psychiatric: She has a normal mood and affect.  Incision is clean and dry, honeycomb removed      Assessment & Plan:   Problem List Items Addressed This Visit    None    Visit Diagnoses    Mastitis    -  Primary   diclox--use heat to reduce engorgement, then cabbage leaves to dry up the milk   Relevant Medications   dicloxacillin (DYNAPEN) 500 MG capsule   Encounter for surveillance of injectable contraceptive       Depo today and q 3 months. Rx sent in.   Relevant Medications   medroxyPROGESTERone (DEPO-PROVERA) 150 MG/ML injection   Slow transit constipation       trial of Miralax   Relevant Medications   polyethylene glycol (MIRALAX) packet   Anxiety       resume Buspar--may need addition of SSRI--will  recheck   Relevant Medications   busPIRone (BUSPAR) 10 MG tablet      Total face-to-face time with patient: 25 minutes. Over 50% of encounter was spent on counseling and coordination of care. Return in about 4 weeks (around 02/28/2018) for pp check.  Reva Bores 01/31/2018 2:14 PM

## 2018-02-03 ENCOUNTER — Telehealth: Payer: Self-pay

## 2018-02-03 ENCOUNTER — Encounter (HOSPITAL_COMMUNITY): Payer: Self-pay

## 2018-02-03 ENCOUNTER — Ambulatory Visit (HOSPITAL_COMMUNITY): Payer: Medicaid Other

## 2018-02-03 NOTE — Telephone Encounter (Signed)
Return patient called regarding medication question. No answer or voice mail to leave a message.

## 2018-02-06 ENCOUNTER — Encounter: Payer: Self-pay | Admitting: Obstetrics & Gynecology

## 2018-02-06 ENCOUNTER — Ambulatory Visit (INDEPENDENT_AMBULATORY_CARE_PROVIDER_SITE_OTHER): Payer: Medicaid Other | Admitting: Obstetrics & Gynecology

## 2018-02-06 VITALS — BP 131/81 | HR 76 | Wt 181.0 lb

## 2018-02-06 DIAGNOSIS — G8918 Other acute postprocedural pain: Secondary | ICD-10-CM

## 2018-02-06 DIAGNOSIS — F419 Anxiety disorder, unspecified: Secondary | ICD-10-CM

## 2018-02-06 DIAGNOSIS — K5901 Slow transit constipation: Secondary | ICD-10-CM

## 2018-02-06 DIAGNOSIS — T8149XA Infection following a procedure, other surgical site, initial encounter: Secondary | ICD-10-CM

## 2018-02-06 DIAGNOSIS — O9122 Nonpurulent mastitis associated with the puerperium: Secondary | ICD-10-CM

## 2018-02-06 MED ORDER — POLYETHYLENE GLYCOL 3350 17 G PO PACK: 17.0000 g | PACK | Freq: Every day | ORAL | 2 refills | Status: DC

## 2018-02-06 MED ORDER — FLUCONAZOLE 150 MG PO TABS: 150.0000 mg | ORAL_TABLET | Freq: Once | ORAL | 3 refills | Status: AC

## 2018-02-06 MED ORDER — OXYCODONE HCL 10 MG PO TABS: 10.0000 mg | ORAL_TABLET | Freq: Four times a day (QID) | ORAL | 0 refills | Status: DC | PRN

## 2018-02-06 MED ORDER — SULFAMETHOXAZOLE-TRIMETHOPRIM 800-160 MG PO TABS: 1.0000 | ORAL_TABLET | Freq: Two times a day (BID) | ORAL | 1 refills | Status: DC

## 2018-02-06 MED ORDER — BUSPIRONE HCL 10 MG PO TABS: 20.0000 mg | ORAL_TABLET | Freq: Three times a day (TID) | ORAL | 1 refills | Status: DC

## 2018-02-06 MED ORDER — HYDROXYZINE PAMOATE 25 MG PO CAPS: 25.0000 mg | ORAL_CAPSULE | Freq: Every evening | ORAL | 3 refills | Status: DC | PRN

## 2018-02-06 NOTE — Progress Notes (Signed)
POSTPARTUM  VISIT NOTE  History:  37 y.o. [id number] POD#13 s/p PLTCS for abruption and NRFHT at [redacted]w[redacted]d here today with report of having a lot of pain in incision and being out of pain medications.  Pain is not alleviated by any other medications other than Oxycodone.    She was was recently seen by Dr. Shawnie Pons on 01/31/18 and treated for mastitis, also given medication for anxiety. She reports that the Pharmacy never got the RX, she wants the medications (Buspar and Vistaril) prescribed again. Still taking the Dicloxacillin, mastitis is improved. Has stopped breastfeeding, does not plan to resume. Baby is doing well. She denies any abnormal vaginal discharge, bleeding, urinary symptoms or other concerns.   Past Medical History:  Diagnosis Date  . Depression   . Headache   . Heart murmur   . Multinodular goiter   . Thyroid nodule     Past Surgical History:  Procedure Laterality Date  . CESAREAN SECTION N/A 01/24/2018   Procedure: CESAREAN SECTION;  Surgeon: Conan Bowens, MD;  Location: [facility] BIRTHING SUITES;  Service: Obstetrics;  Laterality: N/A;    The following portions of the patient's history were reviewed and updated as appropriate: allergies, current medications, past family history, past medical history, past social history, past surgical history and problem list.   Review of Systems:  Pertinent items noted in HPI and remainder of comprehensive ROS otherwise negative.  Objective:  Physical Exam BP 131/81   Pulse 76   Wt 181 lb (82.1 kg)   BMI 32.06 kg/m  CONSTITUTIONAL: Well-developed, well-nourished female in no acute distress.  NEUROLOGIC: Alert and oriented to person, place, and time. Normal muscle tone coordination. No cranial nerve deficit noted. PSYCHIATRIC: Normal mood and affect. Normal behavior. Normal judgment and thought content. CARDIOVASCULAR: Normal heart rate noted RESPIRATORY: Effort and breath sounds normal, no problems with respiration noted ABDOMEN: Soft,  no distention noted, diffuse moderate tenderness around incision INCISION: Steristrips removed. Mild blanching erythema noted especially on top part of incision, and mild induration noted especially on left side.  Tender throughout incision. PELVIC: Deferred    Assessment & Plan:  1. Postoperative pain Oxycodone refilled for patient, this will be the last narcotic prescription for now. If pain worsens, was told to call back/come in. Consider Gabapentin or other medications if pain continues. - Oxycodone HCl 10 MG TABS; Take 1 tablet (10 mg total) by mouth every 6 (six) hours as needed (pain scale > 7).  Dispense: 30 tablet; Refill: 0  2. Postoperative wound cellulitis Bactrim DS ordered for incisional cellulitis.  Diflucan ordered for possible  - sulfamethoxazole-trimethoprim (BACTRIM DS,SEPTRA DS) 800-160 MG tablet; Take 1 tablet by mouth 2 (two) times daily.  Dispense: 14 tablet; Refill: 1 - fluconazole (DIFLUCAN) 150 MG tablet; Take 1 tablet (150 mg total) by mouth once for 1 dose. Can take additional dose three days later if symptoms persist  Dispense: 1 tablet; Refill: 3  3. Mastitis during puerperium Will just take Bactrim for both mastitis and incision to avoid taking multiple antibiotics. - sulfamethoxazole-trimethoprim (BACTRIM DS,SEPTRA DS) 800-160 MG tablet; Take 1 tablet by mouth 2 (two) times daily.  Dispense: 14 tablet; Refill: 1  4. Anxiety Medications printed for patient - busPIRone (BUSPAR) 10 MG tablet; Take 2 tablets (20 mg total) by mouth 3 (three) times daily.  Dispense: 90 tablet; Refill: 1 - hydrOXYzine (VISTARIL) 25 MG capsule; Take 1 capsule (25 mg total) by mouth at bedtime as needed for anxiety.  Dispense: 30 capsule;  Refill: 3  5. Slow transit constipation Medication re-printed for patient - polyethylene glycol (MIRALAX) packet; Take 17 g by mouth daily.  Dispense: 14 each; Refill: 2  Routine preventative health maintenance measures emphasized. Please refer  to After Visit Summary for other counseling recommendations.   Return in about 2 weeks (around 02/20/2018) for Incision Check.   Jaynie Collins, MD, FACOG Obstetrician & Gynecologist, [facility] [employer], [facility]

## 2018-02-07 ENCOUNTER — Ambulatory Visit: Payer: Medicaid Other

## 2018-02-21 ENCOUNTER — Ambulatory Visit: Payer: Medicaid Other | Admitting: Obstetrics & Gynecology

## 2018-02-21 NOTE — Progress Notes (Deleted)
   Patient did not show up today for her scheduled appointment.   UGONNA  ANYANWU, MD, FACOG Obstetrician & Gynecologist, [facility], [facility]  

## 2018-02-22 ENCOUNTER — Ambulatory Visit: Payer: Medicaid Other | Admitting: Family Medicine

## 2018-02-28 ENCOUNTER — Encounter: Payer: Self-pay | Admitting: Family Medicine

## 2018-02-28 ENCOUNTER — Ambulatory Visit: Payer: Medicaid Other | Admitting: Family Medicine

## 2018-02-28 NOTE — Progress Notes (Signed)
Patient did not keep appointment today. She will be called to reschedule.  

## 2018-02-28 NOTE — Progress Notes (Deleted)
Post Partum Exam  [name] PollockMelinda Tarry is a 37 y.o. [id number] female who presents for a postpartum visit. She is {1-10:13787} {time; units:18646} postpartum following a {delivery:12449}. I have fully reviewed the prenatal and intrapartum course. The delivery was at 35 gestational weeks.  Anesthesia: epidural.Postpartum course has been uncomplicated. Baby's course has been uncomplicated. Baby is feeding by {breast/bottle:69}. Bleeding normal. Bowel function is normal. Bladder function is {normal:32111}. Patient {is/is not:9024} sexually active. Contraception method is {contraceptive method:5051}. Postpartum depression screening:neg  {Common ambulatory SmartLinks:19316} Last pap smear done *** and was {Desc; normal/abnormal:11317::"Normal"}  Review of Systems {ros; complete:30496}    Objective:  unknown if currently breastfeeding.  General:  {gen appearance:16600}   Breasts:  {breast exam:1202::"inspection negative, no nipple discharge or bleeding, no masses or nodularity palpable"}  Lungs: {lung exam:16931}  Heart:  {heart exam:5510}  Abdomen: {abdomen exam:16834}   Vulva:  {labia exam:12198}  Vagina: {vagina exam:12200}  Cervix:  {cervix exam:14595}  Corpus: {uterus exam:12215}  Adnexa:  {adnexa exam:12223}  Rectal Exam: {rectal/vaginal exam:12274}        Assessment:    *** postpartum exam. Pap smear {done:10129} at today's visit.   Plan:   1. Contraception: {method:5051} 2. *** 3. Follow up in: {1-10:13787} {time; units:19136} or as needed.

## 2018-03-01 ENCOUNTER — Telehealth: Payer: Self-pay

## 2018-03-02 ENCOUNTER — Ambulatory Visit (INDEPENDENT_AMBULATORY_CARE_PROVIDER_SITE_OTHER): Payer: Medicaid Other | Admitting: Obstetrics & Gynecology

## 2018-03-02 DIAGNOSIS — N6310 Unspecified lump in the right breast, unspecified quadrant: Secondary | ICD-10-CM

## 2018-03-02 DIAGNOSIS — F419 Anxiety disorder, unspecified: Secondary | ICD-10-CM

## 2018-03-02 DIAGNOSIS — Z8632 Personal history of gestational diabetes: Secondary | ICD-10-CM

## 2018-03-02 DIAGNOSIS — Z98891 History of uterine scar from previous surgery: Secondary | ICD-10-CM

## 2018-03-02 NOTE — Progress Notes (Signed)
   POSTPARTUM OFFICE VISIT NOTE  History:  37 y.o. [id number] here today for six week postpartum follow up. Reports having mild pain in incision and requests Oxycodone. Was treated for mastitis a few weeks ago, doing well,but reports enlarging right breast mass. 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   Has stopped breastfeeding since she left hospital, does not plan to resume. Reports that she is still very anxious, Buspar gives her nightmares and she will follow up with her mental health provider. No SI/HI.  Baby is doing well. She denies any abnormal vaginal discharge, bleeding, urinary symptoms or other concerns.She denies any abnormal vaginal discharge, bleeding, pelvic pain or other concerns.   Past Medical History:  Diagnosis Date  . Depression   . Headache   . Heart murmur   . Multinodular goiter   . Thyroid nodule     Past Surgical History:  Procedure Laterality Date  . CESAREAN SECTION N/A 01/24/2018   Procedure: CESAREAN SECTION;  Surgeon: Conan Bowensavis, Kelly M, MD;  [facility];  Service: Obstetrics;  Laterality: N/A;    The following portions of the patient's history were reviewed and updated as appropriate: allergies, current medications, past family history, past medical history, past social history, past surgical history and problem list.   Health Maintenance:  Normal pap and negative HRHPV on 08/15/2017.    Review of Systems:  Pertinent items noted in HPI and remainder of comprehensive ROS otherwise negative.  Objective:  Physical Exam BP 138/88   Pulse 82   Wt 182 lb (82.6 kg)   BMI 32.24 kg/m  CONSTITUTIONAL: Well-developed, well-nourished female in no acute distress.  HEENT:  Normocephalic, atraumatic. External right and left ear normal. No scleral icterus.  NECK: Normal range of motion, supple, no masses noted on observation SKIN: Skin is warm and dry. No rash noted. Not diaphoretic. No  erythema. No pallor. MUSCULOSKELETAL: Normal range of motion. No edema noted. NEUROLOGIC: Alert and oriented to person, place, and time. Normal muscle tone coordination. No cranial nerve deficit noted. PSYCHIATRIC: Normal mood and affect. Normal behavior. Normal judgment and thought content. CARDIOVASCULAR: Normal heart rate noted RESPIRATORY: Effort and breath sounds normal, no problems with respiration noted BREASTS: Large ~10 cm right breast mobile mass palpated in upper outer quadrant.  Nontender. No nipple discharge. Normal left breast exam.  ABDOMEN: Soft, no distention noted.   Incision is well healed, C/D/I, no erythema, no drainage, no induration, minimal tenderness to palpation.  PELVIC: Deferred   Assessment & Plan:  1. Breast mass, right Repeat scan ordered, will follow up results and manage accordingly. - US BREAST LTD UNI RIGHT INC AXILLA; Future - MM Digital Diagnostic Unilat R; Future  2. Anxiety Patient will follow up with her provider  3. History of gestational diabetes Will return for postpartum 2 hr GTT soon  4. S/P primary low transverse C-section 5. Postpartum examination following cesarean delivery Patient informed that narcotics will not be given. Recommended Ibuprofen and Tylenol prn pain. No significant findings on exam.  Routine preventative health maintenance measures emphasized. Please refer to After Visit Summary for other counseling recommendations.   Return for any gynecologic concerns.   Jaynie CollinsUGONNA  Cormac Wint, MD, FACOG Obstetrician & Gynecologist, [facility] [employer], [facility]

## 2018-03-02 NOTE — Patient Instructions (Signed)
Return to clinic for any scheduled appointments or obstetric concerns, or go to [facility] for evaluation  

## 2018-03-07 ENCOUNTER — Other Ambulatory Visit: Payer: Medicaid Other

## 2018-03-07 DIAGNOSIS — O0990 Supervision of high risk pregnancy, unspecified, unspecified trimester: Secondary | ICD-10-CM

## 2018-03-08 ENCOUNTER — Telehealth: Payer: Self-pay | Admitting: *Deleted

## 2018-03-08 LAB — GLUCOSE TOLERANCE, 2 HOURS
Glucose, 2 hour: 108 mg/dL (ref 65–139)
Glucose, GTT - Fasting: 91 mg/dL (ref 65–99)

## 2018-03-08 NOTE — Telephone Encounter (Signed)
Pt called wanting 2hr gtt results. Results viewed by Dr Alvester MorinNewton, pt informed that results where normal.   Scheryl Martenhristine , RN

## 2018-03-09 NOTE — Telephone Encounter (Signed)
Opened in error

## 2018-03-10 NOTE — Telephone Encounter (Signed)
Entered in error

## 2018-04-07 ENCOUNTER — Encounter: Payer: Self-pay | Admitting: Radiology

## 2018-04-18 ENCOUNTER — Ambulatory Visit: Payer: Medicaid Other

## 2018-04-28 ENCOUNTER — Ambulatory Visit (INDEPENDENT_AMBULATORY_CARE_PROVIDER_SITE_OTHER): Payer: Medicaid Other

## 2018-04-28 VITALS — BP 128/83 | HR 72

## 2018-04-28 DIAGNOSIS — Z3042 Encounter for surveillance of injectable contraceptive: Secondary | ICD-10-CM

## 2018-04-28 MED ORDER — MEDROXYPROGESTERONE ACETATE 150 MG/ML IM SUSP: 150.0000 mg | Freq: Once | INTRAMUSCULAR | Status: DC

## 2018-04-28 NOTE — Progress Notes (Signed)
Patient presented to the office today for depo-provera injection received in right gluteal.  NDC# [phone] Patient tolerated well and will follow up in three months for next injection.

## 2018-07-13 ENCOUNTER — Encounter: Payer: Self-pay | Admitting: Radiology

## 2018-07-17 ENCOUNTER — Ambulatory Visit: Payer: Medicaid Other

## 2018-12-25 IMAGING — MG US  BREAST BX W/ LOC DEV 1ST LESION IMG BX SPEC US GUIDE*R*
1 series · 7 of 7 positions shown · non-contrast
Comparison: Previous exam(s).

ADDENDUM:
Pathology of the right breast biopsy revealed A. BREAST, RIGHT, 10
O'CLOCK; ULTRASOUND-GUIDED CORE BIOPSY: FEATURES OF LACTATING
ADENOMA. NEGATIVE FOR ATYPIA AND MALIGNANCY.

This was found to be concordant with Dr. Carruthers impression and
notes.
Recommendation: Clinical follow-up.
At the patient's request, results and recommendations were relayed
to the patient by phone by Dmc, Saepudin on 10/31/17. The patient
stated she did well following the biopsy with no bleeding, bruising,
or hematoma. Post biopsy instructions were reviewed with the patient
and all of her questions were answered. She was encouraged to
contact the [HOSPITAL] with any further questions or
concerns.
Addendum by Dmc, Saepudin on 11/01/17.
CLINICAL DATA: Right breast 10 o'clock mass.
EXAM:
ULTRASOUND GUIDED RIGHT BREAST CORE NEEDLE BIOPSY

[Series 1: MG view · 0.07mm/px · 7 of 7 slices shown]
[im 1/7]
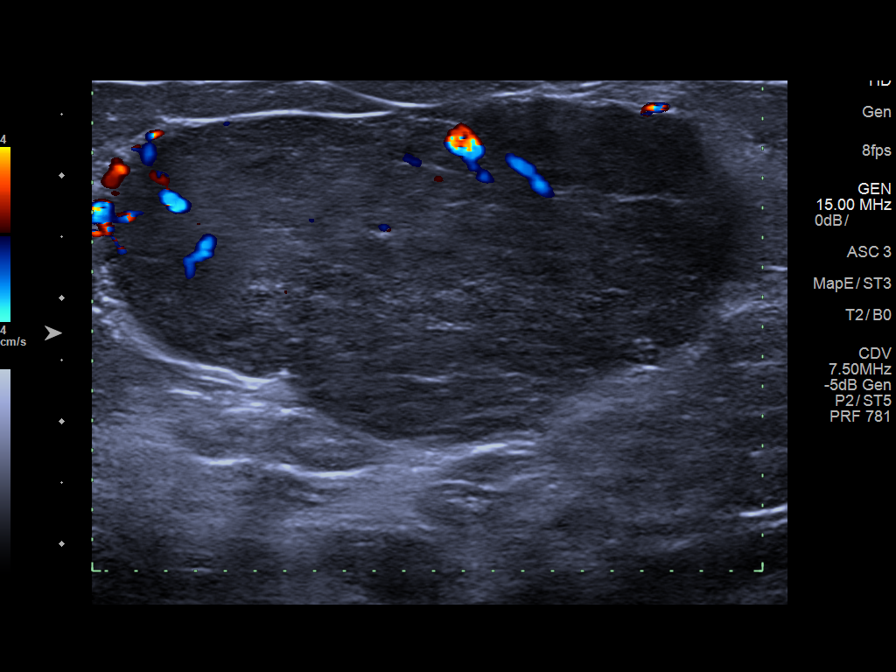
[im 2/7]
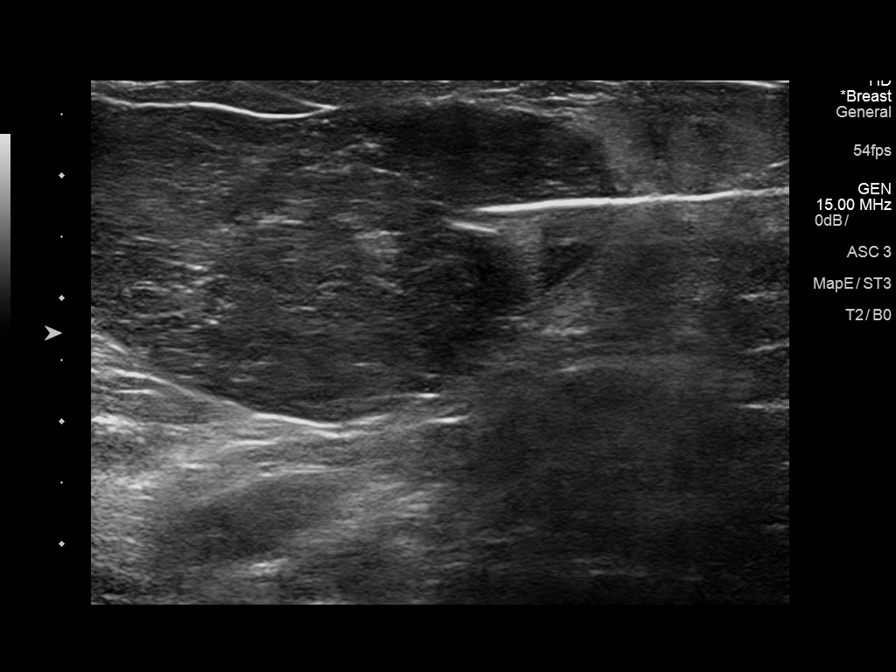
[im 3/7]
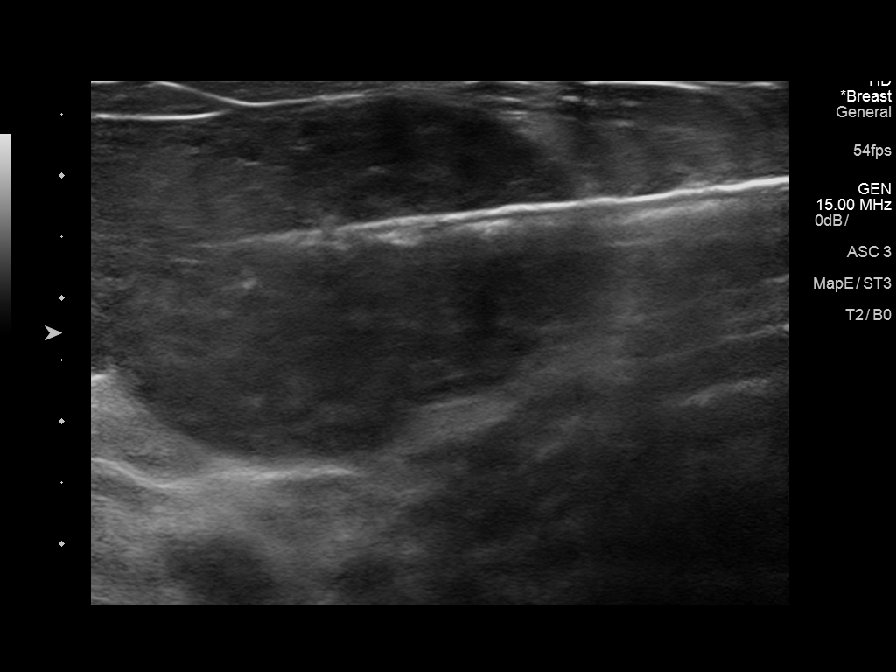
[im 4/7]
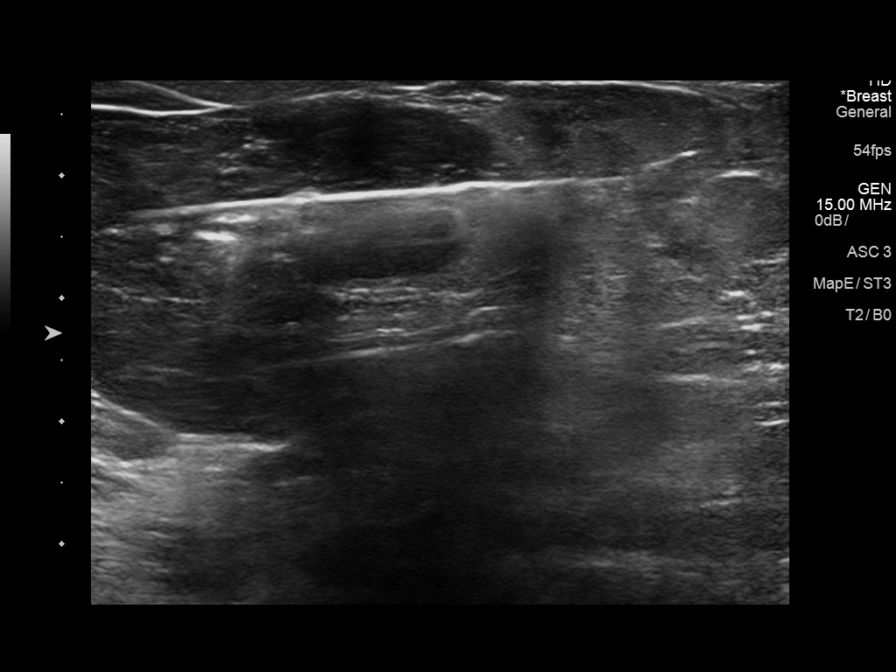
[im 5/7]
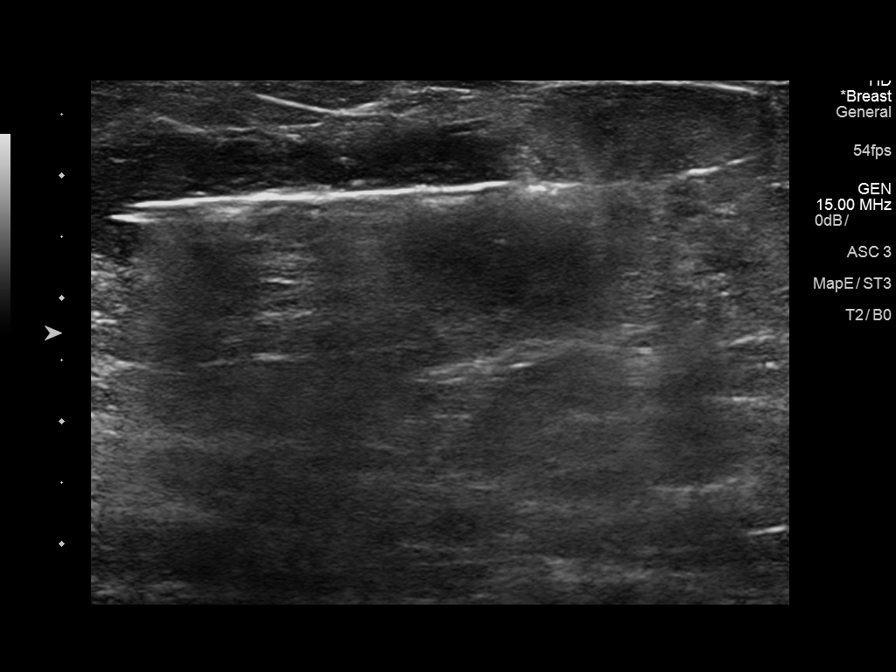
[im 6/7]
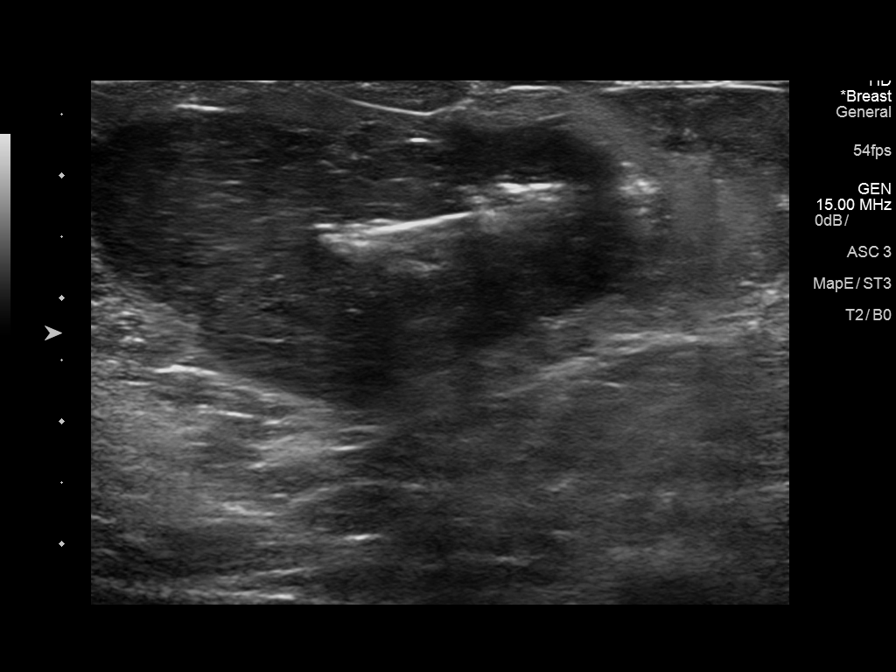
[im 7/7]
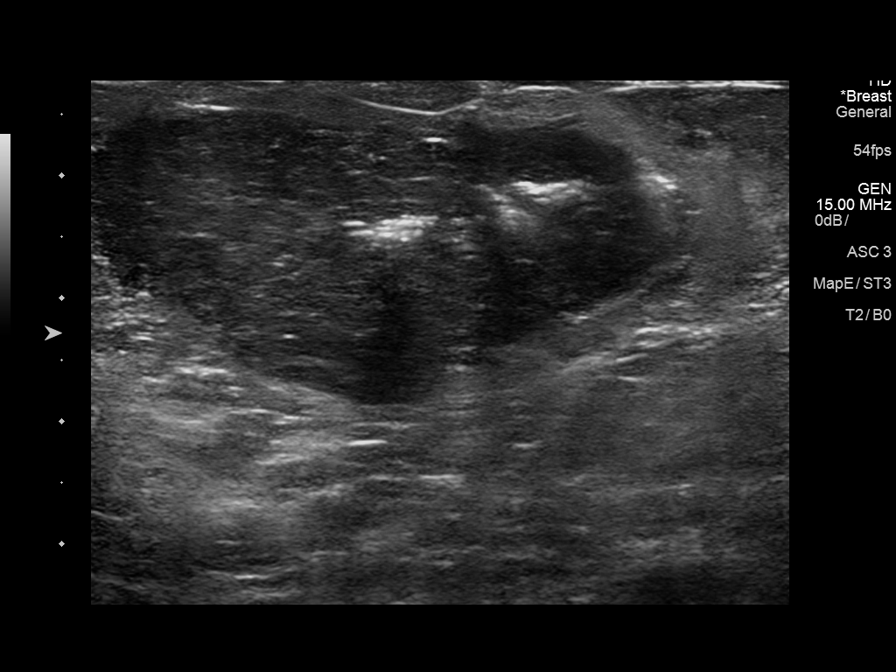

[7 of 7 positions shown; findings below may reference images not displayed]



Lesion quadrant: Upper outer quadrant

Using sterile technique and 1% Lidocaine as local anesthetic, under
direct ultrasound visualization, a 14 gauge Callum device was
used to perform biopsy of right breast 10 o'clock mass using a
inferior approach. At the conclusion of the procedure a heart shaped
tissue marker clip was deployed into the biopsy cavity.
IMPRESSION: Ultrasound guided biopsy of right breast. No apparent complications.

## 2019-02-07 DIAGNOSIS — R030 Elevated blood-pressure reading, without diagnosis of hypertension: Secondary | ICD-10-CM | POA: Diagnosis not present

## 2019-02-07 DIAGNOSIS — R52 Pain, unspecified: Secondary | ICD-10-CM | POA: Diagnosis not present

## 2019-02-07 DIAGNOSIS — R202 Paresthesia of skin: Secondary | ICD-10-CM | POA: Diagnosis not present

## 2019-02-07 DIAGNOSIS — R5383 Other fatigue: Secondary | ICD-10-CM | POA: Diagnosis not present

## 2019-02-07 DIAGNOSIS — F419 Anxiety disorder, unspecified: Secondary | ICD-10-CM | POA: Diagnosis not present

## 2019-02-26 IMAGING — US US MFM OB TRANSVAGINAL
1 series · 14 of 28 positions shown · non-contrast
Comparison: none

[Series 1: us mfm ob transvaginal · 51 acquisitions, 14 frames shown]
[im 2/51]
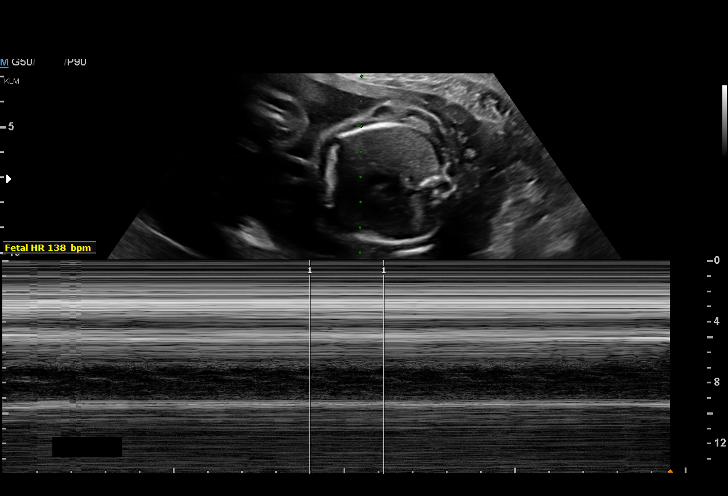
[im 6/51]
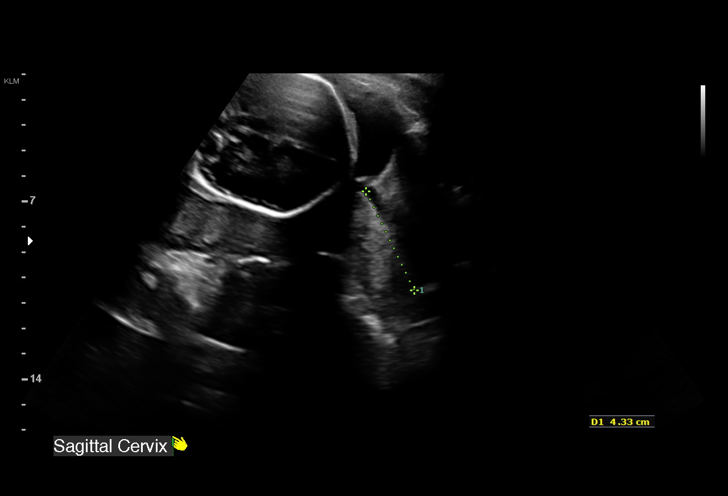
[im 10/51]
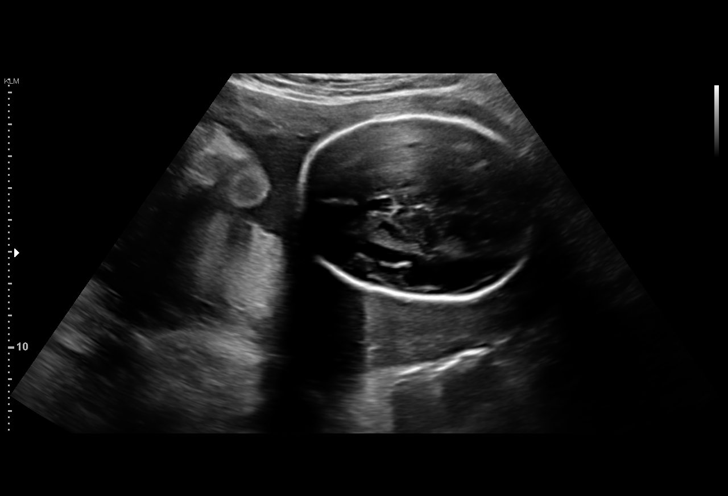
[im 13/51]
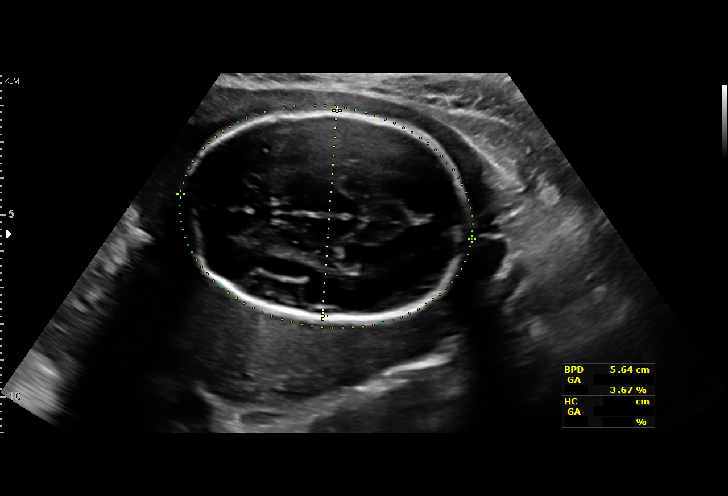
[im 17/51]
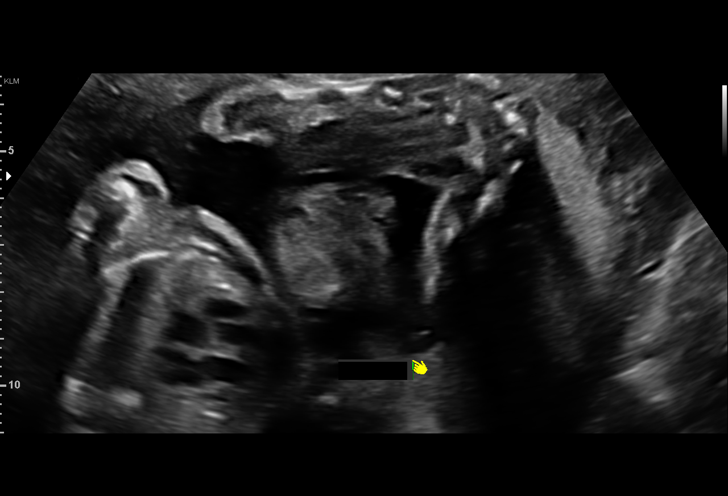
[im 21/51]
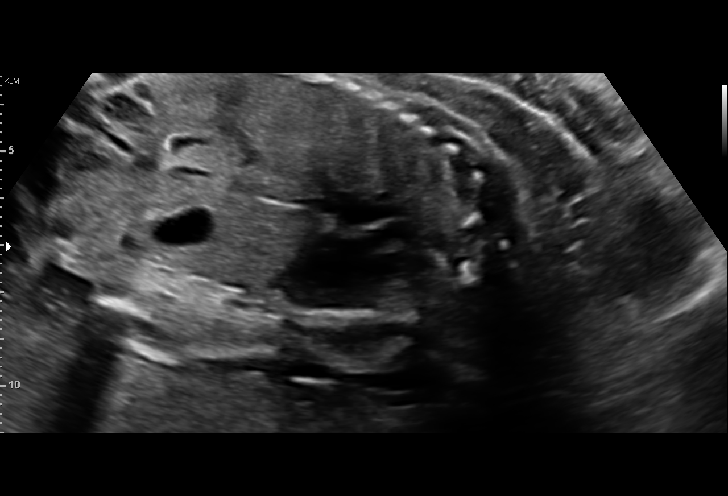
[im 25/51]
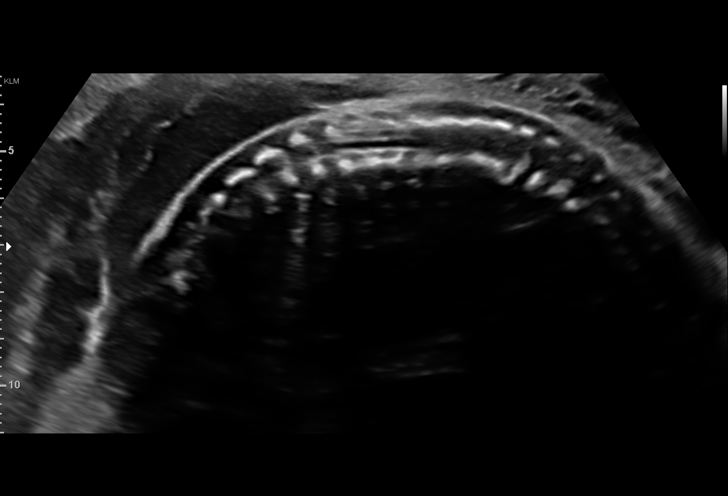
[im 28/51]
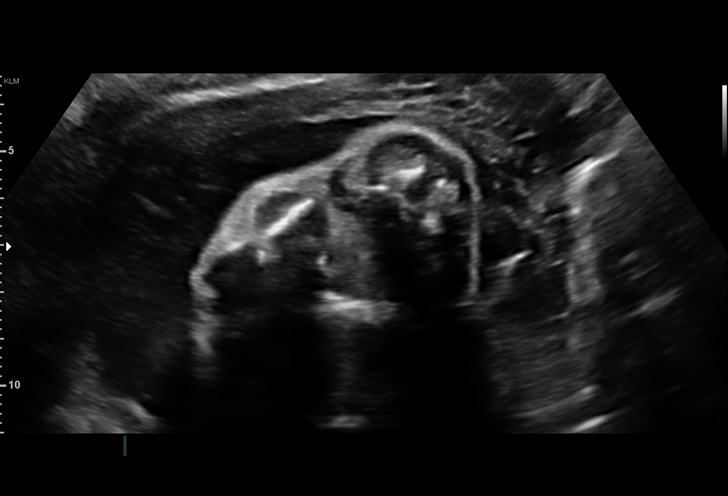
[im 32/51]
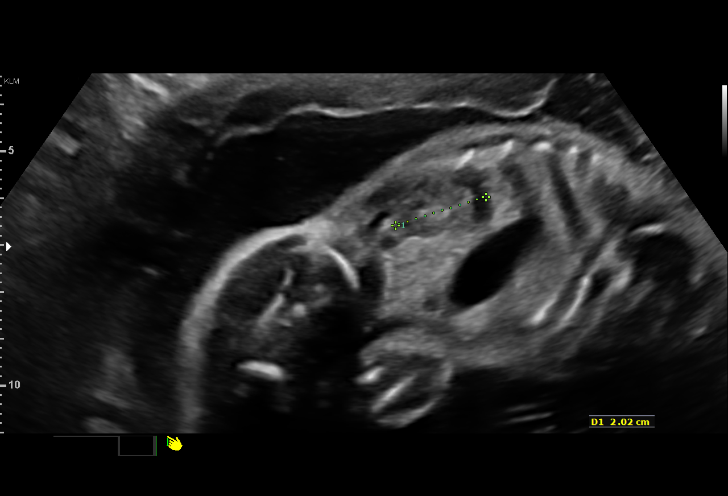
[im 36/51]
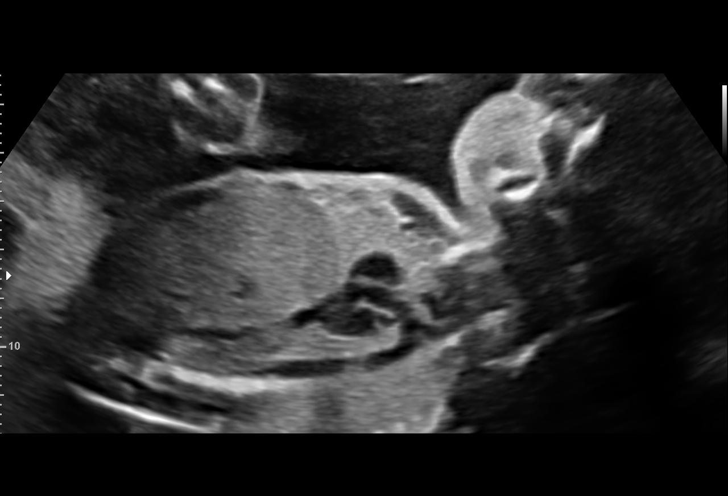
[im 39/51]
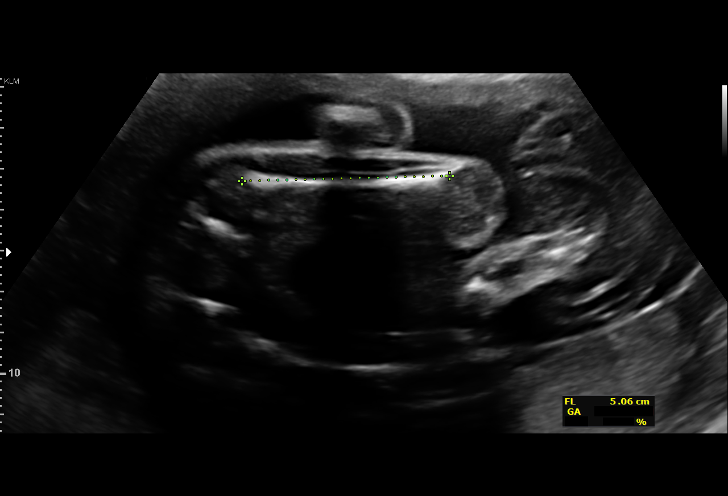
[im 43/51]
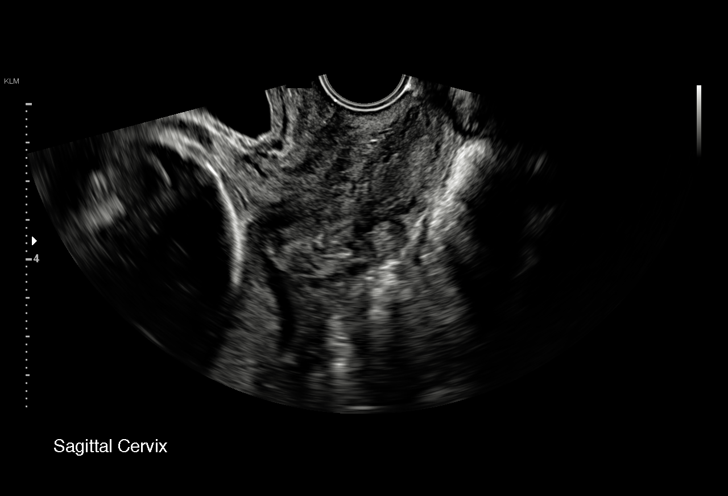
[im 47/51]
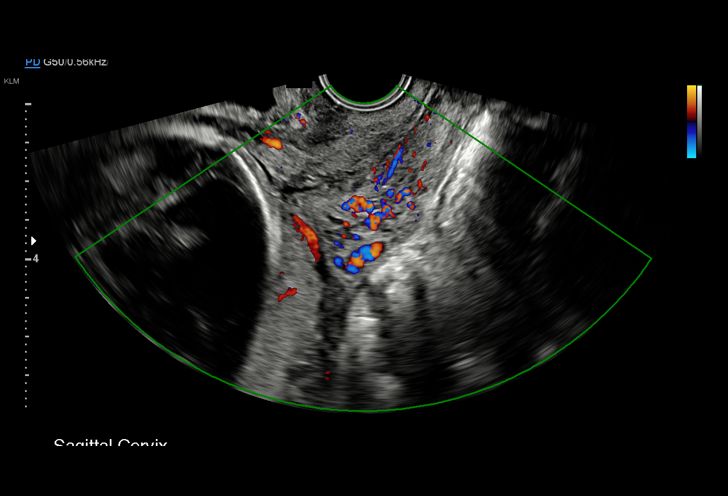
[im 51/51]
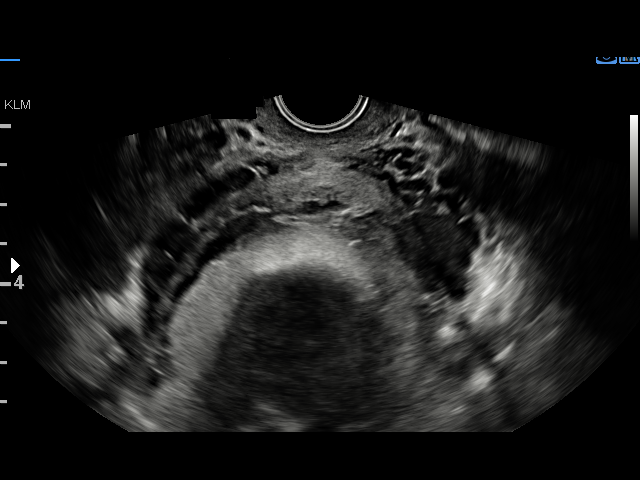

[14 of 28 positions shown; findings below may reference images not displayed]

1  QUIRIJN AMAZIGH            [id number]      [id number]     [id number]
2  QUIRIJN AMAZIGH            [id number]      [id number]     [id number]
Indications

24 weeks gestation of pregnancy
Poor obstetric history: Previous preterm
delivery, antepartum (@ 86wks, placental
abruption; started 17P injections 09/14/17)
Thyroid disease in pregnancy (multinodular     O99.280,
goiter)
Medical complication of pregnancy (Heart
murmur)
Placenta previa specified as without
hemorrhage, second trimester
OB History

Blood Type:            Height:  5'3"   Weight (lb):  168       BMI:
Gravidity:    3         Prem:   1         SAB:   1
Living:       1
Fetal Evaluation

Num Of Fetuses:     1
Fetal Heart         138
Rate(bpm):
Cardiac Activity:   Observed
Presentation:       Cephalic
Placenta:           Posterior Previa
P. Cord Insertion:  Previously Visualized

Amniotic Fluid
AFI FV:      Subjectively within normal limits
Largest Pocket(cm)
5.5
Biometry

BPD:      56.5  mm     G. Age:  23w 2d          4  %    CI:        65.71   %    70 - 86
FL/HC:      22.5   %    18.7 -
HC:      223.7  mm     G. Age:  24w 3d         17  %    HC/AC:      1.11        1.04 -
AC:      200.8  mm     G. Age:  24w 5d         37  %    FL/BPD:     89.0   %    71 - 87
FL:       50.3  mm     G. Age:  27w 0d         92  %    FL/AC:      25.0   %    20 - 24

Est. FW:     810  gm    1 lb 13 oz      64  %
Gestational Age

LMP:           24w 6d        Date:  05/21/17                 EDD:   02/25/18
U/S Today:     24w 6d                                        EDD:   02/25/18
Best:          24w 6d     Det. By:  LMP  (05/21/17)          EDD:   02/25/18
Anatomy

Cranium:               Appears normal         Aortic Arch:            Previously seen
Cavum:                 Appears normal         Ductal Arch:            Previously seen
Ventricles:            Appears normal         Diaphragm:              Appears normal
Choroid Plexus:        Appears normal         Stomach:                Appears normal, left
sided
Cerebellum:            Appears normal         Abdomen:                Appears normal
Posterior Fossa:       Appears normal         Abdominal Wall:         Previously seen
Nuchal Fold:           Previously seen        Cord Vessels:           Previously seen
Face:                  Orbits and profile     Kidneys:                Appear normal
previously seen
Lips:                  Previously seen        Bladder:                Appears normal
Thoracic:              Appears normal         Spine:                  Appears normal
Heart:                 Previously seen, EIF   Upper Extremities:      Previously seen
RVOT:                  Appears normal         Lower Extremities:      Previously seen
LVOT:                  Appears normal

Other:  Fetus appears to be a female. Heels previously visualized. Open
hands previously visualized. Nasal bone visualized. Technically
difficult due to fetal position.
Cervix Uterus Adnexa

Cervix
Length:            4.4  cm.
Normal appearance by transvaginal scan
Impression

Patient with placenta previa returns for fetal growth
assessment.  She gives no history of vaginal bleeding.

Amniotic fluid is normal and good fetal activity seen. Fetal
growth is appropriate for gestational age.

We performed last vaginal ultrasound to evaluate the cervix.
Placental edge is reaching the internal loss and placenta
previa is confirmed.

In some cases placenta previa can resolve with advancing
gestation.
Recommendations

We made an appointment for the patient to return in 6 weeks.

## 2019-04-25 IMAGING — US US MFM OB LIMITED
1 series · 10 of 10 positions shown · non-contrast
Comparison: none

[Series 1: us mfm ob limited · 10 acquisitions, 10 frames shown]
[im 1/10]
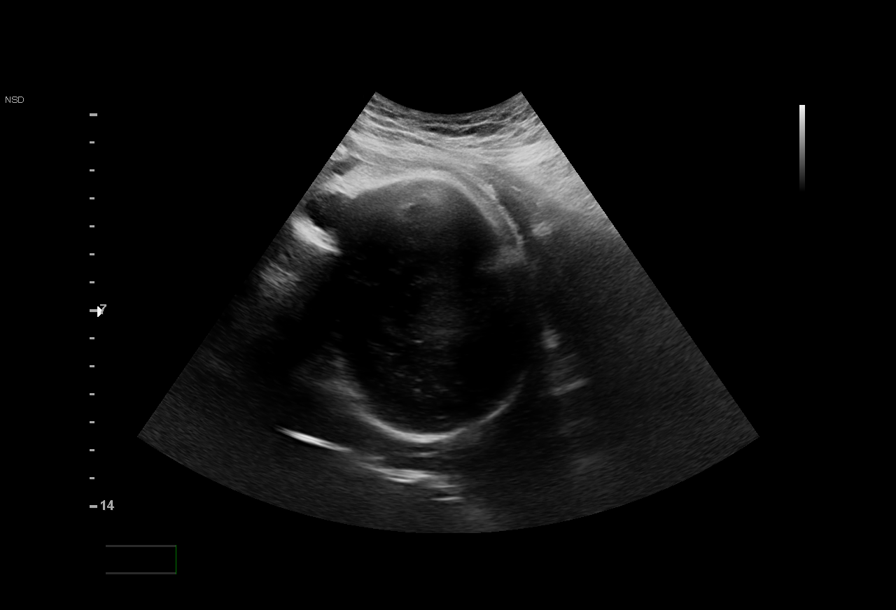
[im 2/10]
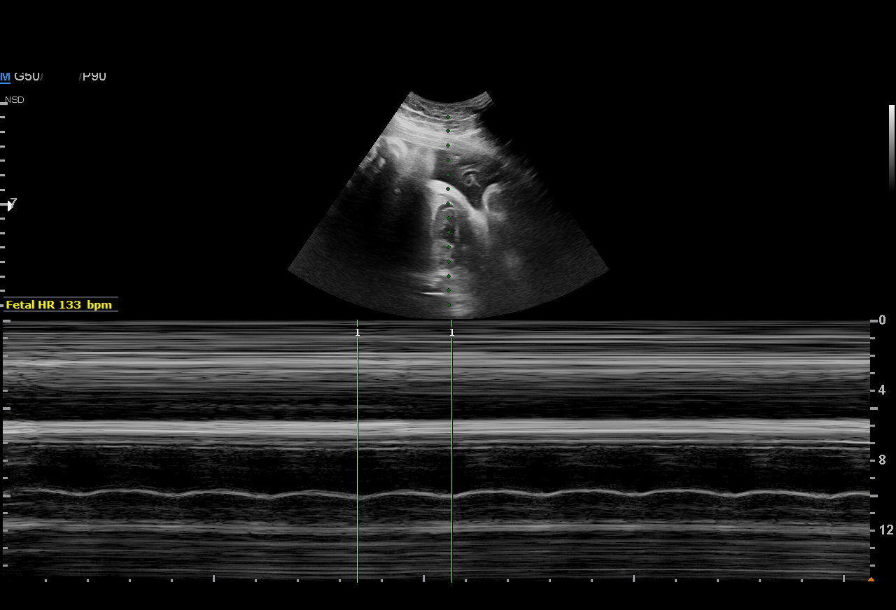
[im 3/10]
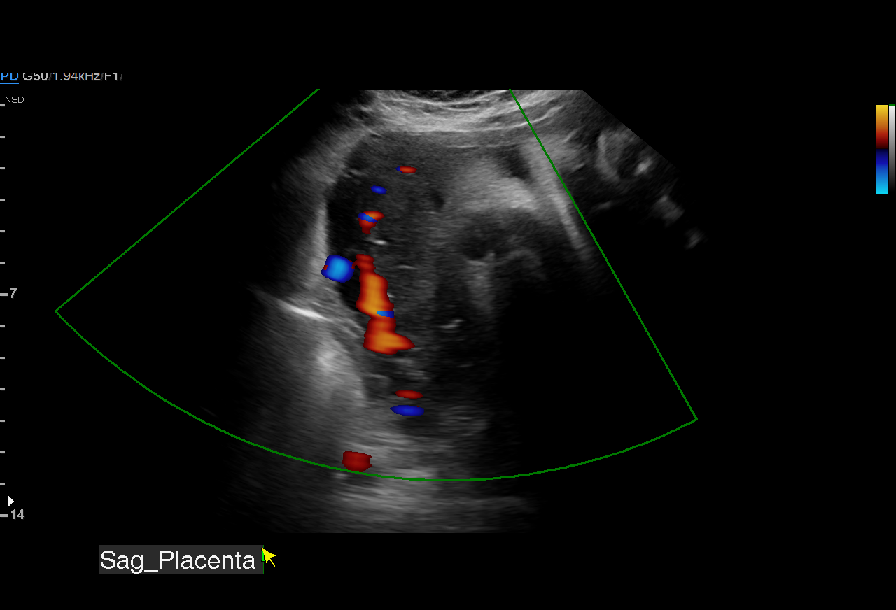
[im 4/10]
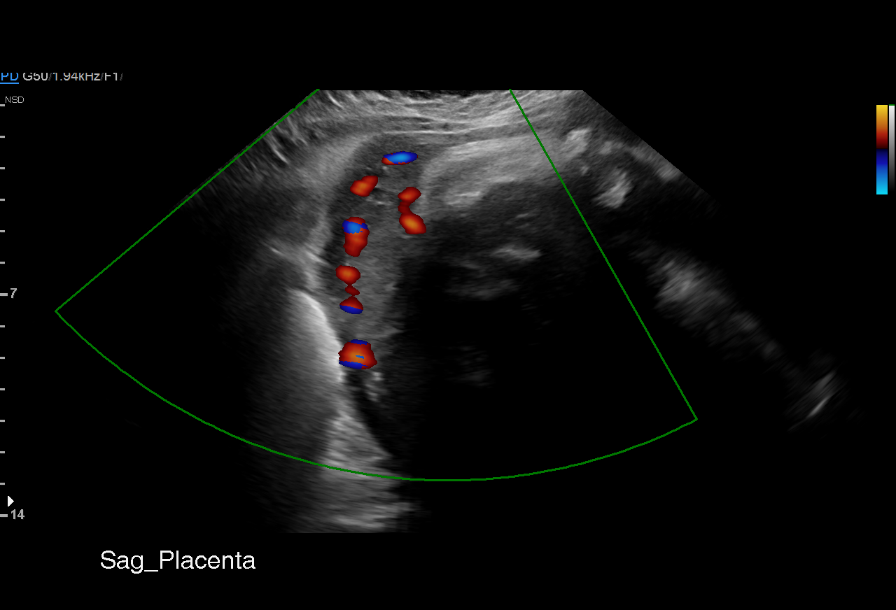
[im 5/10]
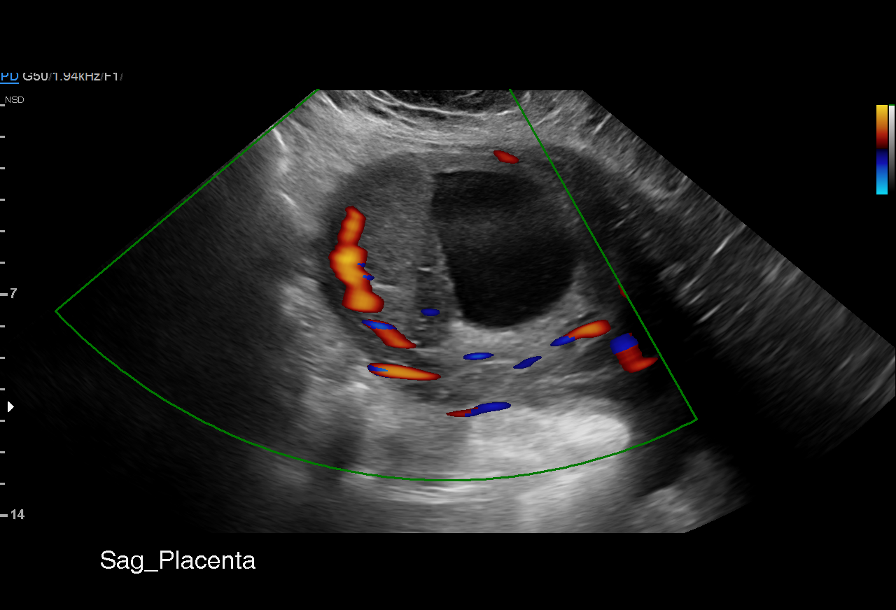
[im 6/10]
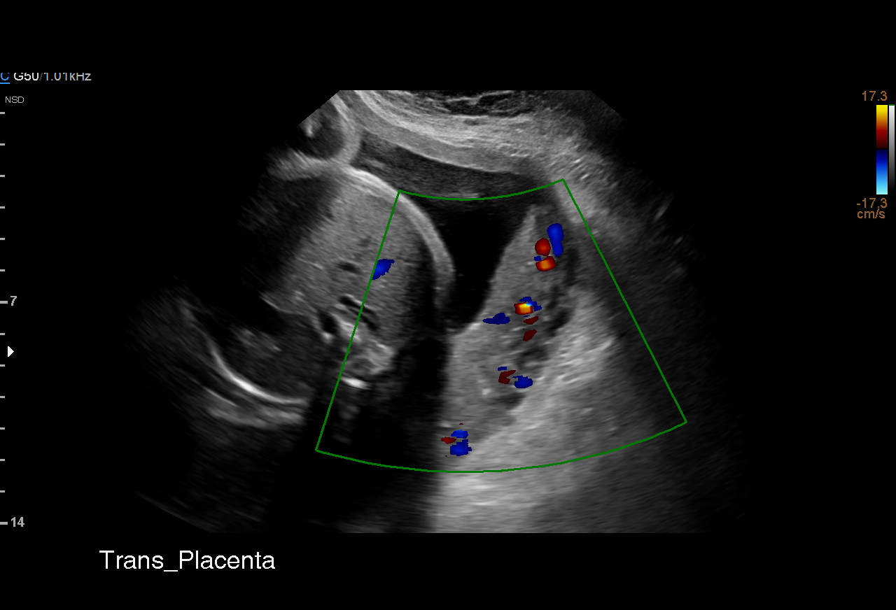
[im 7/10]
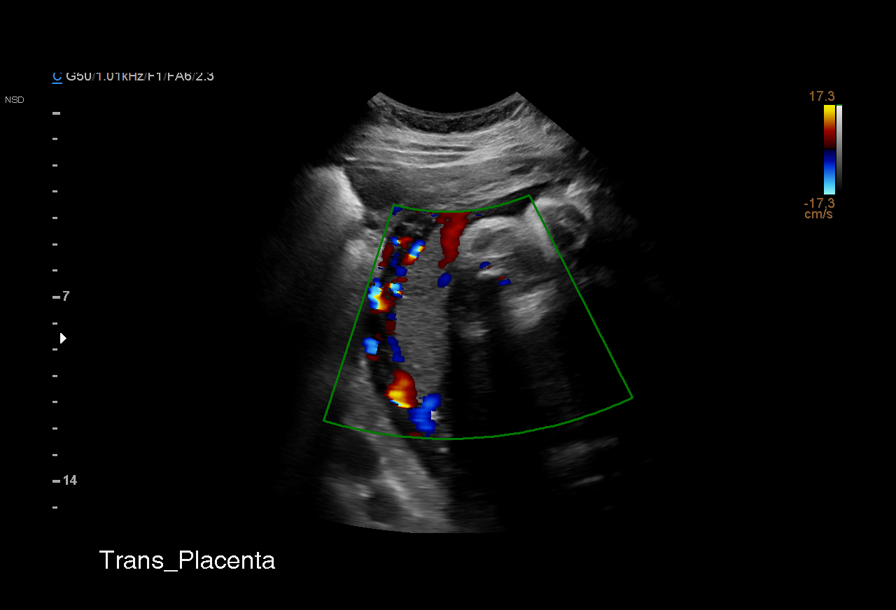
[im 8/10]
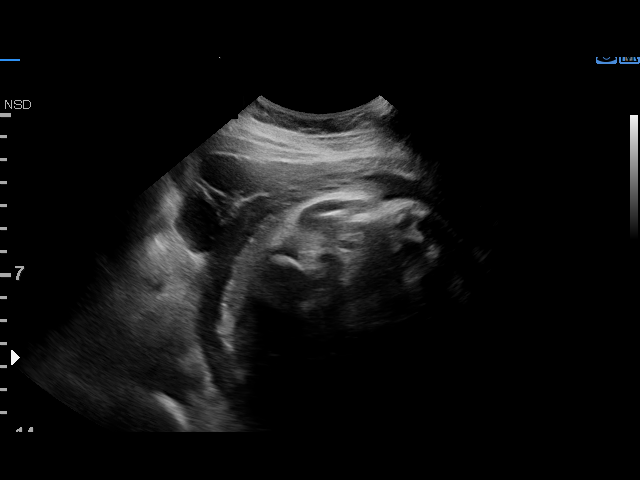
[im 9/10]
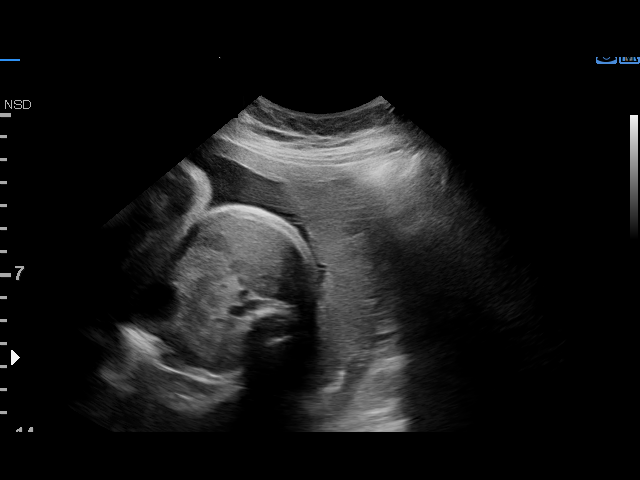
[im 10/10]
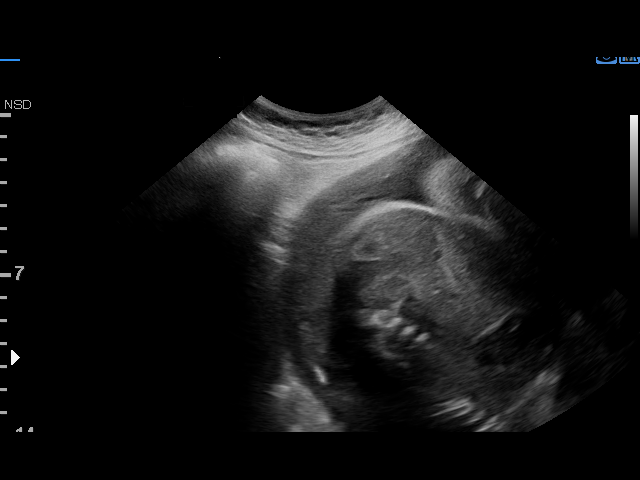

[10 of 10 positions shown; findings below may reference images not displayed]

Attending:        MFM Provider           Secondary Phy.:    L&D Nursing- L/D
[REDACTED]7

1  US MFM OB LIMITED                     [id number]    GIORGI JUMPER

Indications

33 weeks gestation of pregnancy
Vaginal bleeding in pregnancy, third trimester
Vital Signs

Height:        5'3"
Fetal Evaluation

Num Of Fetuses:          1
Fetal Heart Rate(bpm):   133
Cardiac Activity:        Observed
Presentation:            Cephalic
Placenta:                Posterior

Amniotic Fluid
AFI FV:      Within normal limits

AFI Sum(cm)     %Tile       Largest Pocket(cm)
16.51           60

RUQ(cm)       RLQ(cm)       LUQ(cm)        LLQ(cm)
3.93

Comment:    No placental abruption identified sonographically, however
portions of placenta obscured by fetal parts.
OB History

Gravidity:    3         Prem:   1         SAB:   1
Living:       1
Gestational Age

LMP:           33w 1d        Date:  05/21/17                 EDD:   02/25/18
Best:          33w 1d     Det. By:  LMP  (05/21/17)          EDD:   02/25/18
Cervix Uterus Adnexa

Cervix
Not visualized (advanced GA >16wks)
Comments

Ultrasound images and findings reviewed.  No evidence of
placental abruption is seen on today's U/S.  Cervix reported
as 3 cm dilated based on digital exam.  Please remember
that the absence of an abruption on U/S does not rule it out.

Recommendations:           1) Serial U/S every 4 weeks for
fetal growth 2) Close A-P surveillance
Recommendations

1) Serial U/S every 4 weeks for fetal growth 2) Close A-P
surveillance

## 2019-05-21 DIAGNOSIS — R52 Pain, unspecified: Secondary | ICD-10-CM | POA: Diagnosis not present

## 2019-05-21 DIAGNOSIS — Z20822 Contact with and (suspected) exposure to covid-19: Secondary | ICD-10-CM | POA: Diagnosis not present

## 2019-06-12 DIAGNOSIS — M545 Low back pain: Secondary | ICD-10-CM | POA: Diagnosis not present

## 2019-06-12 DIAGNOSIS — M546 Pain in thoracic spine: Secondary | ICD-10-CM | POA: Diagnosis not present

## 2019-06-12 DIAGNOSIS — G8929 Other chronic pain: Secondary | ICD-10-CM | POA: Diagnosis not present

## 2019-06-12 DIAGNOSIS — M5137 Other intervertebral disc degeneration, lumbosacral region: Secondary | ICD-10-CM | POA: Diagnosis not present

## 2019-06-12 DIAGNOSIS — F4312 Post-traumatic stress disorder, chronic: Secondary | ICD-10-CM | POA: Insufficient documentation

## 2019-07-06 DIAGNOSIS — G8929 Other chronic pain: Secondary | ICD-10-CM | POA: Diagnosis not present

## 2019-07-06 DIAGNOSIS — K2100 Gastro-esophageal reflux disease with esophagitis, without bleeding: Secondary | ICD-10-CM | POA: Diagnosis not present

## 2019-07-06 DIAGNOSIS — M545 Low back pain: Secondary | ICD-10-CM | POA: Diagnosis not present

## 2019-07-06 DIAGNOSIS — M546 Pain in thoracic spine: Secondary | ICD-10-CM | POA: Diagnosis not present

## 2019-07-06 DIAGNOSIS — F419 Anxiety disorder, unspecified: Secondary | ICD-10-CM | POA: Diagnosis not present

## 2019-07-12 DIAGNOSIS — K2100 Gastro-esophageal reflux disease with esophagitis, without bleeding: Secondary | ICD-10-CM | POA: Insufficient documentation

## 2019-07-12 HISTORY — DX: Gastro-esophageal reflux disease with esophagitis, without bleeding: K21.00

## 2019-08-30 DIAGNOSIS — Z20828 Contact with and (suspected) exposure to other viral communicable diseases: Secondary | ICD-10-CM | POA: Diagnosis not present

## 2019-09-28 DIAGNOSIS — Z20822 Contact with and (suspected) exposure to covid-19: Secondary | ICD-10-CM | POA: Diagnosis not present

## 2019-09-28 DIAGNOSIS — J019 Acute sinusitis, unspecified: Secondary | ICD-10-CM | POA: Diagnosis not present

## 2019-09-28 DIAGNOSIS — K219 Gastro-esophageal reflux disease without esophagitis: Secondary | ICD-10-CM | POA: Diagnosis not present

## 2019-12-17 DIAGNOSIS — Z6828 Body mass index (BMI) 28.0-28.9, adult: Secondary | ICD-10-CM | POA: Diagnosis not present

## 2019-12-17 DIAGNOSIS — L539 Erythematous condition, unspecified: Secondary | ICD-10-CM | POA: Diagnosis not present

## 2019-12-17 DIAGNOSIS — M545 Low back pain: Secondary | ICD-10-CM | POA: Diagnosis not present

## 2019-12-17 DIAGNOSIS — R21 Rash and other nonspecific skin eruption: Secondary | ICD-10-CM | POA: Diagnosis not present

## 2020-04-29 DIAGNOSIS — U071 COVID-19: Secondary | ICD-10-CM | POA: Diagnosis not present

## 2020-07-13 DIAGNOSIS — S134XXA Sprain of ligaments of cervical spine, initial encounter: Secondary | ICD-10-CM | POA: Diagnosis not present

## 2020-07-13 DIAGNOSIS — M62830 Muscle spasm of back: Secondary | ICD-10-CM | POA: Diagnosis not present

## 2020-07-15 ENCOUNTER — Encounter (HOSPITAL_COMMUNITY): Payer: Self-pay

## 2020-07-15 ENCOUNTER — Emergency Department (HOSPITAL_COMMUNITY)
Admission: EM | Admit: 2020-07-15 | Discharge: 2020-07-15 | Disposition: A | Discharge status: Home / Self Care | Payer: Medicaid Other | Attending: Emergency Medicine | Admitting: Emergency Medicine

## 2020-07-15 ENCOUNTER — Other Ambulatory Visit: Payer: Self-pay

## 2020-07-15 DIAGNOSIS — Z5321 Procedure and treatment not carried out due to patient leaving prior to being seen by health care provider: Secondary | ICD-10-CM | POA: Diagnosis not present

## 2020-07-15 DIAGNOSIS — Y92410 Unspecified street and highway as the place of occurrence of the external cause: Secondary | ICD-10-CM | POA: Diagnosis not present

## 2020-07-15 DIAGNOSIS — M545 Low back pain, unspecified: Secondary | ICD-10-CM | POA: Diagnosis not present

## 2020-07-15 NOTE — ED Notes (Signed)
Patient had to leave because child got sick at daycare
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
## 2020-08-12 DIAGNOSIS — M25552 Pain in left hip: Secondary | ICD-10-CM | POA: Diagnosis not present

## 2020-08-12 DIAGNOSIS — R5381 Other malaise: Secondary | ICD-10-CM | POA: Diagnosis not present

## 2020-08-12 DIAGNOSIS — H00023 Hordeolum internum right eye, unspecified eyelid: Secondary | ICD-10-CM | POA: Diagnosis not present

## 2020-08-12 DIAGNOSIS — Z6828 Body mass index (BMI) 28.0-28.9, adult: Secondary | ICD-10-CM | POA: Diagnosis not present

## 2020-08-12 DIAGNOSIS — M545 Low back pain, unspecified: Secondary | ICD-10-CM | POA: Diagnosis not present

## 2020-09-23 DIAGNOSIS — R3915 Urgency of urination: Secondary | ICD-10-CM | POA: Diagnosis not present

## 2020-09-23 DIAGNOSIS — N3001 Acute cystitis with hematuria: Secondary | ICD-10-CM | POA: Diagnosis not present

## 2020-10-10 DIAGNOSIS — R5381 Other malaise: Secondary | ICD-10-CM | POA: Diagnosis not present

## 2020-10-10 DIAGNOSIS — E559 Vitamin D deficiency, unspecified: Secondary | ICD-10-CM | POA: Diagnosis not present

## 2020-10-10 DIAGNOSIS — Z0001 Encounter for general adult medical examination with abnormal findings: Secondary | ICD-10-CM | POA: Diagnosis not present

## 2020-10-10 DIAGNOSIS — E785 Hyperlipidemia, unspecified: Secondary | ICD-10-CM | POA: Diagnosis not present

## 2020-10-10 DIAGNOSIS — Z6827 Body mass index (BMI) 27.0-27.9, adult: Secondary | ICD-10-CM | POA: Diagnosis not present

## 2020-10-14 DIAGNOSIS — H00023 Hordeolum internum right eye, unspecified eyelid: Secondary | ICD-10-CM | POA: Diagnosis not present

## 2020-10-14 DIAGNOSIS — D509 Iron deficiency anemia, unspecified: Secondary | ICD-10-CM | POA: Diagnosis not present

## 2020-10-14 DIAGNOSIS — Z6827 Body mass index (BMI) 27.0-27.9, adult: Secondary | ICD-10-CM | POA: Diagnosis not present

## 2020-10-14 DIAGNOSIS — R590 Localized enlarged lymph nodes: Secondary | ICD-10-CM | POA: Diagnosis not present

## 2020-10-23 DIAGNOSIS — Z4802 Encounter for removal of sutures: Secondary | ICD-10-CM | POA: Diagnosis not present

## 2020-10-23 DIAGNOSIS — Z7189 Other specified counseling: Secondary | ICD-10-CM | POA: Diagnosis not present

## 2020-10-23 DIAGNOSIS — R5381 Other malaise: Secondary | ICD-10-CM | POA: Diagnosis not present

## 2020-10-23 DIAGNOSIS — M545 Low back pain, unspecified: Secondary | ICD-10-CM | POA: Diagnosis not present

## 2020-10-23 DIAGNOSIS — R590 Localized enlarged lymph nodes: Secondary | ICD-10-CM | POA: Diagnosis not present

## 2021-03-18 DIAGNOSIS — M25562 Pain in left knee: Secondary | ICD-10-CM | POA: Diagnosis not present

## 2021-03-19 DIAGNOSIS — Z043 Encounter for examination and observation following other accident: Secondary | ICD-10-CM | POA: Diagnosis not present

## 2021-04-09 DIAGNOSIS — I10 Essential (primary) hypertension: Secondary | ICD-10-CM | POA: Diagnosis not present

## 2021-04-09 DIAGNOSIS — R0989 Other specified symptoms and signs involving the circulatory and respiratory systems: Secondary | ICD-10-CM | POA: Diagnosis not present

## 2021-04-09 DIAGNOSIS — R509 Fever, unspecified: Secondary | ICD-10-CM | POA: Diagnosis not present

## 2021-04-09 DIAGNOSIS — R739 Hyperglycemia, unspecified: Secondary | ICD-10-CM | POA: Diagnosis not present

## 2021-04-09 DIAGNOSIS — I959 Hypotension, unspecified: Secondary | ICD-10-CM | POA: Diagnosis not present

## 2021-05-06 ENCOUNTER — Encounter: Payer: Self-pay | Admitting: Nurse Practitioner

## 2021-05-06 ENCOUNTER — Other Ambulatory Visit: Payer: Self-pay

## 2021-05-06 ENCOUNTER — Ambulatory Visit: Payer: Medicaid Other | Admitting: Nurse Practitioner

## 2021-05-06 VITALS — BP 140/78 | HR 83 | Temp 97.3°F | Ht 64.0 in | Wt 157.0 lb

## 2021-05-06 DIAGNOSIS — G8929 Other chronic pain: Secondary | ICD-10-CM | POA: Diagnosis not present

## 2021-05-06 DIAGNOSIS — E538 Deficiency of other specified B group vitamins: Secondary | ICD-10-CM | POA: Diagnosis not present

## 2021-05-06 DIAGNOSIS — R002 Palpitations: Secondary | ICD-10-CM | POA: Diagnosis not present

## 2021-05-06 DIAGNOSIS — F418 Other specified anxiety disorders: Secondary | ICD-10-CM

## 2021-05-06 DIAGNOSIS — R5383 Other fatigue: Secondary | ICD-10-CM

## 2021-05-06 DIAGNOSIS — E042 Nontoxic multinodular goiter: Secondary | ICD-10-CM

## 2021-05-06 DIAGNOSIS — R110 Nausea: Secondary | ICD-10-CM

## 2021-05-06 DIAGNOSIS — Z833 Family history of diabetes mellitus: Secondary | ICD-10-CM | POA: Diagnosis not present

## 2021-05-06 DIAGNOSIS — M5440 Lumbago with sciatica, unspecified side: Secondary | ICD-10-CM

## 2021-05-06 DIAGNOSIS — Z8249 Family history of ischemic heart disease and other diseases of the circulatory system: Secondary | ICD-10-CM | POA: Diagnosis not present

## 2021-05-06 DIAGNOSIS — F17218 Nicotine dependence, cigarettes, with other nicotine-induced disorders: Secondary | ICD-10-CM

## 2021-05-06 DIAGNOSIS — K219 Gastro-esophageal reflux disease without esophagitis: Secondary | ICD-10-CM

## 2021-05-06 MED ORDER — CYANOCOBALAMIN 1000 MCG/ML IJ KIT: 1.0000 mL | PACK | INTRAMUSCULAR | 1 refills | Status: DC

## 2021-05-06 MED ORDER — ONDANSETRON HCL 4 MG PO TABS: 4.0000 mg | ORAL_TABLET | Freq: Three times a day (TID) | ORAL | 0 refills | Status: DC | PRN

## 2021-05-06 MED ORDER — PANTOPRAZOLE SODIUM 40 MG PO TBEC: 40.0000 mg | DELAYED_RELEASE_TABLET | Freq: Every day | ORAL | 3 refills | Status: DC

## 2021-05-06 MED ORDER — ESCITALOPRAM OXALATE 10 MG PO TABS: 10.0000 mg | ORAL_TABLET | Freq: Every day | ORAL | 2 refills | Status: DC

## 2021-05-06 NOTE — Progress Notes (Signed)
New Patient Office Visit  Subjective:  Patient ID: [name] Moreno, female    DOB: 11-14-1980  Age: 41 y.o. MRN: [id number]  CC:  Chief Complaint  Patient presents with   Establish Care    HPI [name] Moreno "[name] Moreno" is a 41 year old African-American female that presents to establish care.  She has a past medical history of GERD, chronic back pain, thyroid nodules, anxiety with depression. States she has chronic fatigue. She has a history of B12 deficiency anemia. She is a long-time cigarette smoker, quit when she was pregnant 3 years ago. She has declined smoking cessation pharmacotherapy assistance today. States she is having increased stress in her life due to a family situation with her 57 year old son. States she is experiencing palpitations with mild dyspnea.    Past Medical History:  Diagnosis Date   Depression    Headache    Heart murmur    Multinodular goiter    Thyroid nodule     Past Surgical History:  Procedure Laterality Date   CESAREAN SECTION N/A 01/24/2018   Procedure: CESAREAN SECTION;  Surgeon: Sloan Leiter, MD;  Location: [facility];  Service: Obstetrics;  Laterality: N/A;    Family History  Problem Relation Age of Onset   Hypertension Other        Grandparents   Diabetes Other        Grandparents    Social History   Socioeconomic History   Marital status: Single    Spouse name: Not on file   Number of children: 2   Years of education: 12   Highest education level: Not on file  Occupational History   Occupation: CNA  Tobacco Use   Smoking status: Former    Packs/day: 0.30    Types: Cigarettes    Start date: 07/01/2017   Smokeless tobacco: Never  Substance and Sexual Activity   Alcohol use: Yes    Comment: occas. when not preg   Drug use: No   Sexual activity: Yes    Partners: Male    Birth control/protection: None, Injection  Other Topics Concern   Not on file  Social History Narrative   Not on file   Social  Determinants of Health   Financial Resource Strain: Not on file  Food Insecurity: Not on file  Transportation Needs: Not on file  Physical Activity: Not on file  Stress: Not on file  Social Connections: Not on file  Intimate Partner Violence: Not on file    ROS Review of Systems  Constitutional:  Positive for fatigue. Negative for chills and fever.  HENT:  Negative for congestion, ear pain, rhinorrhea and sore throat.   Respiratory:  Negative for cough and shortness of breath.   Cardiovascular:  Positive for palpitations. Negative for chest pain.  Gastrointestinal:  Negative for abdominal pain, constipation, diarrhea, nausea and vomiting.  Genitourinary:  Negative for dysuria and urgency.  Musculoskeletal:  Positive for back pain. Negative for myalgias.  Skin: Negative.   Neurological:  Positive for numbness (left finger tips). Negative for dizziness, weakness, light-headedness and headaches.  Psychiatric/Behavioral:  Negative for dysphoric mood. The patient is nervous/anxious.    Objective:   Today's Vitals: Pulse 83    Temp (!) 97.3 F (36.3 C)    Ht 5' 4"  (1.626 m)    Wt 157 lb (71.2 kg)    SpO2 100%    BMI 26.95 kg/m  BP 140/78    Pulse 83    Temp (!) 97.3 F (36.3 C)  Ht 5' 4"  (1.626 m)    Wt 157 lb (71.2 kg)    SpO2 100%    BMI 26.95 kg/m    Physical Exam Vitals reviewed.  Constitutional:      Appearance: Normal appearance.  HENT:     Head: Normocephalic.     Right Ear: Tympanic membrane normal.     Left Ear: Tympanic membrane normal.     Nose: Nose normal.     Mouth/Throat:     Mouth: Mucous membranes are moist.  Eyes:     Pupils: Pupils are equal, round, and reactive to light.  Cardiovascular:     Rate and Rhythm: Normal rate and regular rhythm.     Pulses: Normal pulses.     Heart sounds: Normal heart sounds.  Pulmonary:     Effort: Pulmonary effort is normal.     Breath sounds: Normal breath sounds.  Abdominal:     General: Bowel sounds are normal.      Palpations: Abdomen is soft.  Musculoskeletal:        General: Normal range of motion.     Cervical back: Neck supple.  Skin:    General: Skin is warm and dry.     Capillary Refill: Capillary refill takes less than 2 seconds.  Neurological:     General: No focal deficit present.     Mental Status: She is alert and oriented to person, place, and time.  Psychiatric:        Mood and Affect: Mood normal.        Behavior: Behavior normal.    Assessment & Plan:   1. Gastroesophageal reflux disease, unspecified whether esophagitis present - pantoprazole (PROTONIX) 40 MG tablet; Take 1 tablet (40 mg total) by mouth daily.  Dispense: 30 tablet; Refill: 3  2. Anxiety with depression - CBC with Differential/Platelet; Future - Comprehensive metabolic panel; Future - Lipid panel; Future - TSH; Future - VITAMIN D 25 Hydroxy (Vit-D Deficiency, Fractures); Future - escitalopram (LEXAPRO) 10 MG tablet; Take 1 tablet (10 mg total) by mouth daily.  Dispense: 30 tablet; Refill: 2  3. Other fatigue - CBC with Differential/Platelet; Future - Comprehensive metabolic panel; Future - Lipid panel; Future - TSH; Future - VITAMIN D 25 Hydroxy (Vit-D Deficiency, Fractures); Future  4. Chronic left-sided low back pain with sciatica, sciatica laterality unspecified - Ambulatory referral to Spine Surgery  5. Multiple thyroid nodules - CBC with Differential/Platelet; Future - Comprehensive metabolic panel; Future - TSH; Future  6. Family history of diabetes mellitus type II father - CBC with Differential/Platelet; Future - Comprehensive metabolic panel; Future  7. Family history of hypertension in mother - CBC with Differential/Platelet; Future - Comprehensive metabolic panel; Future - Lipid panel; Future - TSH; Future  8. B12 deficiency - CBC with Differential/Platelet; Future - Cyanocobalamin 1000 MCG/ML KIT; Inject 1 mL as directed once a week. 1 ml injection weekly for four weeks, then  monthly  Dispense: 4 kit; Refill: 1  9. Nausea - ondansetron (ZOFRAN) 4 MG tablet; Take 1 tablet (4 mg total) by mouth every 8 (eight) hours as needed for nausea or vomiting.  Dispense: 30 tablet; Refill: 0  10. Cigarette nicotine dependence with other nicotine-induced disorder -smoking cessation encouraged  11. Palpitations - EKG 12-Lead        Begin Protonix 40 mg daily Begin Lexapro 10 mg daily  Begin B12 injections weekly for four weeks, then monthly We will call you with lab results and referral to spine specialist Follow-up in  4-weeks    Follow-up:  05/12/21 for labs, B12 shot; 4-week follow-up depression/GERD   I, Rip Harbour, NP, have reviewed all documentation for this visit. The documentation on 05/06/21 for the exam, diagnosis, procedures, and orders are all accurate and complete.    Signed,  Jerrell Belfast, DNP

## 2021-05-06 NOTE — Patient Instructions (Addendum)
Begin Protonix 40 mg daily Begin Lexapro 10 mg daily  Begin B12 injections weekly for four weeks, then monthly We will call you with lab results and referral to spine specialist Follow-up in 4-weeks Back Exercises These exercises help to make your trunk and back strong. They also help to keep the lower back flexible. Doing these exercises can help to prevent or lessen pain in your lower back. If you have back pain, try to do these exercises 2-3 times each day or as told by your doctor. As you get better, do the exercises once each day. Repeat the exercises more often as told by your doctor. To stop back pain from coming back, do the exercises once each day, or as told by your doctor. Do exercises exactly as told by your doctor. Stop right away if you feel sudden pain or your pain gets worse. Exercises Single knee to chest Do these steps 3-5 times in a row for each leg: Lie on your back on a firm bed or the floor with your legs stretched out. Bring one knee to your chest. Grab your knee or thigh with both hands and hold it in place. Pull on your knee until you feel a gentle stretch in your lower back or butt. Keep doing the stretch for 10-30 seconds. Slowly let go of your leg and straighten it. Pelvic tilt Do these steps 5-10 times in a row: Lie on your back on a firm bed or the floor with your legs stretched out. Bend your knees so they point up to the ceiling. Your feet should be flat on the floor. Tighten your lower belly (abdomen) muscles to press your lower back against the floor. This will make your tailbone point up to the ceiling instead of pointing down to your feet or the floor. Stay in this position for 5-10 seconds while you gently tighten your muscles and breathe evenly. Cat-cow Do these steps until your lower back bends more easily: Get on your hands and knees on a firm bed or the floor. Keep your hands under your shoulders, and keep your knees under your hips. You may put  padding under your knees. Let your head hang down toward your chest. Tighten (contract) the muscles in your belly. Point your tailbone toward the floor so your lower back becomes rounded like the back of a cat. Stay in this position for 5 seconds. Slowly lift your head. Let the muscles of your belly relax. Point your tailbone up toward the ceiling so your back forms a sagging arch like the back of a cow. Stay in this position for 5 seconds.  Press-ups Do these steps 5-10 times in a row: Lie on your belly (face-down) on a firm bed or the floor. Place your hands near your head, about shoulder-width apart. While you keep your back relaxed and keep your hips on the floor, slowly straighten your arms to raise the top half of your body and lift your shoulders. Do not use your back muscles. You may change where you place your hands to make yourself more comfortable. Stay in this position for 5 seconds. Keep your back relaxed. Slowly return to lying flat on the floor.  Bridges Do these steps 10 times in a row: Lie on your back on a firm bed or the floor. Bend your knees so they point up to the ceiling. Your feet should be flat on the floor. Your arms should be flat at your sides, next to your body. Tighten  your butt muscles and lift your butt off the floor until your waist is almost as high as your knees. If you do not feel the muscles working in your butt and the back of your thighs, slide your feet 1-2 inches (2.5-5 cm) farther away from your butt. Stay in this position for 3-5 seconds. Slowly lower your butt to the floor, and let your butt muscles relax. If this exercise is too easy, try doing it with your arms crossed over your chest. Belly crunches Do these steps 5-10 times in a row: Lie on your back on a firm bed or the floor with your legs stretched out. Bend your knees so they point up to the ceiling. Your feet should be flat on the floor. Cross your arms over your chest. Tip your chin a  little bit toward your chest, but do not bend your neck. Tighten your belly muscles and slowly raise your chest just enough to lift your shoulder blades a tiny bit off the floor. Avoid raising your body higher than that because it can put too much stress on your lower back. Slowly lower your chest and your head to the floor. Back lifts Do these steps 5-10 times in a row: Lie on your belly (face-down) with your arms at your sides, and rest your forehead on the floor. Tighten the muscles in your legs and your butt. Slowly lift your chest off the floor while you keep your hips on the floor. Keep the back of your head in line with the curve in your back. Look at the floor while you do this. Stay in this position for 3-5 seconds. Slowly lower your chest and your face to the floor. Contact a doctor if: Your back pain gets a lot worse when you do an exercise. Your back pain does not get better within 2 hours after you exercise. If you have any of these problems, stop doing the exercises. Do not do them again unless your doctor says it is okay. Get help right away if: You have sudden, very bad back pain. If this happens, stop doing the exercises. Do not do them again unless your doctor says it is okay. This information is not intended to replace advice given to you by your health care provider. Make sure you discuss any questions you have with your health care provider. Document Revised: 06/18/2020 Document Reviewed: 06/18/2020 Elsevier Patient Education  2022 [employer].     Food Choices for Gastroesophageal Reflux Disease, Adult When you have gastroesophageal reflux disease (GERD), the foods you eat and your eating habits are very important. Choosing the right foods can help ease your discomfort. Think about working with a food expert (dietitian) to help you make good choices. What are tips for following this plan? Reading food labels Look for foods that are low in saturated fat. Foods that  may help with your symptoms include: Foods that have less than 5% of daily value (DV) of fat. Foods that have 0 grams of trans fat. Cooking Do not fry your food. Cook your food by baking, steaming, grilling, or broiling. These are all methods that do not need a lot of fat for cooking. To add flavor, try to use herbs that are low in spice and acidity. Meal planning  Choose healthy foods that are low in fat, such as: Fruits and vegetables. Whole grains. Low-fat dairy products. Lean meats, fish, and poultry. Eat small meals often instead of eating 3 large meals each day. Eat your  meals slowly in a place where you are relaxed. Avoid bending over or lying down until 2-3 hours after eating. Limit high-fat foods such as fatty meats or fried foods. Limit your intake of fatty foods, such as oils, butter, and shortening. Avoid the following as told by your doctor: Foods that cause symptoms. These may be different for different people. Keep a food diary to keep track of foods that cause symptoms. Alcohol. Drinking a lot of liquid with meals. Eating meals during the 2-3 hours before bed. Lifestyle Stay at a healthy weight. Ask your doctor what weight is healthy for you. If you need to lose weight, work with your doctor to do so safely. Exercise for at least 30 minutes on 5 or more days each week, or as told by your doctor. Wear loose-fitting clothes. Do not smoke or use any products that contain nicotine or tobacco. If you need help quitting, ask your doctor. Sleep with the head of your bed higher than your feet. Use a wedge under the mattress or blocks under the bed frame to raise the head of the bed. Chew sugar-free gum after meals. What foods should eat? Eat a healthy, well-balanced diet of fruits, vegetables, whole grains, low-fat dairy products, lean meats, fish, and poultry. Each person is different. Foods that may cause symptoms in one person may not cause any symptoms in another person.  Work with your doctor to find foods that are safe for you. The items listed above may not be a complete list of what you can eat and drink. Contact a food expert for more options. What foods should I avoid? Limiting some of these foods may help in managing the symptoms of GERD. Everyone is different. Talk with a food expert or your doctor to help you find the exact foods to avoid, if any. Fruits Any fruits prepared with added fat. Any fruits that cause symptoms. For some people, this may include citrus fruits, such as oranges, grapefruit, pineapple, and lemons. Vegetables Deep-fried vegetables. [place] fries. Any vegetables prepared with added fat. Any vegetables that cause symptoms. For some people, this may include tomatoes and tomato products, chili peppers, onions and garlic, and horseradish. Grains Pastries or quick breads with added fat. Meats and other proteins High-fat meats, such as fatty beef or pork, hot dogs, ribs, ham, sausage, salami, and bacon. Fried meat or protein, including fried fish and fried chicken. Nuts and nut butters, in large amounts. Dairy Whole milk and chocolate milk. Sour cream. Cream. Ice cream. Cream cheese. Milkshakes. Fats and oils Butter. Margarine. Shortening. Ghee. Beverages Coffee and tea, with or without caffeine. Carbonated beverages. Sodas. Energy drinks. Fruit juice made with acidic fruits, such as orange or grapefruit. Tomato juice. Alcoholic drinks. Sweets and desserts Chocolate and cocoa. Donuts. Seasonings and condiments Pepper. Peppermint and spearmint. Added salt. Any condiments, herbs, or seasonings that cause symptoms. For some people, this may include curry, hot sauce, or vinegar-based salad dressings. The items listed above may not be a complete list of what you should not eat and drink. Contact a food expert for more options. Questions to ask your doctor Diet and lifestyle changes are often the first steps that are taken to manage symptoms  of GERD. If diet and lifestyle changes do not help, talk with your doctor about taking medicines. Where to find more information International Foundation for Gastrointestinal Disorders: aboutgerd.org Summary When you have GERD, food and lifestyle choices are very important in easing your symptoms. Eat small meals often instead  of 3 large meals a day. Eat your meals slowly and in a place where you are relaxed. Avoid bending over or lying down until 2-3 hours after eating. Limit high-fat foods such as fatty meats or fried foods. This information is not intended to replace advice given to you by your health care provider. Make sure you discuss any questions you have with your health care provider. Document Revised: 10/15/2019 Document Reviewed: 10/15/2019 Elsevier Patient Education  2022 Elsevier Inc.     Managing Anxiety, Adult After being diagnosed with anxiety, you may be relieved to know why you have felt or behaved a certain way. You may also feel overwhelmed about the treatment ahead and what it will mean for your life. With care and support, you can manage this condition. How to manage lifestyle changes Managing stress and anxiety Stress is your body's reaction to life changes and events, both good and bad. Most stress will last just a few hours, but stress can be ongoing and can lead to more than just stress. Although stress can play a major role in anxiety, it is not the same as anxiety. Stress is usually caused by something external, such as a deadline, test, or competition. Stress normally passes after the triggering event has ended.  Anxiety is caused by something internal, such as imagining a terrible outcome or worrying that something will go wrong that will devastate you. Anxiety often does not go away even after the triggering event is over, and it can become long-term (chronic) worry. It is important to understand the differences between stress and anxiety and to manage your stress  effectively so that it does not lead to an anxious response. Talk with your health care provider or a counselor to learn more about reducing anxiety and stress. He or she may suggest tension reduction techniques, such as: Music therapy. Spend time creating or listening to music that you enjoy and that inspires you. Mindfulness-based meditation. Practice being aware of your normal breaths while not trying to control your breathing. It can be done while sitting or walking. Centering prayer. This involves focusing on a word, phrase, or sacred image that means something to you and brings you peace. Deep breathing. To do this, expand your stomach and inhale slowly through your nose. Hold your breath for 3-5 seconds. Then exhale slowly, letting your stomach muscles relax. Self-talk. Learn to notice and identify thought patterns that lead to anxiety reactions and change those patterns to thoughts that feel peaceful. Muscle relaxation. Taking time to tense muscles and then relax them. Choose a tension reduction technique that fits your lifestyle and personality. These techniques take time and practice. Set aside 5-15 minutes a day to do them. Therapists can offer counseling and training in these techniques. The training to help with anxiety may be covered by some insurance plans. Other things you can do to manage stress and anxiety include: Keeping a stress diary. This can help you learn what triggers your reaction and then learn ways to manage your response. Thinking about how you react to certain situations. You may not be able to control everything, but you can control your response. Making time for activities that help you relax and not feeling guilty about spending your time in this way. Doing visual imagery. This involves imagining or creating mental pictures to help you relax. Practicing yoga. Through yoga poses, you can lower tension and promote relaxation.  Medicines Medicines can help ease  symptoms. Medicines for anxiety include: Antidepressant medicines. These  are usually prescribed for long-term daily control. Anti-anxiety medicines. These may be added in severe cases, especially when panic attacks occur. Medicines will be prescribed by a health care provider. When used together, medicines, psychotherapy, and tension reduction techniques may be the most effective treatment. Relationships Relationships can play a big part in helping you recover. Try to spend more time connecting with trusted friends and family members. Consider going to couples counseling if you have a partner, taking family education classes, or going to family therapy. Therapy can help you and others better understand your condition. How to recognize changes in your anxiety Everyone responds differently to treatment for anxiety. Recovery from anxiety happens when symptoms decrease and stop interfering with your daily activities at home or work. This may mean that you will start to: Have better concentration and focus. Worry will interfere less in your daily thinking. Sleep better. Be less irritable. Have more energy. Have improved memory. It is also important to recognize when your condition is getting worse. Contact your health care provider if your symptoms interfere with home or work and you feel like your condition is not improving. Follow these instructions at home: Activity Exercise. Adults should do the following: Exercise for at least 150 minutes each week. The exercise should increase your heart rate and make you sweat (moderate-intensity exercise). Strengthening exercises at least twice a week. Get the right amount and quality of sleep. Most adults need 7-9 hours of sleep each night. Lifestyle  Eat a healthy diet that includes plenty of vegetables, fruits, whole grains, low-fat dairy products, and lean protein. Do not eat a lot of foods that are high in fats, added sugars, or salt (sodium). Make  choices that simplify your life. Do not use any products that contain nicotine or tobacco. These products include cigarettes, chewing tobacco, and vaping devices, such as e-cigarettes. If you need help quitting, ask your health care provider. Avoid caffeine, alcohol, and certain over-the-counter cold medicines. These may make you feel worse. Ask your pharmacist which medicines to avoid. General instructions Take over-the-counter and prescription medicines only as told by your health care provider. Keep all follow-up visits. This is important. Where to find support You can get help and support from these sources: Self-help groups. Online and [employer]. A trusted spiritual leader. Couples counseling. Family education classes. Family therapy. Where to find more information You may find that joining a support group helps you deal with your anxiety. The following sources can help you locate counselors or support groups near you: Mental Health America: www.mentalhealthamerica.net Anxiety and Depression Association of [place] (ADAA): ProgramCam.de [employer] on Mental Illness (NAMI): www.nami.org Contact a health care provider if: You have a hard time staying focused or finishing daily tasks. You spend many hours a day feeling worried about everyday life. You become exhausted by worry. You start to have headaches or frequently feel tense. You develop chronic nausea or diarrhea. Get help right away if: You have a racing heart and shortness of breath. You have thoughts of hurting yourself or others. If you ever feel like you may hurt yourself or others, or have thoughts about taking your own life, get help right away. Go to your nearest emergency department or: Call your local emergency services (911 in the [place] Call a suicide crisis helpline, such as the National Suicide Prevention Lifeline at (803)249-7258 or 988 in the [place] This is open 24 hours a day in the [place] Text  the Crisis Text Line at 919-184-7482 (in the  [place] Summary Taking steps to learn and use tension reduction techniques can help calm you and help prevent triggering an anxiety reaction. When used together, medicines, psychotherapy, and tension reduction techniques may be the most effective treatment. Family, friends, and partners can play a big part in supporting you. This information is not intended to replace advice given to you by your health care provider. Make sure you discuss any questions you have with your health care provider. Document Revised: 10/29/2020 Document Reviewed: 07/27/2020 Elsevier Patient Education  2022 [employer]

## 2021-05-12 ENCOUNTER — Other Ambulatory Visit: Payer: Medicaid Other

## 2021-05-19 ENCOUNTER — Other Ambulatory Visit: Payer: Medicaid Other

## 2021-05-25 ENCOUNTER — Other Ambulatory Visit: Payer: Self-pay | Admitting: Nurse Practitioner

## 2021-05-25 DIAGNOSIS — E538 Deficiency of other specified B group vitamins: Secondary | ICD-10-CM

## 2021-05-29 ENCOUNTER — Other Ambulatory Visit: Payer: Self-pay | Admitting: Nurse Practitioner

## 2021-05-29 DIAGNOSIS — F418 Other specified anxiety disorders: Secondary | ICD-10-CM

## 2021-05-31 ENCOUNTER — Other Ambulatory Visit: Payer: Self-pay | Admitting: Nurse Practitioner

## 2021-05-31 DIAGNOSIS — F418 Other specified anxiety disorders: Secondary | ICD-10-CM

## 2021-05-31 DIAGNOSIS — K5901 Slow transit constipation: Secondary | ICD-10-CM

## 2021-05-31 DIAGNOSIS — F419 Anxiety disorder, unspecified: Secondary | ICD-10-CM

## 2021-05-31 MED ORDER — ESCITALOPRAM OXALATE 10 MG PO TABS: 10.0000 mg | ORAL_TABLET | Freq: Every day | ORAL | 2 refills | Status: DC

## 2021-06-02 ENCOUNTER — Ambulatory Visit: Payer: Medicaid Other | Admitting: Nurse Practitioner

## 2021-08-02 ENCOUNTER — Other Ambulatory Visit: Payer: Self-pay | Admitting: Nurse Practitioner

## 2021-08-02 DIAGNOSIS — K219 Gastro-esophageal reflux disease without esophagitis: Secondary | ICD-10-CM

## 2022-01-08 DIAGNOSIS — Z79899 Other long term (current) drug therapy: Secondary | ICD-10-CM | POA: Diagnosis not present

## 2022-01-08 DIAGNOSIS — R5383 Other fatigue: Secondary | ICD-10-CM | POA: Diagnosis not present

## 2022-01-08 DIAGNOSIS — E559 Vitamin D deficiency, unspecified: Secondary | ICD-10-CM | POA: Diagnosis not present

## 2022-01-13 DIAGNOSIS — M5442 Lumbago with sciatica, left side: Secondary | ICD-10-CM | POA: Diagnosis not present

## 2022-01-13 DIAGNOSIS — M5441 Lumbago with sciatica, right side: Secondary | ICD-10-CM | POA: Diagnosis not present

## 2022-01-15 DIAGNOSIS — Z79899 Other long term (current) drug therapy: Secondary | ICD-10-CM | POA: Diagnosis not present

## 2022-01-19 DIAGNOSIS — Z79899 Other long term (current) drug therapy: Secondary | ICD-10-CM | POA: Diagnosis not present

## 2022-02-12 DIAGNOSIS — Z79899 Other long term (current) drug therapy: Secondary | ICD-10-CM | POA: Diagnosis not present

## 2022-02-16 DIAGNOSIS — Z79899 Other long term (current) drug therapy: Secondary | ICD-10-CM | POA: Diagnosis not present

## 2022-03-03 DIAGNOSIS — K219 Gastro-esophageal reflux disease without esophagitis: Secondary | ICD-10-CM | POA: Diagnosis not present

## 2022-03-03 DIAGNOSIS — Z712 Person consulting for explanation of examination or test findings: Secondary | ICD-10-CM | POA: Diagnosis not present

## 2022-03-03 DIAGNOSIS — I10 Essential (primary) hypertension: Secondary | ICD-10-CM | POA: Diagnosis not present

## 2022-03-03 DIAGNOSIS — R0600 Dyspnea, unspecified: Secondary | ICD-10-CM | POA: Diagnosis not present

## 2022-03-03 DIAGNOSIS — F419 Anxiety disorder, unspecified: Secondary | ICD-10-CM | POA: Diagnosis not present

## 2022-03-03 DIAGNOSIS — E559 Vitamin D deficiency, unspecified: Secondary | ICD-10-CM | POA: Diagnosis not present

## 2022-03-05 DIAGNOSIS — M545 Low back pain, unspecified: Secondary | ICD-10-CM | POA: Diagnosis not present

## 2022-03-05 DIAGNOSIS — R0600 Dyspnea, unspecified: Secondary | ICD-10-CM | POA: Diagnosis not present

## 2022-03-05 DIAGNOSIS — M5441 Lumbago with sciatica, right side: Secondary | ICD-10-CM | POA: Diagnosis not present

## 2022-03-05 DIAGNOSIS — M5442 Lumbago with sciatica, left side: Secondary | ICD-10-CM | POA: Diagnosis not present

## 2022-03-16 DIAGNOSIS — Z79899 Other long term (current) drug therapy: Secondary | ICD-10-CM | POA: Diagnosis not present

## 2022-03-18 DIAGNOSIS — G4700 Insomnia, unspecified: Secondary | ICD-10-CM | POA: Diagnosis not present

## 2022-03-18 DIAGNOSIS — Z79899 Other long term (current) drug therapy: Secondary | ICD-10-CM | POA: Diagnosis not present

## 2022-03-18 DIAGNOSIS — F411 Generalized anxiety disorder: Secondary | ICD-10-CM | POA: Diagnosis not present

## 2022-03-18 DIAGNOSIS — F330 Major depressive disorder, recurrent, mild: Secondary | ICD-10-CM | POA: Diagnosis not present

## 2022-03-18 DIAGNOSIS — Z6827 Body mass index (BMI) 27.0-27.9, adult: Secondary | ICD-10-CM | POA: Diagnosis not present

## 2022-04-01 DIAGNOSIS — Z79899 Other long term (current) drug therapy: Secondary | ICD-10-CM | POA: Diagnosis not present

## 2022-04-05 DIAGNOSIS — Z79899 Other long term (current) drug therapy: Secondary | ICD-10-CM | POA: Diagnosis not present

## 2022-04-22 DIAGNOSIS — Z79899 Other long term (current) drug therapy: Secondary | ICD-10-CM | POA: Diagnosis not present

## 2022-04-29 DIAGNOSIS — Z79899 Other long term (current) drug therapy: Secondary | ICD-10-CM | POA: Diagnosis not present

## 2022-04-29 DIAGNOSIS — F411 Generalized anxiety disorder: Secondary | ICD-10-CM | POA: Diagnosis not present

## 2022-05-01 DIAGNOSIS — H5213 Myopia, bilateral: Secondary | ICD-10-CM | POA: Diagnosis not present

## 2022-05-06 DIAGNOSIS — Z3009 Encounter for other general counseling and advice on contraception: Secondary | ICD-10-CM | POA: Diagnosis not present

## 2022-05-06 DIAGNOSIS — Z3046 Encounter for surveillance of implantable subdermal contraceptive: Secondary | ICD-10-CM | POA: Diagnosis not present

## 2022-05-22 DIAGNOSIS — H5213 Myopia, bilateral: Secondary | ICD-10-CM | POA: Diagnosis not present

## 2022-05-23 DIAGNOSIS — Z0000 Encounter for general adult medical examination without abnormal findings: Secondary | ICD-10-CM | POA: Diagnosis not present

## 2022-05-23 DIAGNOSIS — I10 Essential (primary) hypertension: Secondary | ICD-10-CM | POA: Diagnosis not present

## 2022-05-23 DIAGNOSIS — M129 Arthropathy, unspecified: Secondary | ICD-10-CM | POA: Diagnosis not present

## 2022-05-23 DIAGNOSIS — R5383 Other fatigue: Secondary | ICD-10-CM | POA: Diagnosis not present

## 2022-05-23 DIAGNOSIS — Z79899 Other long term (current) drug therapy: Secondary | ICD-10-CM | POA: Diagnosis not present

## 2022-05-23 DIAGNOSIS — E559 Vitamin D deficiency, unspecified: Secondary | ICD-10-CM | POA: Diagnosis not present

## 2022-05-26 DIAGNOSIS — Z79899 Other long term (current) drug therapy: Secondary | ICD-10-CM | POA: Diagnosis not present

## 2022-05-27 DIAGNOSIS — Z6828 Body mass index (BMI) 28.0-28.9, adult: Secondary | ICD-10-CM | POA: Diagnosis not present

## 2022-05-27 DIAGNOSIS — E663 Overweight: Secondary | ICD-10-CM | POA: Diagnosis not present

## 2022-05-27 DIAGNOSIS — F330 Major depressive disorder, recurrent, mild: Secondary | ICD-10-CM | POA: Diagnosis not present

## 2022-05-27 DIAGNOSIS — F411 Generalized anxiety disorder: Secondary | ICD-10-CM | POA: Diagnosis not present

## 2022-06-22 DIAGNOSIS — Z79899 Other long term (current) drug therapy: Secondary | ICD-10-CM | POA: Diagnosis not present

## 2022-06-24 DIAGNOSIS — Z79899 Other long term (current) drug therapy: Secondary | ICD-10-CM | POA: Diagnosis not present

## 2022-07-23 DIAGNOSIS — Z79899 Other long term (current) drug therapy: Secondary | ICD-10-CM | POA: Diagnosis not present

## 2022-08-24 DIAGNOSIS — I10 Essential (primary) hypertension: Secondary | ICD-10-CM | POA: Diagnosis not present

## 2022-08-24 DIAGNOSIS — Z79899 Other long term (current) drug therapy: Secondary | ICD-10-CM | POA: Diagnosis not present

## 2022-08-24 DIAGNOSIS — E559 Vitamin D deficiency, unspecified: Secondary | ICD-10-CM | POA: Diagnosis not present

## 2022-08-26 DIAGNOSIS — F411 Generalized anxiety disorder: Secondary | ICD-10-CM | POA: Diagnosis not present

## 2022-08-26 DIAGNOSIS — F330 Major depressive disorder, recurrent, mild: Secondary | ICD-10-CM | POA: Diagnosis not present

## 2022-08-26 DIAGNOSIS — Z79899 Other long term (current) drug therapy: Secondary | ICD-10-CM | POA: Diagnosis not present

## 2022-08-26 DIAGNOSIS — Z6827 Body mass index (BMI) 27.0-27.9, adult: Secondary | ICD-10-CM | POA: Diagnosis not present

## 2022-08-26 DIAGNOSIS — E663 Overweight: Secondary | ICD-10-CM | POA: Diagnosis not present

## 2022-08-26 DIAGNOSIS — Z6828 Body mass index (BMI) 28.0-28.9, adult: Secondary | ICD-10-CM | POA: Diagnosis not present

## 2023-05-01 DIAGNOSIS — Z3491 Encounter for supervision of normal pregnancy, unspecified, first trimester: Secondary | ICD-10-CM | POA: Diagnosis not present

## 2023-05-01 DIAGNOSIS — M545 Low back pain, unspecified: Secondary | ICD-10-CM | POA: Diagnosis not present

## 2023-05-12 DIAGNOSIS — R0981 Nasal congestion: Secondary | ICD-10-CM | POA: Diagnosis not present

## 2023-05-12 DIAGNOSIS — R5383 Other fatigue: Secondary | ICD-10-CM | POA: Diagnosis not present

## 2023-05-12 DIAGNOSIS — U071 COVID-19: Secondary | ICD-10-CM | POA: Diagnosis not present

## 2023-05-12 DIAGNOSIS — R519 Headache, unspecified: Secondary | ICD-10-CM | POA: Diagnosis not present

## 2023-05-12 DIAGNOSIS — R0602 Shortness of breath: Secondary | ICD-10-CM | POA: Diagnosis not present

## 2023-05-19 ENCOUNTER — Other Ambulatory Visit (INDEPENDENT_AMBULATORY_CARE_PROVIDER_SITE_OTHER): Payer: Medicaid Other

## 2023-05-19 ENCOUNTER — Ambulatory Visit (INDEPENDENT_AMBULATORY_CARE_PROVIDER_SITE_OTHER): Payer: Medicaid Other | Admitting: *Deleted

## 2023-05-19 ENCOUNTER — Encounter: Payer: Self-pay | Admitting: Obstetrics & Gynecology

## 2023-05-19 VITALS — BP 114/77 | HR 88 | Wt 174.0 lb

## 2023-05-19 DIAGNOSIS — Z3481 Encounter for supervision of other normal pregnancy, first trimester: Secondary | ICD-10-CM

## 2023-05-19 DIAGNOSIS — O0990 Supervision of high risk pregnancy, unspecified, unspecified trimester: Secondary | ICD-10-CM | POA: Insufficient documentation

## 2023-05-19 DIAGNOSIS — Z3A12 12 weeks gestation of pregnancy: Secondary | ICD-10-CM

## 2023-05-19 DIAGNOSIS — O3680X0 Pregnancy with inconclusive fetal viability, not applicable or unspecified: Secondary | ICD-10-CM

## 2023-05-19 DIAGNOSIS — O34219 Maternal care for unspecified type scar from previous cesarean delivery: Secondary | ICD-10-CM | POA: Insufficient documentation

## 2023-05-19 DIAGNOSIS — O99320 Drug use complicating pregnancy, unspecified trimester: Secondary | ICD-10-CM | POA: Insufficient documentation

## 2023-05-19 DIAGNOSIS — F1120 Opioid dependence, uncomplicated: Secondary | ICD-10-CM | POA: Insufficient documentation

## 2023-05-19 DIAGNOSIS — F1320 Sedative, hypnotic or anxiolytic dependence, uncomplicated: Secondary | ICD-10-CM | POA: Insufficient documentation

## 2023-05-19 MED ORDER — HYDROXYZINE HCL 25 MG PO TABS: 25.0000 mg | ORAL_TABLET | Freq: Four times a day (QID) | ORAL | 2 refills | Status: AC | PRN

## 2023-05-19 MED ORDER — BUSPIRONE HCL 10 MG PO TABS: 10.0000 mg | ORAL_TABLET | Freq: Three times a day (TID) | ORAL | 1 refills | Status: DC

## 2023-05-19 NOTE — Progress Notes (Signed)
New OB Intake  I explained I am completing New OB Intake today. We discussed EDD of 11/26/2023, by Last Menstrual Period. Pt is [id number]. I reviewed her allergies, medications and Medical/Surgical/OB history.    Patient Active Problem List   Diagnosis Date Noted   Supervision of high risk pregnancy, antepartum 05/19/2023   Previous cesarean section complicating pregnancy 05/19/2023   Chronic midline thoracic back pain 06/12/2019   Chronic posttraumatic stress disorder 06/12/2019   Anxiety 02/06/2018   History of gestational diabetes 12/03/2017   Nontoxic uninodular goiter 11/13/2013   Depression    Multinodular goiter 01/04/2012    Concerns addressed today  Patient informed that the ultrasound is considered a limited obstetric ultrasound and is not intended to be a complete ultrasound exam.  Patient also informed that the ultrasound is not being completed with the intent of assessing for fetal or placental anomalies or any pelvic abnormalities. Explained that the purpose of today's ultrasound is to assess for viability.  Patient acknowledges the purpose of the exam and the limitations of the study.     Anatomy US Explained first scheduled [place] will be around 19 weeks.   Last Pap Diagnosis  Date Value Ref Range Status  08/15/2017   Final   NEGATIVE FOR INTRAEPITHELIAL LESIONS OR MALIGNANCY.    First visit review I reviewed new OB appt with patient. Explained pt will be seen by Dr Crissie Reese for her pain management.      Scheryl Marten, RN 05/19/2023  4:28 PM

## 2023-05-24 ENCOUNTER — Ambulatory Visit (INDEPENDENT_AMBULATORY_CARE_PROVIDER_SITE_OTHER): Payer: Medicaid Other | Admitting: Family Medicine

## 2023-05-24 ENCOUNTER — Encounter: Payer: Self-pay | Admitting: Family Medicine

## 2023-05-24 VITALS — BP 130/82 | HR 73 | Wt 168.4 lb

## 2023-05-24 DIAGNOSIS — E042 Nontoxic multinodular goiter: Secondary | ICD-10-CM | POA: Diagnosis not present

## 2023-05-24 DIAGNOSIS — O0991 Supervision of high risk pregnancy, unspecified, first trimester: Secondary | ICD-10-CM | POA: Diagnosis not present

## 2023-05-24 DIAGNOSIS — O34219 Maternal care for unspecified type scar from previous cesarean delivery: Secondary | ICD-10-CM | POA: Diagnosis not present

## 2023-05-24 DIAGNOSIS — O99321 Drug use complicating pregnancy, first trimester: Secondary | ICD-10-CM

## 2023-05-24 DIAGNOSIS — O219 Vomiting of pregnancy, unspecified: Secondary | ICD-10-CM

## 2023-05-24 DIAGNOSIS — F4312 Post-traumatic stress disorder, chronic: Secondary | ICD-10-CM

## 2023-05-24 DIAGNOSIS — Z3A13 13 weeks gestation of pregnancy: Secondary | ICD-10-CM

## 2023-05-24 DIAGNOSIS — O99320 Drug use complicating pregnancy, unspecified trimester: Secondary | ICD-10-CM

## 2023-05-24 DIAGNOSIS — F419 Anxiety disorder, unspecified: Secondary | ICD-10-CM

## 2023-05-24 DIAGNOSIS — O0990 Supervision of high risk pregnancy, unspecified, unspecified trimester: Secondary | ICD-10-CM

## 2023-05-24 DIAGNOSIS — M546 Pain in thoracic spine: Secondary | ICD-10-CM

## 2023-05-24 DIAGNOSIS — Z8632 Personal history of gestational diabetes: Secondary | ICD-10-CM

## 2023-05-24 DIAGNOSIS — K219 Gastro-esophageal reflux disease without esophagitis: Secondary | ICD-10-CM

## 2023-05-24 DIAGNOSIS — O99611 Diseases of the digestive system complicating pregnancy, first trimester: Secondary | ICD-10-CM

## 2023-05-24 DIAGNOSIS — F1920 Other psychoactive substance dependence, uncomplicated: Secondary | ICD-10-CM

## 2023-05-24 DIAGNOSIS — G8929 Other chronic pain: Secondary | ICD-10-CM

## 2023-05-24 MED ORDER — PANTOPRAZOLE SODIUM 40 MG PO TBEC: 40.0000 mg | DELAYED_RELEASE_TABLET | Freq: Every day | ORAL | 3 refills | Status: DC

## 2023-05-24 MED ORDER — GLYCOPYRROLATE 2 MG PO TABS: 2.0000 mg | ORAL_TABLET | Freq: Three times a day (TID) | ORAL | 3 refills | Status: DC | PRN

## 2023-05-24 MED ORDER — CYCLOBENZAPRINE HCL 10 MG PO TABS: 10.0000 mg | ORAL_TABLET | Freq: Three times a day (TID) | ORAL | 1 refills | Status: DC | PRN

## 2023-05-24 MED ORDER — ONDANSETRON 4 MG PO TBDP: 4.0000 mg | ORAL_TABLET | Freq: Four times a day (QID) | ORAL | 5 refills | Status: DC | PRN

## 2023-05-24 MED ORDER — PROMETHAZINE HCL 25 MG PO TABS: 25.0000 mg | ORAL_TABLET | Freq: Four times a day (QID) | ORAL | 1 refills | Status: DC | PRN

## 2023-05-24 NOTE — Progress Notes (Signed)
 Subjective:   [name] Moreno is a 43 y.o. [id number] at [redacted]w[redacted]d by LMP being seen today for her first obstetrical visit.  Her obstetrical history is significant for advanced maternal age and hx of GDM, PTSD, substance dependence in pregnancy . Pregnancy history fully reviewed.  Patient reports significant nausea and excessive salivation.  Referred from [facility] office due to concerns regarding substance dependence. Has a stable job. Two other kids that are 53 and 40 years old, lives with them. In a long term relationship with last child's/this child's father. Ran out of all her medications three days ago, both xanax and percocet's. Estimtes she has been on these for at least a year. Ever since she ran out of meds she has been having body aches, nausea, and feeling unwell. Reports she had been storing up pills and that's what she had been using over the past few months. Stopped seeing Dr. Bernardino Pinal (pain management) last summer. Reports she stopped seeing him due to not liking him as well as insurance issues. Was seeing him for sciatica/chronic back pain.   Reports childhood trauma that she has carried throughout her life. Has been on numerous mediations, reports only benzodiazapenes have worked for her over time.       HISTORY: OB History  Gravida Para Term Preterm AB Living  4 2 0 2 1 2   SAB IAB Ectopic Multiple Live Births  1 0 0 0 2    # Outcome Date GA Lbr Len/2nd Weight Sex Type Anes PTL Lv  4 Current           3 Preterm 01/24/18 [redacted]w[redacted]d  5 lb 6.8 oz (2.46 kg) F CS-LTranv EPI  LIV     Name: [facility]     Apgar1: 8  Apgar5: 9  2 SAB 04/19/17     SAB     1 Preterm 05/29/03 [redacted]w[redacted]d    Vag-Spont   LIV     Birth Comments: Had placental separation leading to preterm delivery     Complications: Abruptio Placenta     Last pap smear: Lab Results  Component Value Date   DIAGPAP  08/15/2017    NEGATIVE FOR INTRAEPITHELIAL LESIONS OR MALIGNANCY.   HPV NOT DETECTED  08/15/2017   *needs*  Past Medical History:  Diagnosis Date   Benign cyst of right breast 10/13/2017   bx by radiology showed lactating adenoma     Depression    Gastroesophageal reflux disease with esophagitis without hemorrhage 07/12/2019   Last Assessment & Plan:   Formatting of this note might be different from the original.  Suspect this was made worse due to prednisone taper.  Will start PPI at 40 mg twice daily for 2 weeks then 40 mg daily.  Consider referral to GI for no improvement or worsening signs and symptoms.     We did discuss dietary triggers and recommendations for controlling GERD     Headache    Heart murmur    Multinodular goiter    Thyroid  nodule    Past Surgical History:  Procedure Laterality Date   CESAREAN SECTION N/A 01/24/2018   Procedure: CESAREAN SECTION;  Surgeon: Nicholaus Burnard HERO, MD;  Location: [facility] BIRTHING SUITES;  Service: Obstetrics;  Laterality: N/A;   Family History  Problem Relation Age of Onset   Hypertension Other        Grandparents   Diabetes Other        Grandparents   Social History   Tobacco Use  Smoking status: Former    Current packs/day: 0.30    Average packs/day: 0.3 packs/day for 5.9 years (1.8 ttl pk-yrs)    Types: Cigarettes    Start date: 07/01/2017   Smokeless tobacco: Never  Substance Use Topics   Alcohol use: Not Currently    Comment: occas. when not preg   Drug use: No   No Active Allergies Current Outpatient Medications on File Prior to Visit  Medication Sig Dispense Refill   oxyCODONE -acetaminophen  (PERCOCET) 7.5-325 MG tablet Take 1 tablet by mouth every 4 (four) hours as needed for severe pain (pain score 7-10).     Prenatal Vit-Fe Fumarate-FA (MULTIVITAMIN-PRENATAL) 27-0.8 MG TABS tablet Take 1 tablet by mouth daily at 12 noon.     ALPRAZolam (XANAX) 0.25 MG tablet Take 0.25 mg by mouth at bedtime as needed for anxiety. (Patient not taking: Reported on 05/24/2023)     busPIRone  (BUSPAR ) 10 MG tablet Take 1 tablet  (10 mg total) by mouth 3 (three) times daily. (Patient not taking: Reported on 05/24/2023) 90 tablet 1   hydrOXYzine  (ATARAX ) 25 MG tablet Take 1 tablet (25 mg total) by mouth every 6 (six) hours as needed for itching. (Patient not taking: Reported on 05/24/2023) 30 tablet 2   No current facility-administered medications on file prior to visit.     Exam   Vitals:   05/24/23 1541  BP: 130/82  Pulse: 73  Weight: 168 lb 6.4 oz (76.4 kg)   Fetal Heart Rate (bpm): 150  System: General: well-developed, well-nourished female in no acute distress   Skin: normal coloration and turgor, no rashes   Neurologic: oriented, normal, negative, normal mood   Extremities: normal strength, tone, and muscle mass, ROM of all joints is normal   HEENT PERRLA, extraocular movement intact and sclera clear, anicteric   Neck supple and no masses   Respiratory:  no respiratory distress      Assessment:   Pregnancy: [id number] Patient Active Problem List   Diagnosis Date Noted   Supervision of high risk pregnancy, antepartum 05/19/2023   Previous cesarean section complicating pregnancy 05/19/2023   Chronic narcotic dependence (HCC) 05/19/2023   Benzodiazepine dependence (HCC) 05/19/2023   Substance dependence during pregnancy (HCC) 05/19/2023   Chronic midline thoracic back pain 06/12/2019   Chronic posttraumatic stress disorder 06/12/2019   Anxiety 02/06/2018   History of gestational diabetes 12/03/2017   Nontoxic uninodular goiter 11/13/2013   Depression    Multinodular goiter 01/04/2012     Plan:  1. Supervision of high risk pregnancy, antepartum (Primary) BP and FHR normal Majority of visit spent discussing opioid dependence and we did not address other prenatal issues, see below Plan to get new OB labs at next visit  2. Substance dependence during pregnancy (HCC) At present patient does not meet DSM-V criteria for opioid use disorder, though she clearly has physical dependence At this point she  has gone through a majority of withdrawal, and while it is not recommended to do so in pregnancy she is essentially most of the way through the process We discussed symptomatic treatment vs starting suboxone  for control of pain as well as treatment of withdrawal symptoms She elected for symptomatic treatment which I think is very reasonable as it will decrease risk of NAS Accepts UDS after verbal consent Will see her back shortly to see how she is doing, if she does require opioids I will plan to start suboxone   3. Previous cesarean section complicating pregnancy Did not address mode of delivery  at this visit Op note from 2019 reviewed, pLTCS for placental abruption, no contraindication to TOLAC  4. Multinodular goiter Plan to explore this further at next visit Check TSH and free T4 with new OB labs  5. History of gestational diabetes Check A1c with new OB labs  6. Chronic posttraumatic stress disorder Patient reports long standing history but not currently engaged in psychiatric care Accepts referral to Psychiatry  7. Chronic midline thoracic back pain Previously managed with long term opioids We discussed this is no longer considered appropriate long term therapy and I discussed referring her to a pain specialist which she is amenable with Message sent to Dr. Renita in Anesthesiology to see if he can see patient for consultation    Routine obstetric precautions reviewed. Return in about 2 weeks (around 06/07/2023) for REACH clinic.    Donnice CHRISTELLA Carolus, MD/MPH Attending Family Medicine Physician, [facility] for [facility], [facility]

## 2023-05-24 NOTE — Progress Notes (Signed)
   Subjective:   [name] Moreno is a 43 y.o. [phone] here today for pain management plan in pregnancy.  Mom reports hx of benzodiazepine and oxycodone  use with her last oxycodone  taken 2/1.  Mom reports persistent n/v, GER.  Health Maintenance Due  Topic Date Due   COVID-19 Vaccine (1) Never done   Cervical Cancer Screening (HPV/Pap Cotest)  08/16/2022   INFLUENZA VACCINE  11/18/2022    Past Medical History:  Diagnosis Date   Benign cyst of right breast 10/13/2017   bx by radiology showed lactating adenoma     Depression    Gastroesophageal reflux disease with esophagitis without hemorrhage 07/12/2019   Last Assessment & Plan:   Formatting of this note might be different from the original.  Suspect this was made worse due to prednisone taper.  Will start PPI at 40 mg twice daily for 2 weeks then 40 mg daily.  Consider referral to GI for no improvement or worsening signs and symptoms.     We did discuss dietary triggers and recommendations for controlling GERD     Headache    Heart murmur    Multinodular goiter    Thyroid  nodule     Past Surgical History:  Procedure Laterality Date   CESAREAN SECTION N/A 01/24/2018   Procedure: CESAREAN SECTION;  Surgeon: Nicholaus Burnard HERO, MD;  Location: [facility] BIRTHING SUITES;  Service: Obstetrics;  Laterality: N/A;    The following portions of the patient's history were reviewed and updated as appropriate: allergies, current medications, past family history, past medical history, past social history, past surgical history and problem list.     Objective:    Introduced mom to [facility] clinic and provided printed information regarding the substance exposed newborn.  Mom is currently uncertain of her OUD management plan so will follow regarding planned newborn observation and management. Mom has substance exposed consult contact information if needs arise before next visit.    Assessment and Plan:   Problem List Items Addressed This Visit        Endocrine   Multinodular goiter     Other   History of gestational diabetes   Chronic midline thoracic back pain   Chronic posttraumatic stress disorder   Supervision of high risk pregnancy, antepartum - Primary   Previous cesarean section complicating pregnancy   Substance dependence during pregnancy (HCC)   Other Visit Diagnoses       Gastroesophageal reflux in pregnancy       Relevant Medications   ondansetron  (ZOFRAN -ODT) 4 MG disintegrating tablet   pantoprazole  (PROTONIX ) 40 MG tablet   glycopyrrolate  (ROBINUL ) 2 MG tablet     Nausea and vomiting during pregnancy       Relevant Medications   ondansetron  (ZOFRAN -ODT) 4 MG disintegrating tablet   promethazine  (PHENERGAN ) 25 MG tablet   glycopyrrolate  (ROBINUL ) 2 MG tablet       Routine preventative health maintenance measures emphasized. Please refer to After Visit Summary for other counseling recommendations.   Return in about 2 weeks (around 06/07/2023) for REACH clinic.    Total face-to-face time with patient: 15 minutes.  Over 50% of encounter was spent on counseling and coordination of care.   Delon LITTIE Frater, NNP-BC Neonatal Nurse Practitioner Substance Exposed Newborn Consult at the [facility] [phone]

## 2023-05-27 LAB — OPIATE CLASS, MS, UR RFX
Codeine: NOT DETECTED ng/mg{creat}
Dihydrocodeine: NOT DETECTED ng/mg{creat}
Hydrocodone: NOT DETECTED ng/mg{creat}
Hydromorphone: NOT DETECTED ng/mg{creat}
Morphine: NOT DETECTED ng/mg{creat}
Norcodeine: NOT DETECTED ng/mg{creat}
Norhydrocodone: NOT DETECTED ng/mg{creat}
Normorphine: NOT DETECTED ng/mg{creat}
Opiate Class Confirmation: NEGATIVE

## 2023-05-27 LAB — TOXASSURE FLEX 15, UR
6-ACETYLMORPHINE IA: NEGATIVE ng/mL
7-aminoclonazepam: NOT DETECTED ng/mg{creat}
AMPHETAMINES IA: NEGATIVE ng/mL
Alpha-hydroxyalprazolam: NOT DETECTED ng/mg{creat}
Alpha-hydroxymidazolam: NOT DETECTED ng/mg{creat}
Alpha-hydroxytriazolam: NOT DETECTED ng/mg{creat}
Alprazolam: NOT DETECTED ng/mg{creat}
BARBITURATES IA: NEGATIVE ng/mL
BUPRENORPHINE: NEGATIVE
Benzodiazepines: NEGATIVE
Buprenorphine: NOT DETECTED ng/mg{creat}
CANNABINOIDS IA: NEGATIVE ng/mL
COCAINE METABOLITE IA: NEGATIVE ng/mL
Clonazepam: NOT DETECTED ng/mg{creat}
Creatinine: 308 mg/dL
Desalkylflurazepam: NOT DETECTED ng/mg{creat}
Desmethyldiazepam: NOT DETECTED ng/mg{creat}
Desmethylflunitrazepam: NOT DETECTED ng/mg{creat}
Diazepam: NOT DETECTED ng/mg{creat}
ETHYL ALCOHOL Enzymatic: NEGATIVE g/dL
FENTANYL: NEGATIVE
Fentanyl: NOT DETECTED ng/mg{creat}
Flunitrazepam: NOT DETECTED ng/mg{creat}
Lorazepam: NOT DETECTED ng/mg{creat}
METHADONE IA: NEGATIVE ng/mL
METHADONE MTB IA: NEGATIVE ng/mL
Midazolam: NOT DETECTED ng/mg{creat}
Norbuprenorphine: NOT DETECTED ng/mg{creat}
Norfentanyl: NOT DETECTED ng/mg{creat}
Oxazepam: NOT DETECTED ng/mg{creat}
PHENCYCLIDINE IA: NEGATIVE ng/mL
TAPENTADOL, IA: NEGATIVE ng/mL
TRAMADOL IA: NEGATIVE ng/mL
Temazepam: NOT DETECTED ng/mg{creat}

## 2023-05-27 LAB — OXYCODONE CLASS, MS, UR RFX
Noroxycodone: >3247 ng/mg{creat}
Noroxymorphone: 1702 ng/mg{creat}
Oxycodone Class Confirmation: POSITIVE
Oxycodone: >3247 ng/mg{creat}
Oxymorphone: >3247 ng/mg{creat}

## 2023-05-27 LAB — PROTEIN / CREATININE RATIO, URINE
Creatinine, Urine: 324.6 mg/dL
Protein, Ur: 24.9 mg/dL
Protein/Creat Ratio: 77 mg/g{creat} (ref 0–200)

## 2023-05-31 ENCOUNTER — Encounter: Payer: Medicaid Other | Admitting: Obstetrics & Gynecology

## 2023-06-07 ENCOUNTER — Other Ambulatory Visit (HOSPITAL_COMMUNITY): Payer: Self-pay

## 2023-06-07 ENCOUNTER — Ambulatory Visit: Payer: Medicaid Other | Admitting: Family Medicine

## 2023-06-07 ENCOUNTER — Encounter: Payer: Self-pay | Admitting: Family Medicine

## 2023-06-07 ENCOUNTER — Other Ambulatory Visit (HOSPITAL_COMMUNITY)
Admission: RE | Admit: 2023-06-07 | Discharge: 2023-06-07 | Disposition: A | Discharge status: Home / Self Care | Payer: Medicaid Other | Source: Ambulatory Visit | Attending: Family Medicine | Admitting: Family Medicine

## 2023-06-07 ENCOUNTER — Other Ambulatory Visit (HOSPITAL_BASED_OUTPATIENT_CLINIC_OR_DEPARTMENT_OTHER): Payer: Self-pay

## 2023-06-07 VITALS — BP 127/79 | HR 80 | Wt 180.8 lb

## 2023-06-07 DIAGNOSIS — O34219 Maternal care for unspecified type scar from previous cesarean delivery: Secondary | ICD-10-CM | POA: Diagnosis not present

## 2023-06-07 DIAGNOSIS — O0992 Supervision of high risk pregnancy, unspecified, second trimester: Secondary | ICD-10-CM

## 2023-06-07 DIAGNOSIS — F1920 Other psychoactive substance dependence, uncomplicated: Secondary | ICD-10-CM

## 2023-06-07 DIAGNOSIS — O0990 Supervision of high risk pregnancy, unspecified, unspecified trimester: Secondary | ICD-10-CM | POA: Diagnosis not present

## 2023-06-07 DIAGNOSIS — M546 Pain in thoracic spine: Secondary | ICD-10-CM

## 2023-06-07 DIAGNOSIS — Z3A15 15 weeks gestation of pregnancy: Secondary | ICD-10-CM

## 2023-06-07 DIAGNOSIS — O99012 Anemia complicating pregnancy, second trimester: Secondary | ICD-10-CM

## 2023-06-07 DIAGNOSIS — E042 Nontoxic multinodular goiter: Secondary | ICD-10-CM

## 2023-06-07 DIAGNOSIS — O99322 Drug use complicating pregnancy, second trimester: Secondary | ICD-10-CM | POA: Diagnosis not present

## 2023-06-07 DIAGNOSIS — G8929 Other chronic pain: Secondary | ICD-10-CM

## 2023-06-07 DIAGNOSIS — R772 Abnormality of alphafetoprotein: Secondary | ICD-10-CM

## 2023-06-07 DIAGNOSIS — F4312 Post-traumatic stress disorder, chronic: Secondary | ICD-10-CM

## 2023-06-07 DIAGNOSIS — Z8632 Personal history of gestational diabetes: Secondary | ICD-10-CM

## 2023-06-07 DIAGNOSIS — O99320 Drug use complicating pregnancy, unspecified trimester: Secondary | ICD-10-CM

## 2023-06-07 MED ORDER — BUPRENORPHINE HCL-NALOXONE HCL 2-0.5 MG SL FILM
1.0000 | ORAL_FILM | Freq: Three times a day (TID) | SUBLINGUAL | 0 refills | Status: AC | PRN
  Filled 2023-06-07 (×2): qty 90, 30d supply, fill #0

## 2023-06-07 NOTE — Progress Notes (Signed)
   Subjective:   [name] Moreno is a 43 y.o. [phone] here today for ongoing prenatal care and substance use management.  She reports new onset back pain and scant amount of bleeding noted following a void.  No fever. Encouraged her to relay these symptoms for Dr. Crissie Reese for further evaluation.  Health Maintenance Due  Topic Date Due   COVID-19 Vaccine (1) Never done   Cervical Cancer Screening (HPV/Pap Cotest)  08/16/2022   INFLUENZA VACCINE  11/18/2022    Past Medical History:  Diagnosis Date   Benign cyst of right breast 10/13/2017   bx by radiology showed lactating adenoma     Depression    Gastroesophageal reflux disease with esophagitis without hemorrhage 07/12/2019   Last Assessment & Plan:   Formatting of this note might be different from the original.  Suspect this was made worse due to prednisone taper.  Will start PPI at 40 mg twice daily for 2 weeks then 40 mg daily.  Consider referral to GI for no improvement or worsening signs and symptoms.     We did discuss dietary triggers and recommendations for controlling GERD     Headache    Heart murmur    Multinodular goiter    Thyroid nodule     Past Surgical History:  Procedure Laterality Date   CESAREAN SECTION N/A 01/24/2018   Procedure: CESAREAN SECTION;  Surgeon: Conan Bowens, MD;  Location: [facility] BIRTHING SUITES;  Service: Obstetrics;  Laterality: N/A;    The following portions of the patient's history were reviewed and updated as appropriate: allergies, current medications, past family history, past medical history, past social history, past surgical history and problem list.     Objective:    "[name] Moreno" is stable in no apparent distress.  We reviewed Eat, Sleep Console management and extended newborn observation period at the time of delivery for her substance exposed infant.  She intends to formula feed this infant and will have WIC.  She plans to see [facility] for primary care for this infant.   She has contact information for the Substance Exposed Newborn team as needs arise between appointment.     Assessment and Plan:   Problem List Items Addressed This Visit       Endocrine   Multinodular goiter     Other   History of gestational diabetes   Chronic midline thoracic back pain   Chronic posttraumatic stress disorder   Supervision of high risk pregnancy, antepartum - Primary   Previous cesarean section complicating pregnancy   Substance dependence during pregnancy (HCC)    Routine preventative health maintenance measures emphasized. Please refer to After Visit Summary for other counseling recommendations.   Return in about 4 weeks (around 07/05/2023) for ob visit.    Total face-to-face time with patient: 20 minutes.  Over 50% of encounter was spent on counseling and coordination of care.   Hubert Azure, NNP-BC Neonatal Nurse Practitioner Substance Exposed Newborn Consult at the [facility] [phone]

## 2023-06-07 NOTE — Patient Instructions (Signed)

## 2023-06-07 NOTE — Progress Notes (Signed)
 Subjective:  [name] Moreno is a 43 y.o. [phone] at [redacted]w[redacted]d being seen today for ongoing prenatal care.  She is currently monitored for the following issues for this high-risk pregnancy and has Multinodular goiter; Depression; History of gestational diabetes; Nontoxic uninodular goiter; Anxiety; Chronic midline thoracic back pain; Chronic posttraumatic stress disorder; Supervision of high risk pregnancy, antepartum; Previous cesarean section complicating pregnancy; Chronic narcotic dependence (HCC); Benzodiazepine dependence (HCC); and Substance dependence during pregnancy [facility]) on their problem list.  Patient reports  vaginal bleeding .   . Vag. Bleeding: Bloody Show.   . Denies leaking of fluid.   The following portions of the patient's history were reviewed and updated as appropriate: allergies, current medications, past family history, past medical history, past social history, past surgical history and problem list. Problem list updated.  Objective:   Vitals:   06/07/23 1459  BP: 127/79  Pulse: 80  Weight: 180 lb 12.8 oz (82 kg)    Fetal Status: Fetal Heart Rate (bpm): 160         General:  Alert, oriented and cooperative. Patient is in no acute distress.  Skin: Skin is warm and dry. No rash noted.   Cardiovascular: Normal heart rate noted  Respiratory: Normal respiratory effort, no problems with respiration noted  Abdomen: Soft, gravid, appropriate for gestational age. Pain/Pressure: Present     Pelvic: Vag. Bleeding: Bloody Show     No blood in vaginal vault, unremarkble cervix, closed and long         Extremities: Normal range of motion.  Edema: None  Mental Status: Normal mood and affect. Normal behavior. Normal judgment and thought content.   Urinalysis:      PDMP reviewed during this encounter.    Last UDS: Lab Results  Component Value Date   PD2 FINAL 05/24/2023   CREATIUR 308 05/24/2023      Assessment and Plan:  Pregnancy: [id number] at [redacted]w[redacted]d  1.  Supervision of high risk pregnancy, antepartum (Primary) BP and FHR normal Nausea way better than last visit, able to eat Spotting started yesterday. She is Rh positive, does not need rhogam. Last intercourse was over the weekend, now recalls that she started bleeding shortly after Spec exam showed no acute findings Due for prenatal labs, collected today Ordered for anatomy scan Expressed desire to continue prenatal care here at [facility]  2. Substance dependence during pregnancy (HCC) Reports no use of opioids since last visit UDS today  3. Previous cesarean section complicating pregnancy Desires RCS and BTL Will sign BTL form at 28 wks  4. Multinodular goiter TSH today  5. History of gestational diabetes A1c today  6. Chronic posttraumatic stress disorder Referred to psych at last visit, not yet scheduled Encouraged to make an appt Referral re-sent  7. Chronic midline thoracic back pain Today reports that her back pain is back now that she is off her meds and is 10x worse than it used to be Was unable to cook dinner for her family last night and had so much pain she had to scoot up and down the stairs  Reports she has trialed lidocaine patches, multiple creams, flexeril, corticosteroid injections, none of which have been as effective as opioids Discussed that while it is not ideal, we can trial suboxone for chronic pain management She thinks she may have trialed this in the past Discussed this will lead to NAS, she accepts this trade off Start with suboxone 2 mg TID PRN, can uptitrate PRN UDS today Still trying to  contact Dr. Huntley Estelle regarding evaluation for chronic pain, message resent  Preterm labor symptoms and general obstetric precautions including but not limited to vaginal bleeding, contractions, leaking of fluid and fetal movement were reviewed in detail with the patient. Please refer to After Visit Summary for other counseling recommendations.  Return in about 2 weeks  (around 06/21/2023) for ob visit, REACH clinic.   Venora Maples, MD

## 2023-06-07 NOTE — Addendum Note (Signed)
 Addended by: Marjo Bicker on: 06/07/2023 05:13 PM   Modules accepted: Orders

## 2023-06-08 ENCOUNTER — Other Ambulatory Visit (HOSPITAL_BASED_OUTPATIENT_CLINIC_OR_DEPARTMENT_OTHER): Payer: Self-pay

## 2023-06-09 ENCOUNTER — Encounter: Payer: Self-pay | Admitting: Family Medicine

## 2023-06-09 ENCOUNTER — Other Ambulatory Visit (HOSPITAL_BASED_OUTPATIENT_CLINIC_OR_DEPARTMENT_OTHER): Payer: Self-pay

## 2023-06-09 DIAGNOSIS — R772 Abnormality of alphafetoprotein: Secondary | ICD-10-CM | POA: Insufficient documentation

## 2023-06-09 DIAGNOSIS — O99012 Anemia complicating pregnancy, second trimester: Secondary | ICD-10-CM

## 2023-06-09 MED ORDER — FERROUS SULFATE 325 (65 FE) MG PO TBEC: 325.0000 mg | DELAYED_RELEASE_TABLET | ORAL | 2 refills | Status: DC

## 2023-06-09 MED ORDER — FERROUS SULFATE 325 (65 FE) MG PO TBEC
325.0000 mg | DELAYED_RELEASE_TABLET | ORAL | 2 refills | Status: DC
  Filled 2023-06-09: qty 45, 90d supply, fill #0
  Filled 2023-08-09: qty 30, 60d supply, fill #0

## 2023-06-09 NOTE — Progress Notes (Signed)
 New OB labs show mild anemia (sending PO iron), and elevated AFP  Does not yet have MFM scan scheduled, clinical pool please call to schedule one ASAP to evaluate and have MFM counsel.  Clinical pool please also contact [facility], the order was put in that she is "Insulin Dep Diabetic: Yes" which is incorrect. I doubt this will change the interpretation but we should at least ask.   Attempted to contact patient by phone but no answer, left VM letting her know I would send a MyChart message about them.

## 2023-06-09 NOTE — Addendum Note (Signed)
 Addended by: Merian Capron on: 06/09/2023 10:22 AM   Modules accepted: Orders

## 2023-06-09 NOTE — Telephone Encounter (Signed)
 Called and spoke with patient, confirmed identity.  Notified her of mild anemia, PO iron rx sent to [facility] but she would prefer to have it at [facility], I will switch the prescription.   Discussed elevated AFP, reviewed that most common causes are spina bifida, stomach wall abnormalities, down syndrome, and idiopathic, but that it is not diagnostic of anything at present and we need to follow up her NIPT and anatomy scan results.   Patient asking about possibility of sickle cell for the baby, she reports she is sure she does not have sickle trait but knows that her partner does. We will follow up her horizon result but I reassured her that if she does not have sickle trait then the baby can not have sickle cell disease.   Finally she reports she has not yet been able to obtain her suboxone because the pharmacy does not stock 2 mg films. I will [facility] [place] to see if they have 4 mg films that she can cut in half so she can get it filled more quickly.   I called the pharmacy and they report that they received the medicine yesterday and it is ready for pickup. I let the patient know.

## 2023-06-10 ENCOUNTER — Other Ambulatory Visit (HOSPITAL_BASED_OUTPATIENT_CLINIC_OR_DEPARTMENT_OTHER): Payer: Self-pay

## 2023-06-10 LAB — CYTOLOGY - PAP
Chlamydia: NEGATIVE
Comment: NEGATIVE
Comment: NEGATIVE
Comment: NEGATIVE
Comment: NEGATIVE
Comment: NORMAL
HPV 16: POSITIVE — AB
HPV 18 / 45: NEGATIVE
High risk HPV: POSITIVE — AB
Neisseria Gonorrhea: NEGATIVE

## 2023-06-13 ENCOUNTER — Telehealth: Payer: Self-pay

## 2023-06-13 ENCOUNTER — Encounter: Payer: Self-pay | Admitting: Family Medicine

## 2023-06-13 DIAGNOSIS — R87810 Cervical high risk human papillomavirus (HPV) DNA test positive: Secondary | ICD-10-CM | POA: Insufficient documentation

## 2023-06-13 NOTE — Progress Notes (Signed)
 Abnormal pap, needs colpo per [facility] Admin pool please call patient and schedule colposcopy for next available, can be done at next OB visit if nothing available sooner

## 2023-06-13 NOTE — Telephone Encounter (Signed)
-----   Message from Venora Maples sent at 06/09/2023 10:22 AM EST ----- New OB labs show mild anemia (sending PO iron), and elevated AFP  Does not yet have MFM scan scheduled, clinical pool please call to schedule one ASAP to evaluate and have MFM counsel.  Clinical pool please also contact [facility], the order was put in that she is "Insulin Dep Diabetic: Yes" which is incorrect. I doubt this will change the interpretation but we should at least ask.   Attempted to contact patient by phone but no answer, left VM letting her know I would send a MyChart message about them.

## 2023-06-14 LAB — PANORAMA PRENATAL TEST FULL PANEL:PANORAMA TEST PLUS 5 ADDITIONAL MICRODELETIONS: FETAL FRACTION: 4.7

## 2023-06-15 ENCOUNTER — Encounter: Payer: Self-pay | Admitting: Family Medicine

## 2023-06-17 ENCOUNTER — Encounter: Payer: Self-pay | Admitting: Family Medicine

## 2023-06-17 LAB — HCV INTERPRETATION

## 2023-06-17 LAB — CBC/D/PLT+RPR+RH+ABO+RUBIGG...
Antibody Screen: NEGATIVE
Basophils Absolute: 0 10*3/uL (ref 0.0–0.2)
Basos: 0 %
EOS (ABSOLUTE): 0 10*3/uL (ref 0.0–0.4)
Eos: 0 %
HCV Ab: NONREACTIVE
HIV Screen 4th Generation wRfx: NONREACTIVE
Hematocrit: 34.3 % (ref 34.0–46.6)
Hemoglobin: 10.6 g/dL — ABNORMAL LOW (ref 11.1–15.9)
Hepatitis B Surface Ag: NEGATIVE
Immature Grans (Abs): 0.1 10*3/uL (ref 0.0–0.1)
Immature Granulocytes: 1 %
Lymphocytes Absolute: 1.9 10*3/uL (ref 0.7–3.1)
Lymphs: 27 %
MCH: 23.6 pg — ABNORMAL LOW (ref 26.6–33.0)
MCHC: 30.9 g/dL — ABNORMAL LOW (ref 31.5–35.7)
MCV: 76 fL — ABNORMAL LOW (ref 79–97)
Monocytes Absolute: 0.6 10*3/uL (ref 0.1–0.9)
Monocytes: 8 %
Neutrophils Absolute: 4.6 10*3/uL (ref 1.4–7.0)
Neutrophils: 64 %
Platelets: 220 10*3/uL (ref 150–450)
RBC: 4.5 x10E6/uL (ref 3.77–5.28)
RDW: 16.2 % — ABNORMAL HIGH (ref 11.7–15.4)
RPR Ser Ql: NONREACTIVE
Rh Factor: POSITIVE
Rubella Antibodies, IGG: 15.6 {index} (ref >0.99)
WBC: 7.2 10*3/uL (ref 3.4–10.8)

## 2023-06-17 LAB — HORIZON CUSTOM: REPORT SUMMARY: POSITIVE — AB

## 2023-06-17 LAB — TSH RFX ON ABNORMAL TO FREE T4: TSH: 2.08 u[IU]/mL (ref 0.450–4.500)

## 2023-06-17 LAB — AFP, SERUM, OPEN SPINA BIFIDA
AFP MoM: 1.93
AFP Value: 57 ng/mL
Gest. Age on Collection Date: 15.3 wk
Maternal Age At EDD: 40.6 a
OSBR Risk 1 IN: 1886
Test Results:: NEGATIVE
Weight: 180 [lb_av]

## 2023-06-17 LAB — HEMOGLOBIN A1C
Est. average glucose Bld gHb Est-mCnc: 105 mg/dL
Hgb A1c MFr Bld: 5.3 % (ref 4.8–5.6)

## 2023-06-17 NOTE — Telephone Encounter (Signed)
 [place] appts scheduled with MFM. VM left for patient stating new appt has been scheduled on March 25 and we will review further at appt on 3/4. [facility] notified of need to change AFP to state not insulin dependant.

## 2023-06-20 ENCOUNTER — Encounter: Payer: Self-pay | Admitting: Family Medicine

## 2023-06-20 ENCOUNTER — Other Ambulatory Visit (HOSPITAL_BASED_OUTPATIENT_CLINIC_OR_DEPARTMENT_OTHER): Payer: Self-pay

## 2023-06-20 DIAGNOSIS — D563 Thalassemia minor: Secondary | ICD-10-CM | POA: Insufficient documentation

## 2023-06-21 ENCOUNTER — Ambulatory Visit: Payer: Medicaid Other | Admitting: Family Medicine

## 2023-06-21 ENCOUNTER — Other Ambulatory Visit (HOSPITAL_BASED_OUTPATIENT_CLINIC_OR_DEPARTMENT_OTHER): Payer: Self-pay

## 2023-06-21 ENCOUNTER — Encounter: Payer: Self-pay | Admitting: Family Medicine

## 2023-06-21 VITALS — BP 104/69 | HR 90 | Wt 185.8 lb

## 2023-06-21 DIAGNOSIS — O99012 Anemia complicating pregnancy, second trimester: Secondary | ICD-10-CM

## 2023-06-21 DIAGNOSIS — E042 Nontoxic multinodular goiter: Secondary | ICD-10-CM

## 2023-06-21 DIAGNOSIS — O219 Vomiting of pregnancy, unspecified: Secondary | ICD-10-CM | POA: Diagnosis not present

## 2023-06-21 DIAGNOSIS — Z23 Encounter for immunization: Secondary | ICD-10-CM

## 2023-06-21 DIAGNOSIS — O99322 Drug use complicating pregnancy, second trimester: Secondary | ICD-10-CM

## 2023-06-21 DIAGNOSIS — F1920 Other psychoactive substance dependence, uncomplicated: Secondary | ICD-10-CM

## 2023-06-21 DIAGNOSIS — O34219 Maternal care for unspecified type scar from previous cesarean delivery: Secondary | ICD-10-CM

## 2023-06-21 DIAGNOSIS — Z3A17 17 weeks gestation of pregnancy: Secondary | ICD-10-CM

## 2023-06-21 DIAGNOSIS — G8929 Other chronic pain: Secondary | ICD-10-CM | POA: Diagnosis not present

## 2023-06-21 DIAGNOSIS — M546 Pain in thoracic spine: Secondary | ICD-10-CM | POA: Diagnosis not present

## 2023-06-21 DIAGNOSIS — R87810 Cervical high risk human papillomavirus (HPV) DNA test positive: Secondary | ICD-10-CM

## 2023-06-21 DIAGNOSIS — D563 Thalassemia minor: Secondary | ICD-10-CM

## 2023-06-21 DIAGNOSIS — O99320 Drug use complicating pregnancy, unspecified trimester: Secondary | ICD-10-CM

## 2023-06-21 DIAGNOSIS — F4312 Post-traumatic stress disorder, chronic: Secondary | ICD-10-CM | POA: Diagnosis not present

## 2023-06-21 DIAGNOSIS — Z8632 Personal history of gestational diabetes: Secondary | ICD-10-CM

## 2023-06-21 DIAGNOSIS — O0990 Supervision of high risk pregnancy, unspecified, unspecified trimester: Secondary | ICD-10-CM

## 2023-06-21 MED ORDER — CYCLOBENZAPRINE HCL 10 MG PO TABS
10.0000 mg | ORAL_TABLET | Freq: Three times a day (TID) | ORAL | 1 refills | Status: DC | PRN
  Filled 2023-06-21 – 2023-08-09 (×2): qty 30, 10d supply, fill #0

## 2023-06-21 MED ORDER — PROMETHAZINE HCL 25 MG PO TABS
25.0000 mg | ORAL_TABLET | Freq: Four times a day (QID) | ORAL | 1 refills | Status: DC | PRN
  Filled 2023-06-21: qty 30, 8d supply, fill #0

## 2023-06-21 MED ORDER — ONDANSETRON 4 MG PO TBDP
4.0000 mg | ORAL_TABLET | Freq: Four times a day (QID) | ORAL | 5 refills | Status: DC | PRN
  Filled 2023-06-21 – 2023-08-09 (×2): qty 20, 5d supply, fill #0

## 2023-06-21 NOTE — Progress Notes (Unsigned)
 Subjective:  [name] Moreno is a 43 y.o. [phone] at [redacted]w[redacted]d being seen today for ongoing prenatal care.  She is currently monitored for the following issues for this high-risk pregnancy and has Multinodular goiter; Depression; History of gestational diabetes; Anemia affecting pregnancy in second trimester; Nontoxic uninodular goiter; Anxiety; Chronic midline thoracic back pain; Chronic posttraumatic stress disorder; Supervision of high risk pregnancy, antepartum; Previous cesarean section complicating pregnancy; Chronic narcotic dependence (HCC); Benzodiazepine dependence (HCC); Substance dependence during pregnancy (HCC); Cervical high risk HPV (human papillomavirus) test positive; and Alpha thalassemia silent carrier on their problem list.  Patient reports no complaints.  Contractions: Not present. Vag. Bleeding: None.  Movement: Present. Denies leaking of fluid.   The following portions of the patient's history were reviewed and updated as appropriate: allergies, current medications, past family history, past medical history, past social history, past surgical history and problem list. Problem list updated.  Objective:   Vitals:   06/21/23 1508  BP: 104/69  Pulse: 90  Weight: 185 lb 12.8 oz (84.3 kg)    Fetal Status: Fetal Heart Rate (bpm): 160   Movement: Present     General:  Alert, oriented and cooperative. Patient is in no acute distress.  Skin: Skin is warm and dry. No rash noted.   Cardiovascular: Normal heart rate noted  Respiratory: Normal respiratory effort, no problems with respiration noted  Abdomen: Soft, gravid, appropriate for gestational age. Pain/Pressure: Absent     Pelvic: Vag. Bleeding: None     Cervical exam deferred        Extremities: Normal range of motion.  Edema: Trace  Mental Status: Normal mood and affect. Normal behavior. Normal judgment and thought content.   Urinalysis:      PDMP not reviewed this encounter.    Last UDS: Lab Results  Component  Value Date   PD2 FINAL 05/24/2023   CREATIUR 308 05/24/2023     Assessment and Plan:  Pregnancy: [id number] at [redacted]w[redacted]d  1. Substance dependence during pregnancy (HCC) reports no other substances besides suboxone since our last visit  2. Nausea and vomiting during pregnancy Refill sent   3. Supervision of high risk pregnancy, antepartum (Primary) BP and FHR normal Discussed AFP was normal when re-calculated, reassured patient Anatomy scan scheduled for 07/12/2023  4. Multinodular goiter TSH normal last visit  5. History of gestational diabetes Normal A1c, recheck 2hr GTT in third trimester  6. Chronic posttraumatic stress disorder Not addressed at this visit, previously declined psych referral  7. Chronic midline thoracic back pain At last visit rx for 2 mg TID PRN of suboxone for chronic pain Estimates taking it only about 4/7 days of the week, usually only on hard days, reports minimal benefit Discussed it is a low dose, OK to try 4 mg single dose and see if this is more effective UDS today I had also referred her for pain management with Dr. Huntley Estelle, she plans to call and make an appt  8. Previous cesarean section complicating pregnancy Desires RCS+BTL Sign BTL form at 28 wks  9. Alpha thalassemia silent carrier   10. Anemia affecting pregnancy in second trimester Lab Results  Component Value Date   HGB 10.6 (L) 06/07/2023   Mild, starting PO iron  11. Cervical high risk HPV (human papillomavirus) test positive Needs colpo but insufficient time today, will try to do at next visit  Preterm labor symptoms and general obstetric precautions including but not limited to vaginal bleeding, contractions, leaking of fluid and fetal movement were  reviewed in detail with the patient. Please refer to After Visit Summary for other counseling recommendations.  Return in about 4 weeks (around 07/19/2023) for REACH clinic, ob visit.   Venora Maples, MD

## 2023-06-21 NOTE — Progress Notes (Unsigned)
   Subjective:   [name] Moreno is a 43 y.o. [phone] here today for ongoing OB and SUD management, substance exposed newborn ongoing preparation.  "Duwayne Heck" reports she is feeling well and excited to find out that she is having a boy.  Health Maintenance Due  Topic Date Due   COVID-19 Vaccine (1) Never done   INFLUENZA VACCINE  11/18/2022    Past Medical History:  Diagnosis Date   Benign cyst of right breast 10/13/2017   bx by radiology showed lactating adenoma     Depression    Gastroesophageal reflux disease with esophagitis without hemorrhage 07/12/2019   Last Assessment & Plan:   Formatting of this note might be different from the original.  Suspect this was made worse due to prednisone taper.  Will start PPI at 40 mg twice daily for 2 weeks then 40 mg daily.  Consider referral to GI for no improvement or worsening signs and symptoms.     We did discuss dietary triggers and recommendations for controlling GERD     Headache    Heart murmur    Multinodular goiter    Thyroid nodule     Past Surgical History:  Procedure Laterality Date   CESAREAN SECTION N/A 01/24/2018   Procedure: CESAREAN SECTION;  Surgeon: Conan Bowens, MD;  Location: [facility];  Service: Obstetrics;  Laterality: N/A;    The following portions of the patient's history were reviewed and updated as appropriate: allergies, current medications, past family history, past medical history, past social history, past surgical history and problem list.     Objective:     Well appearing and interactive with conversation.   Assessment and Plan:  Briefly reviewed postpartum newborn care and observation, Eat, Sleep, Console management.  No new questions at this time. Problem List Items Addressed This Visit       Endocrine   Multinodular goiter     Other   History of gestational diabetes   Anemia affecting pregnancy in second trimester   Chronic midline thoracic back pain   Relevant Medications    cyclobenzaprine (FLEXERIL) 10 MG tablet   Other Relevant Orders   ToxAssure Flex 15, Ur   Chronic posttraumatic stress disorder   Supervision of high risk pregnancy, antepartum - Primary   Relevant Orders   Flu vaccine trivalent PF, 6mos and older(Flulaval,Afluria,Fluarix,Fluzone)   Previous cesarean section complicating pregnancy   Substance dependence during pregnancy (HCC)   Relevant Medications   cyclobenzaprine (FLEXERIL) 10 MG tablet   Other Relevant Orders   ToxAssure Flex 15, Ur   Cervical high risk HPV (human papillomavirus) test positive   Alpha thalassemia silent carrier   Other Visit Diagnoses       Nausea and vomiting during pregnancy       Relevant Medications   ondansetron (ZOFRAN-ODT) 4 MG disintegrating tablet   promethazine (PHENERGAN) 25 MG tablet       Routine preventative health maintenance measures emphasized. Please refer to After Visit Summary for other counseling recommendations.   Return in about 4 weeks (around 07/19/2023) for REACH clinic, ob visit.    Total face-to-face time with patient: 10 minutes.  Over 50% of encounter was spent on counseling and coordination of care.   Hubert Azure, NNP-BC Neonatal Nurse Practitioner Substance Exposed Newborn Consult at the [facility] [phone]

## 2023-06-21 NOTE — Patient Instructions (Signed)
 Birth Control Options Birth control is also called contraception. Birth control prevents pregnancy. There are many types of birth control. Work with your health care provider to find the best option for you. Birth control that uses hormones These types of birth control have hormones in them to prevent pregnancy. Birth control implant This is a small tube that is put into the skin of your arm. The tube can stay in for up to 3 years. Birth control shot These are shots you get every 3 months. Birth control pills This is a pill you take every day. You need to take it at the same time each day. Birth control patch This is a patch that you put on your skin. You change it 1 time each week for 3 weeks. After that, you take the patch off for 1 week. Vaginal ring  This is a soft plastic ring that you put in your vagina. The ring is left in for 3 weeks. Then, you take it out for 1 week. Then, you put a new ring in. Barrier methods  Female condom This is a thin covering that you put on the penis before sex. The condom is thrown away after sex. Female condom This is a soft, loose covering that you put in the vagina before sex. The condom is thrown away after sex. Diaphragm A diaphragm is a soft barrier that is shaped like a bowl. It must be made to fit your body. You put it in the vagina before sex with a chemical that kills sperm called spermicide. A diaphragm should be left in the vagina for 6-8 hours after sex and taken out within 24 hours. You need to replace a diaphragm: Every 1-2 years. After giving birth. After gaining more than 15 lb (6.8 kg). If you have surgery on your pelvis. Cervical cap This is a small, soft cup that fits over the cervix. The cervix is the lowest part of the uterus. It's put in the vagina before sex, along with spermicide. The cap must be made for you. The cap should be left in for 6-8 hours after sex. It is taken out within 48 hours. A cervical cap must be prescribed  and fit to your body by a provider. It should be replaced every 2 years. Sponge This is a small sponge that is put into the vagina before sex. It must be left in for at least 6 hours after sex. It must be taken out within 30 hours and thrown away. Spermicides These are chemicals that kill or stop sperm from getting into the uterus. They may be a pill, cream, jelly, or foam that you put into your vagina. They should be used at least 10-15 minutes before sex. Intrauterine device An intrauterine device (IUD) is a device that's put in the uterus by a provider. There are two types: Hormone IUD. This kind can stay in for 3-5 years. Copper IUD. This kind can stay in for 10 years. Permanent birth control Female tubal ligation This is surgery to block the fallopian tubes. Female sterilization This is a surgery, called a vasectomy, to tie off the tubes that carry sperm in men. This method takes 3 months to work. Other forms of birth control must be used for 3 months. Natural planning methods This means not having sex on the days the female partner could get pregnant. Here are some types of natural planning birth control: Using a calendar: To keep track of the length of each menstrual cycle. To find  out what days pregnancy can happen. To plan to not have sex on days when pregnancy can happen. Watching for signs of ovulation and not having sex during this time. The female partner can check for ovulation by keeping track of their temperature each day. They can also look for changes in the mucus that comes from the cervix. Where to find more information Centers for Disease Control and Prevention: TonerPromos.no. Then: Enter "birth control" in the search box. This information is not intended to replace advice given to you by your health care provider. Make sure you discuss any questions you have with your health care provider. Document Revised: 01/06/2023 Document Reviewed: 06/01/2022 Elsevier Patient Education   2024 Elsevier Inc.                 Safe Medications in Pregnancy    Acne: Benzoyl Peroxide Salicylic Acid  Backache/Headache: Tylenol: 2 regular strength every 4 hours OR              2 Extra strength every 6 hours  Colds/Coughs/Allergies: Benadryl (alcohol free) 25 mg every 6 hours as needed Breath right strips Claritin Cepacol throat lozenges Chloraseptic throat spray Cold-Eeze- up to three times per day Cough drops, alcohol free Flonase (by prescription only) Guaifenesin Mucinex Robitussin DM (plain only, alcohol free) Saline nasal spray/drops Sudafed (pseudoephedrine) & Actifed ** use only after [redacted] weeks gestation and if you do not have high blood pressure Tylenol Vicks Vaporub Zinc lozenges Zyrtec   Constipation: Colace Ducolax suppositories Fleet enema Glycerin suppositories Metamucil Milk of magnesia Miralax Senokot Smooth move tea  Diarrhea: Kaopectate Imodium A-D  *NO pepto Bismol  Hemorrhoids: Anusol Anusol HC Preparation H Tucks  Indigestion: Tums Maalox Mylanta Zantac  Pepcid  Insomnia: Benadryl (alcohol free) 25mg  every 6 hours as needed Tylenol PM Unisom, no Gelcaps  Leg Cramps: Tums MagGel  Nausea/Vomiting:  Bonine Dramamine Emetrol Ginger extract Sea bands Meclizine  Nausea medication to take during pregnancy:  Unisom (doxylamine succinate 25 mg tablets) Take one tablet daily at bedtime. If symptoms are not adequately controlled, the dose can be increased to a maximum recommended dose of two tablets daily (1/2 tablet in the morning, 1/2 tablet mid-afternoon and one at bedtime). Vitamin B6 100mg  tablets. Take one tablet twice a day (up to 200 mg per day).  Skin Rashes: Aveeno products Benadryl cream or 25mg  every 6 hours as needed Calamine Lotion 1% cortisone cream  Yeast infection: Gyne-lotrimin 7 Monistat 7   **If taking multiple medications, please check labels to avoid duplicating the same active  ingredients **take medication as directed on the label ** Do not exceed 4000 mg of tylenol in 24 hours **Do not take medications that contain aspirin or ibuprofen

## 2023-06-23 ENCOUNTER — Other Ambulatory Visit (HOSPITAL_BASED_OUTPATIENT_CLINIC_OR_DEPARTMENT_OTHER): Payer: Self-pay

## 2023-06-24 LAB — TOXASSURE FLEX 15, UR
6-ACETYLMORPHINE IA: NEGATIVE ng/mL
7-aminoclonazepam: NOT DETECTED ng/mg{creat}
AMPHETAMINES IA: NEGATIVE ng/mL
Alpha-hydroxyalprazolam: NOT DETECTED ng/mg{creat}
Alpha-hydroxymidazolam: NOT DETECTED ng/mg{creat}
Alpha-hydroxytriazolam: NOT DETECTED ng/mg{creat}
Alprazolam: NOT DETECTED ng/mg{creat}
BARBITURATES IA: NEGATIVE ng/mL
BUPRENORPHINE: NEGATIVE
Benzodiazepines: NEGATIVE
Buprenorphine: NOT DETECTED ng/mg{creat}
CANNABINOIDS IA: NEGATIVE ng/mL
COCAINE METABOLITE IA: NEGATIVE ng/mL
Clonazepam: NOT DETECTED ng/mg{creat}
Creatinine: 29 mg/dL
Desalkylflurazepam: NOT DETECTED ng/mg{creat}
Desmethyldiazepam: NOT DETECTED ng/mg{creat}
Desmethylflunitrazepam: NOT DETECTED ng/mg{creat}
Diazepam: NOT DETECTED ng/mg{creat}
ETHYL ALCOHOL Enzymatic: NEGATIVE g/dL
FENTANYL: NEGATIVE
Fentanyl: NOT DETECTED ng/mg{creat}
Flunitrazepam: NOT DETECTED ng/mg{creat}
Lorazepam: NOT DETECTED ng/mg{creat}
METHADONE IA: NEGATIVE ng/mL
METHADONE MTB IA: NEGATIVE ng/mL
Midazolam: NOT DETECTED ng/mg{creat}
Norbuprenorphine: NOT DETECTED ng/mg{creat}
Norfentanyl: NOT DETECTED ng/mg{creat}
Oxazepam: NOT DETECTED ng/mg{creat}
PHENCYCLIDINE IA: NEGATIVE ng/mL
TAPENTADOL, IA: NEGATIVE ng/mL
TRAMADOL IA: NEGATIVE ng/mL
Temazepam: NOT DETECTED ng/mg{creat}

## 2023-06-24 LAB — OPIATE CLASS, MS, UR RFX
Codeine: NOT DETECTED ng/mg{creat}
Dihydrocodeine: NOT DETECTED ng/mg{creat}
Hydrocodone: NOT DETECTED ng/mg{creat}
Hydromorphone: NOT DETECTED ng/mg{creat}
Morphine: NOT DETECTED ng/mg{creat}
Norcodeine: NOT DETECTED ng/mg{creat}
Norhydrocodone: NOT DETECTED ng/mg{creat}
Normorphine: NOT DETECTED ng/mg{creat}
Opiate Class Confirmation: NEGATIVE

## 2023-06-24 LAB — OXYCODONE CLASS, MS, UR RFX
Noroxycodone: 3497 ng/mg{creat}
Noroxymorphone: 1634 ng/mg{creat}
Oxycodone Class Confirmation: POSITIVE
Oxycodone: 3483 ng/mg{creat}
Oxymorphone: 3255 ng/mg{creat}

## 2023-06-28 ENCOUNTER — Other Ambulatory Visit (HOSPITAL_BASED_OUTPATIENT_CLINIC_OR_DEPARTMENT_OTHER): Payer: Self-pay

## 2023-07-01 ENCOUNTER — Other Ambulatory Visit (HOSPITAL_BASED_OUTPATIENT_CLINIC_OR_DEPARTMENT_OTHER): Payer: Self-pay

## 2023-07-04 DIAGNOSIS — D563 Thalassemia minor: Secondary | ICD-10-CM | POA: Insufficient documentation

## 2023-07-04 DIAGNOSIS — F1120 Opioid dependence, uncomplicated: Secondary | ICD-10-CM | POA: Insufficient documentation

## 2023-07-12 ENCOUNTER — Ambulatory Visit: Payer: Medicaid Other

## 2023-07-12 ENCOUNTER — Telehealth: Admitting: Advanced Practice Midwife

## 2023-07-12 ENCOUNTER — Other Ambulatory Visit: Payer: Self-pay

## 2023-07-12 ENCOUNTER — Ambulatory Visit: Payer: Medicaid Other | Admitting: Obstetrics

## 2023-07-12 ENCOUNTER — Ambulatory Visit: Payer: Medicaid Other | Attending: Family Medicine

## 2023-07-12 ENCOUNTER — Encounter: Admitting: Advanced Practice Midwife

## 2023-07-12 ENCOUNTER — Encounter: Payer: Self-pay | Admitting: Family Medicine

## 2023-07-12 ENCOUNTER — Other Ambulatory Visit (HOSPITAL_BASED_OUTPATIENT_CLINIC_OR_DEPARTMENT_OTHER): Payer: Self-pay

## 2023-07-12 VITALS — BP 118/65 | HR 87

## 2023-07-12 DIAGNOSIS — F1120 Opioid dependence, uncomplicated: Secondary | ICD-10-CM

## 2023-07-12 DIAGNOSIS — Z3A20 20 weeks gestation of pregnancy: Secondary | ICD-10-CM

## 2023-07-12 DIAGNOSIS — D573 Sickle-cell trait: Secondary | ICD-10-CM

## 2023-07-12 DIAGNOSIS — O09522 Supervision of elderly multigravida, second trimester: Secondary | ICD-10-CM | POA: Insufficient documentation

## 2023-07-12 DIAGNOSIS — O99012 Anemia complicating pregnancy, second trimester: Secondary | ICD-10-CM

## 2023-07-12 DIAGNOSIS — D563 Thalassemia minor: Secondary | ICD-10-CM

## 2023-07-12 DIAGNOSIS — F1920 Other psychoactive substance dependence, uncomplicated: Secondary | ICD-10-CM | POA: Insufficient documentation

## 2023-07-12 DIAGNOSIS — O0990 Supervision of high risk pregnancy, unspecified, unspecified trimester: Secondary | ICD-10-CM

## 2023-07-12 DIAGNOSIS — O4440 Low lying placenta NOS or without hemorrhage, unspecified trimester: Secondary | ICD-10-CM | POA: Insufficient documentation

## 2023-07-12 DIAGNOSIS — O09529 Supervision of elderly multigravida, unspecified trimester: Secondary | ICD-10-CM | POA: Insufficient documentation

## 2023-07-12 DIAGNOSIS — G8929 Other chronic pain: Secondary | ICD-10-CM

## 2023-07-12 DIAGNOSIS — O99322 Drug use complicating pregnancy, second trimester: Secondary | ICD-10-CM

## 2023-07-12 DIAGNOSIS — F4312 Post-traumatic stress disorder, chronic: Secondary | ICD-10-CM | POA: Diagnosis not present

## 2023-07-12 DIAGNOSIS — O34219 Maternal care for unspecified type scar from previous cesarean delivery: Secondary | ICD-10-CM | POA: Insufficient documentation

## 2023-07-12 DIAGNOSIS — F1320 Sedative, hypnotic or anxiolytic dependence, uncomplicated: Secondary | ICD-10-CM

## 2023-07-12 DIAGNOSIS — O99320 Drug use complicating pregnancy, unspecified trimester: Secondary | ICD-10-CM

## 2023-07-12 DIAGNOSIS — M549 Dorsalgia, unspecified: Secondary | ICD-10-CM

## 2023-07-12 DIAGNOSIS — R87810 Cervical high risk human papillomavirus (HPV) DNA test positive: Secondary | ICD-10-CM

## 2023-07-12 MED ORDER — SUBOXONE 8-2 MG SL FILM
1.0000 | ORAL_FILM | Freq: Three times a day (TID) | SUBLINGUAL | 0 refills | Status: DC
  Filled 2023-07-12: qty 42, 14d supply, fill #0

## 2023-07-12 MED ORDER — SUBOXONE 2-0.5 MG SL FILM
1.0000 | ORAL_FILM | Freq: Three times a day (TID) | SUBLINGUAL | 0 refills | Status: AC
  Filled 2023-07-12 – 2023-08-09 (×2): qty 42, 14d supply, fill #0

## 2023-07-12 NOTE — Progress Notes (Signed)
 Subjective:   Virtual Visit via Video Note  I connected with [name] on 07/12/23 at  3:15 PM EDT by a video enabled telemedicine application and verified that I am speaking with the correct person using two identifiers.  Location: Patient: Car Provider: Private exam room   I discussed the limitations of evaluation and management by telemedicine and the availability of in person appointments. The patient expressed understanding and agreed to proceed.  [name] Moreno is a 43 y.o. [phone] at [redacted]w[redacted]d being seen today for ongoing prenatal care.  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  Patient reports backache and restlessness    Chronic pain not controlled with Suboxone, but denies withdrawal Sx on current dose. Had been taking some leftover Percocet from Rx last year but ran out.  Tried to make an appt with D.r Boies, pain management specialist but stated they would not schedule her without formal referral.  Contractions: Not present. Vag. Bleeding: None.  Movement: Present. Denies leaking of fluid.   The following portions of the patient's history were reviewed and updated as appropriate: allergies, current medications, past family history, past medical history, past social history, past surgical history and problem  list. Problem list updated.  On review of ToxAssure, results were pos for for Oxycodone (which pt reported taking remainder of old Rx) and neg for buprenorphine. Pt reports that she started Suboxone after Rx at 2/18 visit and has been taking them as directed. C  Objective:  There were no vitals filed for this visit.  Fetal Status:     Movement: Present     General:  Alert, oriented and cooperative. Patient is in no acute distress.  Skin: Skin is warm and dry. No rash noted.   Cardiovascular: Normal heart rate noted  Respiratory: Normal respiratory effort, no problems with respiration noted  Abdomen: Soft, gravid, appropriate for gestational age. Pain/Pressure: Absent     Pelvic: Vag. Bleeding: None     Cervical exam deferred        Extremities: Normal range of motion.  Edema: None  Mental Status: Normal mood and affect. Normal behavior. Normal judgment and thought content.   Urinalysis:       PDMP reviewed during this encounter.   Last UDS: Lab Results  Component Value Date   CREATIUR 29 06/21/2023     Assessment and Plan:  Pregnancy: [id number] at [redacted]w[redacted]d  1. Substance dependence during pregnancy (HCC) (Primary) - Tox screen not C/W Rx. Presence of oxycodone reasonably explained by prior large Rx, but no apparent reason for absence of Buprenorphine identified. Denies withdrawal Sx but reports thinking that she is "pooping it out". Denies diarrhea.  - recommend Tox screen at [place].   2. Suboxone maintenance treatment complicating pregnancy, antepartum, second trimester (HCC) - See above - Refill sent. Conformed with pharmacist that is was able to be filled.   Buprenorphine HCl-Naloxone HCl (SUBOXONE) 2-0.5 MG FILM        Place 1 Film under the tongue 3 (three) times daily for 14 days.,  Starting Tue 07/12/2023, Until Tue 07/26/2023, Normal   3. Previous cesarean section complicating pregnancy G1 SVD, abruption G2 SAB G3 C/S for fetal indication, abruption - Desires RCS+BTL -  Sign BTL form at 28 wks  4. Chronic posttraumatic stress disorder - Not addressed at this visit, previously declined psych referral   5. Anemia affecting pregnancy in second trimester - On PO iron  6. Alpha Thal carrier - Partner kit mailed  7. Benzodiazepine dependence (HCC) - Neg on Tx screen  8. 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 Will request appt.  9. Supervision of high risk pregnancy, antepartum - Antenatal testing per MFM  10. [redacted] weeks gestation of pregnancy - Nml anatomy US. LL placenta  11. Chronic bilateral back pain, unspecified back location - Suboxone is not helping. Taking old Oxy. Will hold off on increasing Suboxone and try to ad - AMB referral to sports medicine since that is where Dr. Huntley Estelle ([facility] management) works.   12. Low-lying placenta - Re-eval at 28 weeks - Pelvic rest  Preterm labor symptoms and general obstetric precautions including but not limited to vaginal bleeding, contractions, leaking of fluid and fetal movement were reviewed in detail with the patient. Please refer to After Visit Summary for other counseling recommendations.  No follow-ups on file.   Katrinka Blazing, [place], CNM

## 2023-07-12 NOTE — Patient Instructions (Addendum)
 [facility]  Address: [address], [place], [place] [address] Phone: [phone]

## 2023-07-12 NOTE — Progress Notes (Signed)
 MFM Consult Note  [name] Moreno is currently at 20 weeks and 3 days.  She was seen due to advanced maternal age (43 years old).  She is currently being treated with Suboxone replacement therapy.  She denies any problems in her current pregnancy.    She had a cell free DNA test earlier in her pregnancy which indicated a low risk for trisomy 80, 54, and 13. A female fetus is predicted.   Her Horizon test indicated that she is a carrier for alpha thalassemia.  The FOB has not been screened to determine if he is also a carrier for this condition.  She was informed that the fetal growth and amniotic fluid level were appropriate for her gestational age.   On today's exam, an intracardiac echogenic focus was noted in the left ventricle of the fetal heart.    The small association between an echogenic focus and Down syndrome was discussed.   Due to the echogenic focus noted today along with advanced maternal age, the patient was offered and declined an amniocentesis today for definitive diagnosis of fetal aneuploidy.  She reports that she is comfortable with her negative cell free DNA test.  The views of the fetal anatomy were limited today due to the fetal position.  Other than the echogenic focus, what was visualized today appeared within normal limits.  A low-lying placenta was also noted on today's exam.  She was reassured that the placenta will most likely move away from the lower uterine segment later in her pregnancy.  The patient was informed that anomalies may be missed due to technical limitations. If the fetus is in a suboptimal position or maternal habitus is increased, visualization of the fetus in the maternal uterus may be impaired.  The following were discussed during today's consultation:  Carrier for alpha thalassemia  The patient was advised that alpha thalassemia is inherited in an autosomal recessive fashion.   She was advised to have her partner tested to determine if he  is also a carrier for this condition.    Should he screen negative for alpha thalassemia, their child should not be at risk for inheriting this disease.  Should both parents be carriers for this condition, their child may have a 25% chance of inheriting the disease.    Our genetic counselor will send a saliva kit to the patient's house to have her partner screened as soon as possible.  Should both parents be identified as carriers for this condition, she understands that an amniocentesis is available for definitive prenatal diagnosis.  Suboxone treatment in pregnancy  She was advised that her baby may require a prolonged hospitalization to be weaned off opioids after delivery.    She was reassured that most women on Suboxone treatment generally have a good pregnancy/neonatal outcome.  As she is over the age of 54, weekly fetal testing is recommended starting at around 34 weeks.  A follow-up exam was scheduled in 4 weeks to assess the fetal growth and to complete the views of the fetal anatomy.  The patient stated that all of her questions were answered today.  A total of 45 minutes was spent counseling and coordinating the care for this patient.  Greater than 50% of the time was spent in direct face-to-face contact.

## 2023-07-13 ENCOUNTER — Other Ambulatory Visit (HOSPITAL_BASED_OUTPATIENT_CLINIC_OR_DEPARTMENT_OTHER): Payer: Self-pay

## 2023-07-19 ENCOUNTER — Encounter: Admitting: Family Medicine

## 2023-07-22 ENCOUNTER — Ambulatory Visit: Admitting: Pain Medicine

## 2023-07-25 ENCOUNTER — Other Ambulatory Visit (HOSPITAL_BASED_OUTPATIENT_CLINIC_OR_DEPARTMENT_OTHER): Payer: Self-pay

## 2023-08-09 ENCOUNTER — Ambulatory Visit: Attending: Obstetrics | Admitting: Maternal & Fetal Medicine

## 2023-08-09 ENCOUNTER — Other Ambulatory Visit: Payer: Self-pay | Admitting: *Deleted

## 2023-08-09 ENCOUNTER — Ambulatory Visit (HOSPITAL_BASED_OUTPATIENT_CLINIC_OR_DEPARTMENT_OTHER)

## 2023-08-09 ENCOUNTER — Other Ambulatory Visit (HOSPITAL_BASED_OUTPATIENT_CLINIC_OR_DEPARTMENT_OTHER): Payer: Self-pay

## 2023-08-09 ENCOUNTER — Ambulatory Visit: Admitting: Family Medicine

## 2023-08-09 ENCOUNTER — Encounter: Admitting: Family Medicine

## 2023-08-09 VITALS — BP 127/67 | HR 92

## 2023-08-09 VITALS — BP 131/68 | HR 93 | Wt 191.2 lb

## 2023-08-09 DIAGNOSIS — K5900 Constipation, unspecified: Secondary | ICD-10-CM | POA: Diagnosis not present

## 2023-08-09 DIAGNOSIS — O34219 Maternal care for unspecified type scar from previous cesarean delivery: Secondary | ICD-10-CM | POA: Diagnosis not present

## 2023-08-09 DIAGNOSIS — O4442 Low lying placenta NOS or without hemorrhage, second trimester: Secondary | ICD-10-CM | POA: Diagnosis not present

## 2023-08-09 DIAGNOSIS — O09522 Supervision of elderly multigravida, second trimester: Secondary | ICD-10-CM

## 2023-08-09 DIAGNOSIS — O99320 Drug use complicating pregnancy, unspecified trimester: Secondary | ICD-10-CM | POA: Diagnosis not present

## 2023-08-09 DIAGNOSIS — O99322 Drug use complicating pregnancy, second trimester: Secondary | ICD-10-CM | POA: Diagnosis not present

## 2023-08-09 DIAGNOSIS — G8929 Other chronic pain: Secondary | ICD-10-CM

## 2023-08-09 DIAGNOSIS — Z3A24 24 weeks gestation of pregnancy: Secondary | ICD-10-CM

## 2023-08-09 DIAGNOSIS — Z363 Encounter for antenatal screening for malformations: Secondary | ICD-10-CM | POA: Diagnosis not present

## 2023-08-09 DIAGNOSIS — O321XX0 Maternal care for breech presentation, not applicable or unspecified: Secondary | ICD-10-CM | POA: Insufficient documentation

## 2023-08-09 DIAGNOSIS — F1920 Other psychoactive substance dependence, uncomplicated: Secondary | ICD-10-CM | POA: Diagnosis not present

## 2023-08-09 DIAGNOSIS — O0990 Supervision of high risk pregnancy, unspecified, unspecified trimester: Secondary | ICD-10-CM

## 2023-08-09 DIAGNOSIS — O219 Vomiting of pregnancy, unspecified: Secondary | ICD-10-CM

## 2023-08-09 DIAGNOSIS — F1120 Opioid dependence, uncomplicated: Secondary | ICD-10-CM

## 2023-08-09 DIAGNOSIS — O4440 Low lying placenta NOS or without hemorrhage, unspecified trimester: Secondary | ICD-10-CM

## 2023-08-09 DIAGNOSIS — F1910 Other psychoactive substance abuse, uncomplicated: Secondary | ICD-10-CM

## 2023-08-09 DIAGNOSIS — O4450 Low lying placenta with hemorrhage, unspecified trimester: Secondary | ICD-10-CM

## 2023-08-09 DIAGNOSIS — O99012 Anemia complicating pregnancy, second trimester: Secondary | ICD-10-CM

## 2023-08-09 DIAGNOSIS — M546 Pain in thoracic spine: Secondary | ICD-10-CM

## 2023-08-09 DIAGNOSIS — R87810 Cervical high risk human papillomavirus (HPV) DNA test positive: Secondary | ICD-10-CM

## 2023-08-09 DIAGNOSIS — D649 Anemia, unspecified: Secondary | ICD-10-CM

## 2023-08-09 DIAGNOSIS — D563 Thalassemia minor: Secondary | ICD-10-CM

## 2023-08-09 MED ORDER — POLYETHYLENE GLYCOL 3350 17 GM/SCOOP PO POWD
17.0000 g | Freq: Every day | ORAL | 1 refills | Status: DC | PRN
Start: 2023-08-09 — End: 2023-10-18
  Filled 2023-08-09: qty 510, 30d supply, fill #0

## 2023-08-09 MED ORDER — DICLEGIS 10-10 MG PO TBEC
DELAYED_RELEASE_TABLET | ORAL | 3 refills | Status: DC
  Filled 2023-08-09: qty 60, 15d supply, fill #0

## 2023-08-09 NOTE — Progress Notes (Signed)
   Subjective:   [name] Moreno is a 43 y.o. [phone] here today for ongoing substance exposed newborn consults.   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  Suspect this was made worse due to prednisone taper.  Will start PPI at 40 mg twice daily for 2 weeks then 40 mg daily.  Consider referral to GI for no improvement or worsening signs and symptoms.     We did discuss dietary triggers and recommendations for controlling GERD     Headache    Heart murmur    Multinodular goiter    Thyroid  nodule     Past Surgical History:  Procedure Laterality Date   CESAREAN SECTION N/A 01/24/2018   Procedure: CESAREAN SECTION;  Surgeon: Jan Mcgill, MD;  Location: [facility] BIRTHING SUITES;  Service: Obstetrics;  Laterality: N/A;    The following portions of the patient's history were reviewed and updated as appropriate: allergies, current medications, past family history, past medical history, past social history, past surgical history and problem list.     Objective:   [name] Moreno) is well appearing in no acute distress. She has linear thinking and clear communication.    Assessment and Plan:  Met with [name] ([name] Moreno) today at [facility] for ongoing substance exposed newborn consult. We discussed her ongoing prenatal care and expectations for infant following delivery. She stated that she continues to try to take her suboxone  prescription, however it makes her feel sick/have loose stools. Encouraged [name] Moreno to discuss her symptoms with Dr. [name].   Encouraged [name] Moreno) to contact NAS consult phone in between appointments with any  further questions or concerns.     Problem List Items Addressed This Visit       Other   Chronic midline thoracic back pain   Supervision of high risk pregnancy, antepartum   Previous cesarean section complicating pregnancy   Substance dependence during pregnancy (HCC)   Cervical high risk HPV (human papillomavirus) test positive   Suboxone  maintenance treatment complicating pregnancy, antepartum, second trimester (HCC) - Primary   Relevant Orders   ToxAssure Flex 15, Ur   Low-lying placenta   AMA (advanced maternal age) multigravida 35+   Other Visit Diagnoses       Constipation, unspecified constipation type       Relevant Medications   polyethylene glycol powder (GLYCOLAX /MIRALAX ) 17 GM/SCOOP powder     [redacted] weeks gestation of pregnancy         Nausea and vomiting during pregnancy           Routine preventative health maintenance measures emphasized. Please refer to After Visit Summary for other counseling recommendations.   Return in about 2 weeks (around 08/23/2023) for ROB/GTT/Colpo (Pt is REACH but needs am appt for GTT).    Total face-to-face time with patient: 20 minutes.  Over 50% of encounter was spent on counseling and coordination of care.   Jayson Michael, NNP-BC Neonatal Nurse Practitioner Substance Exposed Newborn Consult at the [facility] [phone]

## 2023-08-09 NOTE — Progress Notes (Signed)
   Subjective:  [name] Moreno is a 43 y.o. [phone] at [redacted]w[redacted]d being seen today for ongoing prenatal care.  She is currently monitored for the following issues for this high-risk pregnancy and has Multinodular goiter; Depression; History of gestational diabetes; Anemia affecting pregnancy; Nontoxic uninodular goiter; Anxiety; Chronic midline thoracic back pain; Chronic posttraumatic stress disorder; Supervision of high risk pregnancy, antepartum; Previous cesarean section complicating pregnancy; Chronic narcotic dependence (HCC); Benzodiazepine dependence (HCC); Substance dependence during pregnancy (HCC); Cervical high risk HPV (human papillomavirus) test positive; Alpha Thal carrier; Suboxone  maintenance treatment complicating pregnancy, antepartum, second trimester (HCC); Low-lying placenta; and AMA (advanced maternal age) multigravida 35+ on their problem list.  Patient reports  chronic low back .  Contractions: Irregular. Vag. Bleeding: None.  Movement: Present. Denies leaking of fluid.   The following portions of the patient's history were reviewed and updated as appropriate: allergies, current medications, past family history, past medical history, past social history, past surgical history and problem list. Problem list updated.  Objective:   Vitals:   08/09/23 1549  BP: 131/68  Pulse: 93  Weight: 191 lb 3.2 oz (86.7 kg)    Fetal Status: Fetal Heart Rate (bpm): 155   Movement: Present     General:  Alert, oriented and cooperative. Patient is in no acute distress.  Skin: Skin is warm and dry. No rash noted.   Cardiovascular: Normal heart rate noted  Respiratory: Normal respiratory effort, no problems with respiration noted  Abdomen: Soft, gravid, appropriate for gestational age. Pain/Pressure: Absent     Pelvic: Vag. Bleeding: None     Cervical exam deferred        Extremities: Normal range of motion.  Edema: Trace  Mental Status: Normal mood and affect. Normal behavior. Normal  judgment and thought content.   Urinalysis:      PDMP reviewed during this encounter.   Last UDS: Lab Results  Component Value Date   CREATIUR 29 06/21/2023     Assessment and Plan:  Pregnancy: [id number] at [redacted]w[redacted]d  1. Suboxone  maintenance treatment complicating pregnancy, antepartum, second trimester (HCC) (Primary) - Unable to leave urine for ToxAssure. Strongly Encouraged to do so.  2. Constipation, unspecified constipation type - polyethylene glycol powder (GLYCOLAX /MIRALAX ) 17 GM/SCOOP powder; Take 17 g by mouth daily as needed.  Dispense: 510 g; Refill: 1  3. Substance dependence during pregnancy (HCC)  4. Supervision of high risk pregnancy, antepartum  5. Low-lying placenta - US  today  6. Chronic midline thoracic back pain - Pain uncontrolled w/ Suboxone  2 TID - Scheduled w Dr. Rollene Clink 4/25  7. Cervical high risk HPV (human papillomavirus) test positive - Declined recommended Coplo today. Plans NV. Info given.   8. Multigravida of advanced maternal age in second trimester  35. [redacted] weeks gestation of pregnancy  10. Nausea and vomiting during pregnancy - Requests Diclegis . Rx sent  11. Previous cesarean section complicating pregnancy - Plans repeat w BTL  Preterm labor symptoms and general obstetric precautions including but not limited to vaginal bleeding, contractions, leaking of fluid and fetal movement were reviewed in detail with the patient. Please refer to After Visit Summary for other counseling recommendations.  Return in about 2 weeks (around 08/23/2023) for ROB/GTT/Colpo (Pt is REACH but needs am appt for GTT).   Felipe Horton, Reakwon Barren , CNM

## 2023-08-09 NOTE — Addendum Note (Signed)
 Addended by: Felipe Horton, Jakara Blatter  on: 08/09/2023 04:51 PM   Modules accepted: Orders

## 2023-08-09 NOTE — Progress Notes (Signed)
 Patient information  Patient Name: [name] Moreno  Patient MRN:   [id number]  Referring practice: MFM Referring Provider: [facility])  MFM CONSULT  [name] Moreno is a 43 y.o. [id number] at [redacted]w[redacted]d here for ultrasound and consultation. Patient Active Problem List   Diagnosis Date Noted   Low-lying placenta 07/12/2023   AMA (advanced maternal age) multigravida 35+ 07/12/2023   Alpha Thal carrier 07/04/2023   Suboxone  maintenance treatment complicating pregnancy, antepartum, second trimester (HCC) 07/04/2023   Cervical high risk HPV (human papillomavirus) test positive 06/13/2023   Supervision of high risk pregnancy, antepartum 05/19/2023   Previous cesarean section complicating pregnancy 05/19/2023   Chronic narcotic dependence (HCC) 05/19/2023   Benzodiazepine dependence (HCC) 05/19/2023   Substance dependence during pregnancy (HCC) 05/19/2023   Chronic midline thoracic back pain 06/12/2019   Chronic posttraumatic stress disorder 06/12/2019   Anxiety 02/06/2018   Anemia affecting pregnancy 01/10/2018   History of gestational diabetes 12/03/2017   Nontoxic uninodular goiter 11/13/2013   Depression    Multinodular goiter 01/04/2012    [name] Moreno is doing well today with no acute concerns.  She is compliant with her Suboxone  therapy.  She denies illicit drug or drug use.  She is aware of the need for neonatal abstinence syndrome limited because of monitor the newborn after birth. I discussed that the low lying placenta is still present but will likely resolve.  She denies evidence of vaginal bleeding.  Will continue to assess the placenta future ultrasounds.    Sonographic findings Single intrauterine pregnancy at 24w 3d.  Fetal cardiac activity:  Observed and appears normal. Presentation: Breech. Interval fetal anatomy appears normal. Fetal biometry shows the estimated fetal weight at the 72 percentile. Amniotic fluid volume: Within normal  limits. MVP: 7.19 cm. Placenta: Anterior, low-lying, 1.2 cm from int os.  There are limitations of prenatal ultrasound such as the inability to detect certain abnormalities due to poor visualization. Various factors such as fetal position, gestational age and maternal body habitus may increase the difficulty in visualizing the fetal anatomy.    Recommendations -Serial growth ultrasounds every 4-5 weeks until delivery -Antenatal testing to start around 34 weeks due to AMA > 40 -Delivery pending clinical course weeks gestation  Review of Systems: A review of systems was performed and was negative except per HPI   Vitals and Physical Exam    08/09/2023    2:41 PM 07/12/2023   10:14 AM 06/21/2023    3:08 PM  Vitals with BMI  Weight   185 lbs 13 oz  Systolic 127 118 147  Diastolic 67 65 69  Pulse 92 87 90    Sitting comfortably on the sonogram table Nonlabored breathing Normal rate and rhythm Abdomen is nontender  Past pregnancies OB History  Gravida Para Term Preterm AB Living  4 2 0 2 1 2   SAB IAB Ectopic Multiple Live Births  1 0 0 0 2    # Outcome Date GA Lbr Len/2nd Weight Sex Type Anes PTL Lv  4 Current           3 Preterm 01/24/18 [redacted]w[redacted]d  5 lb 6.8 oz (2.46 kg) F CS-LTranv EPI  LIV  2 SAB 04/19/17     SAB     1 Preterm 05/29/03 [redacted]w[redacted]d    Vag-Spont   LIV     Birth Comments: Had "placental separation" leading to preterm delivery     Complications: Abruptio Placenta     I spent  20 minutes reviewing the patients chart, including labs and images as well as counseling the patient about her medical conditions. Greater than 50% of the time was spent in direct face-to-face patient counseling.  [name]  MFM, [facility]   08/09/2023  3:25 PM

## 2023-08-09 NOTE — Patient Instructions (Signed)
 TDaP Vaccine Pregnancy Get the Whooping Cough Vaccine While You Are Pregnant (CDC)  It is important for women to get the whooping cough vaccine in the third trimester of each pregnancy. Vaccines are the best way to prevent this disease. There are 2 different whooping cough vaccines. Both vaccines combine protection against whooping cough, tetanus and diphtheria, but they are for different age groups: Tdap: for everyone 11 years or older, including pregnant women  DTaP: for children 2 months through 10 years of age  You need the whooping cough vaccine during each of your pregnancies The recommended time to get the shot is during your 27th through 36th week of pregnancy, preferably during the earlier part of this time period. The Centers for Disease Control and Prevention (CDC) recommends that pregnant women receive the whooping cough vaccine for adolescents and adults (called Tdap vaccine) during the third trimester of each pregnancy. The recommended time to get the shot is during your 27th through 36th week of pregnancy, preferably during the earlier part of this time period. This replaces the original recommendation that pregnant women get the vaccine only if they had not previously received it. The [employer] of Obstetricians and Gynecologists and the [employer] support this recommendation.  You should get the whooping cough vaccine while pregnant to pass protection to your baby frame support disabled and/or not supported in this browser  Learn why [name] Moreno decided to get the whooping cough vaccine in her 3rd trimester of pregnancy and how her baby girl was born with some protection against the disease. Also available on YouTube. After receiving the whooping cough vaccine, your body will create protective antibodies (proteins produced by the body to fight off diseases) and pass some of them to your baby before birth. These antibodies provide your baby some short-term  protection against whooping cough in early life. These antibodies can also protect your baby from some of the more serious complications that come along with whooping cough. Your protective antibodies are at their highest about 2 weeks after getting the vaccine, but it takes time to pass them to your baby. So the preferred time to get the whooping cough vaccine is early in your third trimester. The amount of whooping cough antibodies in your body decreases over time. That is why CDC recommends you get a whooping cough vaccine during each pregnancy. Doing so allows each of your babies to get the greatest number of protective antibodies from you. This means each of your babies will get the best protection possible against this disease.  Getting the whooping cough vaccine while pregnant is better than getting the vaccine after you give birth Whooping cough vaccination during pregnancy is ideal so your baby will have short-term protection as soon as he is born. This early protection is important because your baby will not start getting his whooping cough vaccines until he is 2 months old. These first few months of life are when your baby is at greatest risk for catching whooping cough. This is also when he's at greatest risk for having severe, potentially life-threating complications from the infection. To avoid that gap in protection, it is best to get a whooping cough vaccine during pregnancy. You will then pass protection to your baby before he is born. To continue protecting your baby, he should get whooping cough vaccines starting at 2 months old. You may never have gotten the Tdap vaccine before and did not get it during this pregnancy. If so, you should make sure  to get the vaccine immediately after you give birth, before leaving the hospital or birthing center. It will take about 2 weeks before your body develops protection (antibodies) in response to the vaccine. Once you have protection from the vaccine,  you are less likely to give whooping cough to your newborn while caring for him. But remember, your baby will still be at risk for catching whooping cough from others. A recent study looked to see how effective Tdap was at preventing whooping cough in babies whose mothers got the vaccine while pregnant or in the hospital after giving birth. The study found that getting Tdap between 27 through 36 weeks of pregnancy is 85% more effective at preventing whooping cough in babies younger than 2 months old. Blood tests cannot tell if you need a whooping cough vaccine There are no blood tests that can tell you if you have enough antibodies in your body to protect yourself or your baby against whooping cough. Even if you have been sick with whooping cough in the past or previously received the vaccine, you still should get the vaccine during each pregnancy. Breastfeeding may pass some protective antibodies onto your baby By breastfeeding, you may pass some antibodies you have made in response to the vaccine to your baby. When you get a whooping cough vaccine during your pregnancy, you will have antibodies in your breast milk that you can share with your baby as soon as your milk comes in. However, your baby will not get protective antibodies immediately if you wait to get the whooping cough vaccine until after delivering your baby. This is because it takes about 2 weeks for your body to create antibodies. Learn more about the health benefits of breastfeeding.

## 2023-08-10 ENCOUNTER — Other Ambulatory Visit (HOSPITAL_BASED_OUTPATIENT_CLINIC_OR_DEPARTMENT_OTHER): Payer: Self-pay

## 2023-08-11 ENCOUNTER — Other Ambulatory Visit (HOSPITAL_BASED_OUTPATIENT_CLINIC_OR_DEPARTMENT_OTHER): Payer: Self-pay

## 2023-08-12 ENCOUNTER — Ambulatory Visit: Admitting: Pain Medicine

## 2023-08-22 ENCOUNTER — Other Ambulatory Visit: Payer: Self-pay

## 2023-08-22 DIAGNOSIS — O0990 Supervision of high risk pregnancy, unspecified, unspecified trimester: Secondary | ICD-10-CM

## 2023-08-23 ENCOUNTER — Other Ambulatory Visit

## 2023-08-23 ENCOUNTER — Encounter: Admitting: Family Medicine

## 2023-09-07 ENCOUNTER — Ambulatory Visit

## 2023-09-07 ENCOUNTER — Ambulatory Visit: Attending: Maternal & Fetal Medicine

## 2023-09-10 DIAGNOSIS — O99891 Other specified diseases and conditions complicating pregnancy: Secondary | ICD-10-CM | POA: Diagnosis not present

## 2023-09-10 DIAGNOSIS — M79669 Pain in unspecified lower leg: Secondary | ICD-10-CM | POA: Diagnosis not present

## 2023-09-10 DIAGNOSIS — Z3689 Encounter for other specified antenatal screening: Secondary | ICD-10-CM | POA: Diagnosis not present

## 2023-09-10 DIAGNOSIS — Z3A32 32 weeks gestation of pregnancy: Secondary | ICD-10-CM | POA: Diagnosis not present

## 2023-09-10 DIAGNOSIS — R110 Nausea: Secondary | ICD-10-CM | POA: Diagnosis not present

## 2023-09-26 DIAGNOSIS — R519 Headache, unspecified: Secondary | ICD-10-CM | POA: Diagnosis not present

## 2023-09-26 DIAGNOSIS — O99891 Other specified diseases and conditions complicating pregnancy: Secondary | ICD-10-CM | POA: Diagnosis not present

## 2023-09-26 DIAGNOSIS — M549 Dorsalgia, unspecified: Secondary | ICD-10-CM | POA: Diagnosis not present

## 2023-09-26 DIAGNOSIS — Z3A31 31 weeks gestation of pregnancy: Secondary | ICD-10-CM | POA: Diagnosis not present

## 2023-10-04 ENCOUNTER — Other Ambulatory Visit (HOSPITAL_BASED_OUTPATIENT_CLINIC_OR_DEPARTMENT_OTHER): Payer: Self-pay

## 2023-10-04 ENCOUNTER — Encounter: Payer: Self-pay | Admitting: Family Medicine

## 2023-10-04 ENCOUNTER — Ambulatory Visit (INDEPENDENT_AMBULATORY_CARE_PROVIDER_SITE_OTHER): Admitting: Family Medicine

## 2023-10-04 VITALS — BP 115/75 | HR 99 | Wt 201.0 lb

## 2023-10-04 DIAGNOSIS — M546 Pain in thoracic spine: Secondary | ICD-10-CM

## 2023-10-04 DIAGNOSIS — O99343 Other mental disorders complicating pregnancy, third trimester: Secondary | ICD-10-CM | POA: Diagnosis not present

## 2023-10-04 DIAGNOSIS — O0990 Supervision of high risk pregnancy, unspecified, unspecified trimester: Secondary | ICD-10-CM

## 2023-10-04 DIAGNOSIS — O99320 Drug use complicating pregnancy, unspecified trimester: Secondary | ICD-10-CM

## 2023-10-04 DIAGNOSIS — E042 Nontoxic multinodular goiter: Secondary | ICD-10-CM

## 2023-10-04 DIAGNOSIS — K219 Gastro-esophageal reflux disease without esophagitis: Secondary | ICD-10-CM

## 2023-10-04 DIAGNOSIS — O09523 Supervision of elderly multigravida, third trimester: Secondary | ICD-10-CM

## 2023-10-04 DIAGNOSIS — O34219 Maternal care for unspecified type scar from previous cesarean delivery: Secondary | ICD-10-CM

## 2023-10-04 DIAGNOSIS — F4312 Post-traumatic stress disorder, chronic: Secondary | ICD-10-CM

## 2023-10-04 DIAGNOSIS — O99613 Diseases of the digestive system complicating pregnancy, third trimester: Secondary | ICD-10-CM

## 2023-10-04 DIAGNOSIS — Z3009 Encounter for other general counseling and advice on contraception: Secondary | ICD-10-CM

## 2023-10-04 DIAGNOSIS — O4443 Low lying placenta NOS or without hemorrhage, third trimester: Secondary | ICD-10-CM

## 2023-10-04 DIAGNOSIS — R87810 Cervical high risk human papillomavirus (HPV) DNA test positive: Secondary | ICD-10-CM

## 2023-10-04 DIAGNOSIS — O09213 Supervision of pregnancy with history of pre-term labor, third trimester: Secondary | ICD-10-CM

## 2023-10-04 DIAGNOSIS — Z3A32 32 weeks gestation of pregnancy: Secondary | ICD-10-CM

## 2023-10-04 DIAGNOSIS — O219 Vomiting of pregnancy, unspecified: Secondary | ICD-10-CM

## 2023-10-04 DIAGNOSIS — D649 Anemia, unspecified: Secondary | ICD-10-CM

## 2023-10-04 DIAGNOSIS — O99013 Anemia complicating pregnancy, third trimester: Secondary | ICD-10-CM

## 2023-10-04 DIAGNOSIS — Z8632 Personal history of gestational diabetes: Secondary | ICD-10-CM

## 2023-10-04 DIAGNOSIS — O99012 Anemia complicating pregnancy, second trimester: Secondary | ICD-10-CM

## 2023-10-04 DIAGNOSIS — G8929 Other chronic pain: Secondary | ICD-10-CM

## 2023-10-04 DIAGNOSIS — O09522 Supervision of elderly multigravida, second trimester: Secondary | ICD-10-CM

## 2023-10-04 DIAGNOSIS — F1920 Other psychoactive substance dependence, uncomplicated: Secondary | ICD-10-CM

## 2023-10-04 DIAGNOSIS — O4440 Low lying placenta NOS or without hemorrhage, unspecified trimester: Secondary | ICD-10-CM

## 2023-10-04 MED ORDER — BUPRENORPHINE HCL-NALOXONE HCL 8-2 MG SL FILM
1.0000 | ORAL_FILM | Freq: Two times a day (BID) | SUBLINGUAL | 0 refills | Status: DC
  Filled 2023-10-04: qty 30, 15d supply, fill #0

## 2023-10-04 MED ORDER — DOXYLAMINE-PYRIDOXINE 10-10 MG PO TBEC
DELAYED_RELEASE_TABLET | ORAL | 3 refills | Status: DC
  Filled 2023-10-04: qty 60, 15d supply, fill #0

## 2023-10-04 MED ORDER — PROMETHAZINE HCL 25 MG PO TABS
25.0000 mg | ORAL_TABLET | Freq: Four times a day (QID) | ORAL | 1 refills | Status: DC | PRN
  Filled 2023-10-04: qty 30, 8d supply, fill #0

## 2023-10-04 MED ORDER — PANTOPRAZOLE SODIUM 40 MG PO TBEC
40.0000 mg | DELAYED_RELEASE_TABLET | Freq: Every day | ORAL | 3 refills | Status: AC
  Filled 2023-10-04: qty 90, 90d supply, fill #0

## 2023-10-04 MED ORDER — ONDANSETRON 4 MG PO TBDP
4.0000 mg | ORAL_TABLET | Freq: Four times a day (QID) | ORAL | 5 refills | Status: DC | PRN
  Filled 2023-10-04: qty 20, 5d supply, fill #0

## 2023-10-04 NOTE — Patient Instructions (Signed)

## 2023-10-04 NOTE — Progress Notes (Signed)
 Subjective:  [name] Moreno is a 43 y.o. [phone] at [redacted]w[redacted]d being seen today for ongoing prenatal care.  She is currently monitored for the following issues for this high-risk pregnancy and has Multinodular goiter; Depression; History of gestational diabetes; Anemia affecting pregnancy; Nontoxic uninodular goiter; Anxiety; Chronic midline thoracic back pain; Chronic posttraumatic stress disorder; Supervision of high risk pregnancy, antepartum; Previous cesarean section complicating pregnancy; Chronic narcotic dependence (HCC); Benzodiazepine dependence (HCC); Substance dependence during pregnancy (HCC); Cervical high risk HPV (human papillomavirus) test positive; Alpha Thal carrier; Suboxone  maintenance treatment complicating pregnancy, antepartum, second trimester (HCC); Low-lying placenta; AMA (advanced maternal age) multigravida 35+; and Preterm delivery on their problem list.  Patient reports no complaints.  Contractions: Not present. Vag. Bleeding: None.  Movement: Present. Denies leaking of fluid.   The following portions of the patient's history were reviewed and updated as appropriate: allergies, current medications, past family history, past medical history, past social history, past surgical history and problem list. Problem list updated.  Objective:   Vitals:   10/04/23 1624  BP: 115/75  Pulse: 99  Weight: 201 lb (91.2 kg)    Fetal Status: Fetal Heart Rate (bpm): 150   Movement: Present     General:  Alert, oriented and cooperative. Patient is in no acute distress.  Skin: Skin is warm and dry. No rash noted.   Cardiovascular: Normal heart rate noted  Respiratory: Normal respiratory effort, no problems with respiration noted  Abdomen: Soft, gravid, appropriate for gestational age. Pain/Pressure: Present     Pelvic: Vag. Bleeding: None     Cervical exam deferred        Extremities: Normal range of motion.  Edema: Trace  Mental Status: Normal mood and affect. Normal behavior.  Normal judgment and thought content.   Urinalysis:      PDMP not reviewed this encounter.    Last UDS: Lab Results  Component Value Date   CREATIUR 29 06/21/2023      Assessment and Plan:  Pregnancy: [id number] at [redacted]w[redacted]d  1. Supervision of high risk pregnancy, antepartum (Primary) BP and FHR normal Has not yet had third trimester labs, coming back tomorrow for MFM scan, will do labs at that time, 1 hr GTT due to timing Reports she went to [facility], went primarily because of bad headache but also had trouble urinating while she was there. Admitted for a night, was given fluids and sent home. Went back a second time a few weeks after that for nausea and vomiting. 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   2. Substance dependence during pregnancy (HCC) During initial visits was on chronic opioids, using non prescribed supply Primary motivation was chronic back pain, though also readily admitted to symptoms of withdrawal and clear signs of physical dependence Attempted to transition to suboxone  (which she said had been tried previously) as she was continuing to use unprescribed oxy's despite counseling against this Last rx was filled on 08/11/2023 for suboxone  2 mg BID, 14 day supply Had also been referred to Dr. Rollene Clink, no showed last scheduled visit on 08/12/2023  Today she reports she is taking suboxone  but does not feel it is helping  We had a open but clear conversation about the discrepancies in her urine with this history She last filled a prescription for buprenorphine  almost two months ago, for only a two week fill, which does not square with her still  taking the meds Reports she has taken a bup that was left over recently, thinks yesterday Denies any recent use of illicit pills Long conversation in which I emphasized that I will always see her as a patient and care for her during and after pregnancy,  but I may not be able to be her MAT provider if she is not willing to take her medicine regularly and engage in monitoring Emphasized the importance of UDS not to restrict or deny care but to help risk assess and ensure I am not inadvertently participating in diversion Patient understanding of this conversation and left UDS prior to leaving visit Given low dose of medicine agreed to trial of higher dose of suboxone  to see if this will help with ongoing back pain, refill sent - ToxAssure Flex 15, Ur  3. Previous cesarean section complicating pregnancy Desires RCS+BTL BTL form signed today  4. Multinodular goiter Recheck TSH  5. Low-lying placenta Last US  08/09/2023, anterior low lying 1.2 cm from os, no mention of PAS on US  report Has f/u scheduled for tomorrow  6. History of gestational diabetes Needs GTT ASAP, will get 1 hr tomorrow at 1300 when she comes for MFM scan which is scheduled for 1400  7. Chronic posttraumatic stress disorder Has been referred to Psych closer to home however they declined the referral Unsure of local options, will reach out to Psych contact  8. Chronic midline thoracic back pain See above  9. Cervical high risk HPV (human papillomavirus) test positive Needs colpo, deferred to postpartum  10. Anemia affecting pregnancy in second trimester Lab Results  Component Value Date   HGB 10.6 (L) 06/07/2023  mild  11. Multigravida of advanced maternal age in second trimester   37. Preterm delivery At 36 and 35 weeks in setting of placental abruptions  13. Unwanted fertility Strongly desires BTL at time of delivery Attempted to sign papers repeatedly but having difficult with stringent Medicaid rules about letters touching, etc.  Will try again at next visit  Preterm labor symptoms and general obstetric precautions including but not limited to vaginal bleeding, contractions, leaking of fluid and fetal movement were reviewed in detail with the  patient. Please refer to After Visit Summary for other counseling recommendations.  Return in about 2 weeks (around 10/18/2023) for REACH clinic, ob visit.  Future Appointments  Date Time Provider Department Center  10/05/2023  2:00 PM [facility] PROVIDER 1 [facility]  10/05/2023  2:30 PM [facility] US1 [facility]  10/19/2023  2:00 PM [facility] PROVIDER 1 [facility]  10/19/2023  2:15 PM [facility] NST [facility]  10/26/2023  2:00 PM [facility] PROVIDER 1 [facility]  10/26/2023  2:15 PM [facility] NST [facility]  11/02/2023  2:00 PM [facility] PROVIDER 1 [facility]  11/02/2023  2:30 PM [facility] US1 [facility]     [name], Lorinda Root, MD

## 2023-10-04 NOTE — Progress Notes (Signed)
   Subjective:   [name] Moreno is a 43 y.o. [phone] here today for ongoing substance exposed newborn consult.   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  Suspect this was made worse due to prednisone taper.  Will start PPI at 40 mg twice daily for 2 weeks then 40 mg daily.  Consider referral to GI for no improvement or worsening signs and symptoms.     We did discuss dietary triggers and recommendations for controlling GERD     Headache    Heart murmur    Multinodular goiter    Thyroid  nodule     Past Surgical History:  Procedure Laterality Date   CESAREAN SECTION N/A 01/24/2018   Procedure: CESAREAN SECTION;  Surgeon: [name] Mcgill, MD;  Location: [facility] BIRTHING SUIT[facility];  Service: Obstetrics;  Laterality: N/A;    The following portions of the patient's history were reviewed and updated as appropriate: allergies, current medications, past family history, past medical history, past social history, past surgical history and problem list.     Objective:   [name] Moreno is well appearing in no acute distress. She has linear thinking and clear communication.      Assessment and Plan:  Met with [name] Moreno today at [facility] for ongoing substance exposed newborn consult. We discussed her ongoing prenatal care and her concerns about dehydration and ongoing nausea/vomiting. Encouraged [name] Moreno to discuss her concerns with Dr. Ilona Moreno today. Reviewed expectations of substance exposed newborn following delivery.    Encouraged [name] Moreno to contact [facility] consult phone in between appointments with any further  questions or concerns.     Problem List Items Addressed This Visit       Endocrine   Multinodular goiter     Other   History of gestational diabetes   Anemia affecting pregnancy   Chronic midline thoracic back pain   Chronic posttraumatic stress disorder   Supervision of high risk pregnancy, antepartum - Primary   Previous cesarean section complicating pregnancy   Substance dependence during pregnancy (HCC)   Relevant Orders   ToxAssure Flex 15, Ur   Cervical high risk HPV (human papillomavirus) test positive   Low-lying placenta   AMA (advanced maternal age) multigravida 35+   Preterm delivery    Routine preventative health maintenance measures emphasized. Please refer to After Visit Summary for other counseling recommendations.   Return in about 2 weeks (around 10/18/2023) for REACH clinic, ob visit.    Total face-to-face time with patient: 20 minutes.  Over 50% of encounter was spent on counseling and coordination of care.   [name] Moreno, NNP-BC Neonatal Nurse Practitioner Substance Exposed Newborn Consult at the [facility] [phone]

## 2023-10-05 ENCOUNTER — Other Ambulatory Visit

## 2023-10-05 ENCOUNTER — Ambulatory Visit

## 2023-10-05 ENCOUNTER — Ambulatory Visit: Attending: Maternal & Fetal Medicine | Admitting: Maternal & Fetal Medicine

## 2023-10-05 VITALS — BP 109/67 | HR 83

## 2023-10-05 DIAGNOSIS — Z8632 Personal history of gestational diabetes: Secondary | ICD-10-CM | POA: Diagnosis not present

## 2023-10-05 DIAGNOSIS — F1120 Opioid dependence, uncomplicated: Secondary | ICD-10-CM | POA: Insufficient documentation

## 2023-10-05 DIAGNOSIS — O09523 Supervision of elderly multigravida, third trimester: Secondary | ICD-10-CM | POA: Insufficient documentation

## 2023-10-05 DIAGNOSIS — Z3009 Encounter for other general counseling and advice on contraception: Secondary | ICD-10-CM | POA: Insufficient documentation

## 2023-10-05 DIAGNOSIS — O09293 Supervision of pregnancy with other poor reproductive or obstetric history, third trimester: Secondary | ICD-10-CM | POA: Insufficient documentation

## 2023-10-05 DIAGNOSIS — D649 Anemia, unspecified: Secondary | ICD-10-CM | POA: Diagnosis not present

## 2023-10-05 DIAGNOSIS — Z3A32 32 weeks gestation of pregnancy: Secondary | ICD-10-CM

## 2023-10-05 DIAGNOSIS — O99322 Drug use complicating pregnancy, second trimester: Secondary | ICD-10-CM | POA: Diagnosis not present

## 2023-10-05 DIAGNOSIS — Z148 Genetic carrier of other disease: Secondary | ICD-10-CM | POA: Diagnosis not present

## 2023-10-05 DIAGNOSIS — O4450 Low lying placenta with hemorrhage, unspecified trimester: Secondary | ICD-10-CM

## 2023-10-05 DIAGNOSIS — O09522 Supervision of elderly multigravida, second trimester: Secondary | ICD-10-CM

## 2023-10-05 DIAGNOSIS — O99013 Anemia complicating pregnancy, third trimester: Secondary | ICD-10-CM | POA: Diagnosis not present

## 2023-10-05 DIAGNOSIS — O34219 Maternal care for unspecified type scar from previous cesarean delivery: Secondary | ICD-10-CM | POA: Diagnosis not present

## 2023-10-05 DIAGNOSIS — O4452 Low lying placenta with hemorrhage, second trimester: Secondary | ICD-10-CM

## 2023-10-05 DIAGNOSIS — O99323 Drug use complicating pregnancy, third trimester: Secondary | ICD-10-CM | POA: Insufficient documentation

## 2023-10-05 DIAGNOSIS — O0990 Supervision of high risk pregnancy, unspecified, unspecified trimester: Secondary | ICD-10-CM | POA: Diagnosis not present

## 2023-10-05 DIAGNOSIS — E042 Nontoxic multinodular goiter: Secondary | ICD-10-CM

## 2023-10-05 NOTE — Progress Notes (Signed)
 Patient information  Patient Name: [name] Moreno  Patient MRN:   [id number]  Referring practice: MFM Referring Provider: [facility])  Problem List   Patient Active Problem List   Diagnosis Date Noted   Preterm delivery 10/04/2023   Low-lying placenta 07/12/2023   AMA (advanced maternal age) multigravida 35+ 07/12/2023   Alpha Thal carrier 07/04/2023   Suboxone  maintenance treatment complicating pregnancy, antepartum, second trimester (HCC) 07/04/2023   Cervical high risk HPV (human papillomavirus) test positive 06/13/2023   Supervision of high risk pregnancy, antepartum 05/19/2023   Previous cesarean section complicating pregnancy 05/19/2023   Chronic narcotic dependence (HCC) 05/19/2023   Benzodiazepine dependence (HCC) 05/19/2023   Substance dependence during pregnancy (HCC) 05/19/2023   Chronic midline thoracic back pain 06/12/2019   Chronic posttraumatic stress disorder 06/12/2019   Anxiety 02/06/2018   Anemia affecting pregnancy 01/10/2018   History of gestational diabetes 12/03/2017   Nontoxic uninodular goiter 11/13/2013   Depression    Multinodular goiter 01/04/2012   Maternal Fetal medicine Consult  [name] Moreno is a 43 y.o. [id number] at [redacted]w[redacted]d here for ultrasound and consultation. [name] Moreno is doing well today with no acute concerns. Today we focused on the following:   The patient is doing well today without any acute concerns.  She is compliant with her medications.  I discussed the low-lying placenta has resolved and delivery can occur at 39 weeks via repeat cesarean delivery or sooner if indicated.  The patient had time to ask questions that were answered to her satisfaction.  She verbalized understanding and agrees to proceed with the plan below.  Sonographic findings Single intrauterine pregnancy at 32w 4d.  Fetal cardiac activity:  Observed and appears normal. Presentation: Cephalic. Interval fetal anatomy appears  normal. Fetal biometry shows the estimated fetal weight at the 61 percentile. Amniotic fluid volume: Within normal limits. MVP: 6.08 cm. Placenta: Anterior.  There are limitations of prenatal ultrasound such as the inability to detect certain abnormalities due to poor visualization. Various factors such as fetal position, gestational age and maternal body habitus may increase the difficulty in visualizing the fetal anatomy.    Recommendations - Continue weekly antenatal testing and serial growth ultrasounds as scheduled. - Repeat cesarean delivery scheduled for 11/19/2023 at 39 weeks.  Review of Systems: A review of systems was performed and was negative except per HPI   Vitals and Physical Exam    10/05/2023    2:18 PM 10/04/2023    4:24 PM 08/09/2023    3:49 PM  Vitals with BMI  Weight  201 lbs 191 lbs 3 oz  Systolic 109 115 147  Diastolic 67 75 68  Pulse 83 99 93    Sitting comfortably on the sonogram table Nonlabored breathing Normal rate and rhythm Abdomen is nontender  Past pregnancies OB History  Gravida Para Term Preterm AB Living  4 2 0 2 1 2   SAB IAB Ectopic Multiple Live Births  1 0 0 0 2    # Outcome Date GA Lbr Len/2nd Weight Sex Type Anes PTL Lv  4 Current           3 Preterm 01/24/18 [redacted]w[redacted]d  5 lb 6.8 oz (2.46 kg) F CS-LTranv EPI  LIV  2 SAB 04/19/17     SAB     1 Preterm 05/29/03 [redacted]w[redacted]d    Vag-Spont   LIV     Birth Comments: Had placental separation leading to preterm delivery     Complications:  Abruptio Placenta     I spent 20 minutes reviewing the patients chart, including labs and images as well as counseling the patient about her medical conditions. Greater than 50% of the time was spent in direct face-to-face patient counseling.  [name]  MFM, [place]   10/05/2023  3:01 PM

## 2023-10-06 ENCOUNTER — Ambulatory Visit: Payer: Self-pay | Admitting: Family Medicine

## 2023-10-06 ENCOUNTER — Ambulatory Visit (HOSPITAL_COMMUNITY)
Admission: RE | Admit: 2023-10-06 | Discharge: 2023-10-06 | Disposition: A | Discharge status: Home / Self Care | Source: Ambulatory Visit | Attending: Family Medicine | Admitting: Family Medicine

## 2023-10-06 ENCOUNTER — Telehealth: Payer: Self-pay | Admitting: Lactation Services

## 2023-10-06 DIAGNOSIS — M79662 Pain in left lower leg: Secondary | ICD-10-CM | POA: Diagnosis present

## 2023-10-06 DIAGNOSIS — O0990 Supervision of high risk pregnancy, unspecified, unspecified trimester: Secondary | ICD-10-CM

## 2023-10-06 LAB — RPR: RPR Ser Ql: NONREACTIVE

## 2023-10-06 LAB — TSH RFX ON ABNORMAL TO FREE T4: TSH: 1.13 u[IU]/mL (ref 0.450–4.500)

## 2023-10-06 LAB — HEMOGLOBIN A1C
Est. average glucose Bld gHb Est-mCnc: 114 mg/dL
Hgb A1c MFr Bld: 5.6 % (ref 4.8–5.6)

## 2023-10-06 LAB — GLUCOSE TOLERANCE, 1 HOUR: Glucose, 1Hr PP: 112 mg/dL (ref 70–199)

## 2023-10-06 LAB — HIV ANTIBODY (ROUTINE TESTING W REFLEX): HIV Screen 4th Generation wRfx: NONREACTIVE

## 2023-10-06 NOTE — Telephone Encounter (Signed)
-----   Message from Jonne Netters sent at 10/06/2023  9:05 AM EDT ----- Regarding: RE: Schedule c-section Dr. [name],  Has the patient signed the medicaid consent form? I am unable to locate it in Epic. Patient will be 39 weeks on 8/2. Patient will need to sign the consent form by 6/30 in order for medicaid to cover her BTL. I have included the clinical staff at [facility].  Thanks, Dejuana ----- Message ----- From: Teena Feast, MD Sent: 10/05/2023   3:42 PM EDT To: Jonne Netters Subject: Schedule c-section                             Please schedule patient for repeat c-section, this will be her second at [redacted] weeks gestation. She does want a BTL.    Thanks, [facility]

## 2023-10-06 NOTE — Telephone Encounter (Signed)
 Called patient back to discuss leg pain. She reports it is only in the left leg in the calf. She denies warmth to the area. Per Dr. [name] she needs to have doppler studies stat. Called and scheduled with [facility] at [address] IR tech will contact patient. PA request sent to Lorriane Rote for approval.

## 2023-10-06 NOTE — Addendum Note (Signed)
 Addended by: Gracie Lav on: 10/06/2023 11:50 AM   Modules accepted: Orders

## 2023-10-06 NOTE — Telephone Encounter (Signed)
 Patient scheduled for doppler studies today. Duncan from [facility] has set up appointment for patient, although she told them she was not sure she could make it. Called and spoke with patient, informed her this is concerning and that she needs to follow up for evaluation. Patient voiced understanding. She is arranging for care for her daughter. She plans to come by to sign BTL consent this afternoon, let her know we are here until 4 today.

## 2023-10-06 NOTE — Telephone Encounter (Signed)
 Called patient to inform her BTL form signed by 10/17/2023 to be able to get her BTL. She reports she will come by in the next week to sign, she is aware that it needs to be signed by 6/30.  She reports she is having pains in her legs that are like charlie horses. She has tried Mustard and eating bananas, it is not helping. She would like for her doctor to know. Forwarded message to Dr. [name].

## 2023-10-07 ENCOUNTER — Other Ambulatory Visit (HOSPITAL_BASED_OUTPATIENT_CLINIC_OR_DEPARTMENT_OTHER): Payer: Self-pay

## 2023-10-09 LAB — TOXASSURE FLEX 15, UR
6-ACETYLMORPHINE IA: NEGATIVE ng/mL
7-aminoclonazepam: 63 ng/mg{creat}
AMPHETAMINES IA: NEGATIVE ng/mL
Alpha-hydroxyalprazolam: 28 ng/mg{creat}
Alpha-hydroxymidazolam: NOT DETECTED ng/mg{creat}
Alpha-hydroxytriazolam: NOT DETECTED ng/mg{creat}
Alprazolam: 16 ng/mg{creat}
BARBITURATES IA: NEGATIVE ng/mL
BUPRENORPHINE: POSITIVE
Benzodiazepines: POSITIVE
Buprenorphine: >629 ng/mg{creat}
CANNABINOIDS IA: NEGATIVE ng/mL
COCAINE METABOLITE IA: NEGATIVE ng/mL
Clonazepam: NOT DETECTED ng/mg{creat}
Creatinine: 159 mg/dL (ref >=20)
Desalkylflurazepam: NOT DETECTED ng/mg{creat}
Desmethyldiazepam: NOT DETECTED ng/mg{creat}
Desmethylflunitrazepam: NOT DETECTED ng/mg{creat}
Diazepam: NOT DETECTED ng/mg{creat}
ETHYL ALCOHOL Enzymatic: NEGATIVE g/dL
FENTANYL: NEGATIVE
Fentanyl: NOT DETECTED ng/mg{creat}
Flunitrazepam: NOT DETECTED ng/mg{creat}
Lorazepam: NOT DETECTED ng/mg{creat}
METHADONE IA: NEGATIVE ng/mL
METHADONE MTB IA: NEGATIVE ng/mL
Midazolam: NOT DETECTED ng/mg{creat}
Norbuprenorphine: NOT DETECTED ng/mg{creat}
Norfentanyl: NOT DETECTED ng/mg{creat}
Oxazepam: NOT DETECTED ng/mg{creat}
PHENCYCLIDINE IA: NEGATIVE ng/mL
TAPENTADOL, IA: NEGATIVE ng/mL
TRAMADOL IA: NEGATIVE ng/mL
Temazepam: NOT DETECTED ng/mg{creat}

## 2023-10-09 LAB — OXYCODONE CLASS, MS, UR RFX
Noroxycodone: >6289 ng/mg{creat}
Noroxymorphone: 4342 ng/mg{creat}
Oxycodone Class Confirmation: POSITIVE
Oxycodone: 6175 ng/mg{creat}
Oxymorphone: >6289 ng/mg{creat}

## 2023-10-09 LAB — OPIATE CLASS, MS, UR RFX
Codeine: NOT DETECTED ng/mg{creat}
Dihydrocodeine: NOT DETECTED ng/mg{creat}
Hydrocodone: 81 ng/mg{creat}
Hydromorphone: 84 ng/mg{creat}
Morphine: NOT DETECTED ng/mg{creat}
Norcodeine: NOT DETECTED ng/mg{creat}
Norhydrocodone: 90 ng/mg{creat}
Normorphine: NOT DETECTED ng/mg{creat}
Opiate Class Confirmation: POSITIVE

## 2023-10-11 ENCOUNTER — Other Ambulatory Visit (HOSPITAL_BASED_OUTPATIENT_CLINIC_OR_DEPARTMENT_OTHER): Payer: Self-pay

## 2023-10-17 DIAGNOSIS — T63481A Toxic effect of venom of other arthropod, accidental (unintentional), initial encounter: Secondary | ICD-10-CM | POA: Diagnosis not present

## 2023-10-17 DIAGNOSIS — T148XXA Other injury of unspecified body region, initial encounter: Secondary | ICD-10-CM | POA: Diagnosis not present

## 2023-10-17 DIAGNOSIS — L299 Pruritus, unspecified: Secondary | ICD-10-CM | POA: Diagnosis not present

## 2023-10-18 ENCOUNTER — Telehealth: Admitting: Advanced Practice Midwife

## 2023-10-18 DIAGNOSIS — O99323 Drug use complicating pregnancy, third trimester: Secondary | ICD-10-CM

## 2023-10-18 DIAGNOSIS — R87810 Cervical high risk human papillomavirus (HPV) DNA test positive: Secondary | ICD-10-CM

## 2023-10-18 DIAGNOSIS — D563 Thalassemia minor: Secondary | ICD-10-CM

## 2023-10-18 DIAGNOSIS — F1920 Other psychoactive substance dependence, uncomplicated: Secondary | ICD-10-CM

## 2023-10-18 DIAGNOSIS — M549 Dorsalgia, unspecified: Secondary | ICD-10-CM

## 2023-10-18 DIAGNOSIS — O0990 Supervision of high risk pregnancy, unspecified, unspecified trimester: Secondary | ICD-10-CM

## 2023-10-18 DIAGNOSIS — O219 Vomiting of pregnancy, unspecified: Secondary | ICD-10-CM | POA: Diagnosis not present

## 2023-10-18 DIAGNOSIS — F1120 Opioid dependence, uncomplicated: Secondary | ICD-10-CM

## 2023-10-18 DIAGNOSIS — O4443 Low lying placenta NOS or without hemorrhage, third trimester: Secondary | ICD-10-CM | POA: Diagnosis not present

## 2023-10-18 DIAGNOSIS — O34219 Maternal care for unspecified type scar from previous cesarean delivery: Secondary | ICD-10-CM

## 2023-10-18 DIAGNOSIS — Z3A34 34 weeks gestation of pregnancy: Secondary | ICD-10-CM

## 2023-10-18 DIAGNOSIS — F1320 Sedative, hypnotic or anxiolytic dependence, uncomplicated: Secondary | ICD-10-CM

## 2023-10-18 DIAGNOSIS — O09523 Supervision of elderly multigravida, third trimester: Secondary | ICD-10-CM

## 2023-10-18 DIAGNOSIS — O4440 Low lying placenta NOS or without hemorrhage, unspecified trimester: Secondary | ICD-10-CM

## 2023-10-18 DIAGNOSIS — O99891 Dorsalgia, unspecified: Secondary | ICD-10-CM

## 2023-10-18 DIAGNOSIS — K5900 Constipation, unspecified: Secondary | ICD-10-CM

## 2023-10-18 DIAGNOSIS — F4312 Post-traumatic stress disorder, chronic: Secondary | ICD-10-CM

## 2023-10-18 MED ORDER — ONDANSETRON 8 MG PO TBDP: 8.0000 mg | ORAL_TABLET | Freq: Four times a day (QID) | ORAL | 3 refills | Status: DC | PRN

## 2023-10-18 MED ORDER — CYCLOBENZAPRINE HCL 5 MG PO TABS: 5.0000 mg | ORAL_TABLET | Freq: Three times a day (TID) | ORAL | 1 refills | Status: DC | PRN

## 2023-10-18 MED ORDER — BUPRENORPHINE HCL-NALOXONE HCL 8-2 MG SL FILM: 1.0000 | ORAL_FILM | Freq: Two times a day (BID) | SUBLINGUAL | 0 refills | Status: DC

## 2023-10-18 MED ORDER — POLYETHYLENE GLYCOL 3350 17 GM/SCOOP PO POWD: 17.0000 g | Freq: Two times a day (BID) | ORAL | 1 refills | Status: AC

## 2023-10-18 NOTE — Progress Notes (Unsigned)
 Subjective:  [name] Moreno is a 43 y.o. [phone] at [redacted]w[redacted]d being seen today for ongoing prenatal care.  She is currently monitored for the following issues for this high-risk pregnancy and has Multinodular goiter; Depression; History of gestational diabetes; Anemia affecting pregnancy; Nontoxic uninodular goiter; Anxiety; Chronic midline thoracic back pain; Chronic posttraumatic stress disorder; Supervision of high risk pregnancy, antepartum; Previous cesarean section complicating pregnancy; Chronic narcotic dependence (HCC); Benzodiazepine dependence (HCC); Substance dependence during pregnancy (HCC); Cervical high risk HPV (human papillomavirus) test positive; Alpha Thal carrier; Suboxone  maintenance treatment complicating pregnancy, antepartum, second trimester (HCC); Low-lying placenta; AMA (advanced maternal age) multigravida 35+; Preterm delivery; and Unwanted fertility on their problem list.  Patient reports swollen foot due to insect bite.   .  .   . Denies leaking of fluid.   The following portions of the patient's history were reviewed and updated as appropriate: allergies, current medications, past family history, past medical history, past social history, past surgical history and problem list. Problem list updated.  Virtual Visit via Video Note  I connected with [facility] on 10/18/23 at  3:15 PM EDT by a video enabled telemedicine application and verified that I am speaking with the correct person using two identifiers.  Location: Patient: Car Provider: Private office    I discussed the limitations of evaluation and management by telemedicine and the availability of in person appointments. The patient expressed understanding and agreed to proceed.   Objective:  There were no vitals filed for this visit.  Fetal Status:           General:  Alert, oriented and cooperative. Patient is in no acute distress.  Mental Status: Normal mood and affect. Normal behavior. Normal  judgment and thought content.   Urinalysis:      PDMP not reviewed this encounter.   Last UDS: Lab Results  Component Value Date   CREATIUR 159 10/05/2023      Assessment and Plan:  Pregnancy: [id number] at [redacted]w[redacted]d  1. Supervision of high risk pregnancy, antepartum (Primary)  2. Substance dependence during pregnancy (HCC) - Buprenorphine  HCl-Naloxone  HCl (SUBOXONE ) 8-2 MG FILM; Place 1 Film under the tongue 2 (two) times daily.  Dispense: 30 each; Refill: 0  3. Suboxone  maintenance treatment complicating pregnancy, antepartum, second trimester (HCC)  4. Previous cesarean section complicating pregnancy  5. Low-lying placenta  6. Chronic posttraumatic stress disorder  7. Cervical high risk HPV (human papillomavirus) test positive  8. Multigravida of advanced maternal age in third trimester  18. Alpha Thal carrier  10. Benzodiazepine dependence (HCC)  11. [redacted] weeks gestation of pregnancy  12. Nausea and vomiting during pregnancy - ondansetron  (ZOFRAN -ODT) 8 MG disintegrating tablet; Take 1 tablet (8 mg total) by mouth every 6 (six) hours as needed.  Dispense: 60 tablet; Refill: 3  13. Nausea/vomiting in pregnancy - ondansetron  (ZOFRAN -ODT) 8 MG disintegrating tablet; Take 1 tablet (8 mg total) by mouth every 6 (six) hours as needed.  Dispense: 60 tablet; Refill: 3  14. Back pain affecting pregnancy in third trimester - cyclobenzaprine  (FLEXERIL ) 5 MG tablet; Take 1-2 tablets (5-10 mg total) by mouth 3 (three) times daily as needed for muscle spasms. Use caution taking with other sedating medication.  Dispense: 30 tablet; Refill: 1  15. Constipation, unspecified constipation type - polyethylene glycol powder (GLYCOLAX /MIRALAX ) 17 GM/SCOOP powder; Take 17 g by mouth in the morning and at bedtime.  Dispense: 510 g; Refill: 1   Preterm labor symptoms and general obstetric precautions including but not limited  to vaginal bleeding, contractions, leaking of fluid and fetal  movement were reviewed in detail with the patient. Please refer to After Visit Summary for other counseling recommendations.   No follow-ups on file.   Future Appointments  Date Time Provider Department Center  10/19/2023  2:00 PM [facility] PROVIDER 1 [facility]  10/19/2023  2:15 PM [facility] NST [facility]  10/26/2023  2:00 PM [facility] PROVIDER 1 [facility]  10/26/2023  2:15 PM [facility] NST [facility]  11/02/2023  2:00 PM [facility] PROVIDER 1 [facility]  11/02/2023  2:30 PM [facility] US1 [facility]    Total face-to-face time with patient: 20 minutes.  Over 50% of encounter was spent on counseling and coordination of care.   Chelise Hanger  Claudene HOWARD 10/18/2023 3:42 PM [facility] [employer] [facility], [facility]

## 2023-10-19 ENCOUNTER — Ambulatory Visit

## 2023-10-26 ENCOUNTER — Ambulatory Visit: Attending: Maternal & Fetal Medicine | Admitting: *Deleted

## 2023-10-26 ENCOUNTER — Ambulatory Visit: Admitting: *Deleted

## 2023-10-26 VITALS — BP 119/65 | HR 83

## 2023-10-26 DIAGNOSIS — O09523 Supervision of elderly multigravida, third trimester: Secondary | ICD-10-CM | POA: Diagnosis not present

## 2023-10-26 DIAGNOSIS — O99320 Drug use complicating pregnancy, unspecified trimester: Secondary | ICD-10-CM | POA: Diagnosis not present

## 2023-10-26 DIAGNOSIS — F1120 Opioid dependence, uncomplicated: Secondary | ICD-10-CM | POA: Diagnosis not present

## 2023-10-26 DIAGNOSIS — O99013 Anemia complicating pregnancy, third trimester: Secondary | ICD-10-CM

## 2023-10-26 DIAGNOSIS — O0990 Supervision of high risk pregnancy, unspecified, unspecified trimester: Secondary | ICD-10-CM

## 2023-10-26 DIAGNOSIS — Z3A35 35 weeks gestation of pregnancy: Secondary | ICD-10-CM | POA: Insufficient documentation

## 2023-10-26 DIAGNOSIS — O34219 Maternal care for unspecified type scar from previous cesarean delivery: Secondary | ICD-10-CM

## 2023-10-26 NOTE — Procedures (Signed)
 [name] Moreno [birth year] [redacted]w[redacted]d  Fetus A Non-Stress Test Interpretation for 10/26/23  Indication: Advanced Maternal Age >40 years and Suboxone  Maintenance  Fetal Heart Rate A Mode: External Baseline Rate (A): 135 bpm Variability: Moderate Accelerations: 15 x 15 Decelerations: Variable (Variable decels noted @ start of NST, pt positioned to Rt side. No decels for next 20 minutes.) Multiple birth?: No  Uterine Activity Mode: Toco Contraction Frequency (min): None Resting Tone Palpated: Relaxed  Interpretation (Fetal Testing) Nonstress Test Interpretation: Reactive Comments: Tracing reviewed by Dr. Ileana

## 2023-10-29 ENCOUNTER — Inpatient Hospital Stay (HOSPITAL_COMMUNITY): Admitting: Anesthesiology

## 2023-10-29 ENCOUNTER — Other Ambulatory Visit: Payer: Self-pay

## 2023-10-29 ENCOUNTER — Encounter (HOSPITAL_COMMUNITY): Payer: Self-pay | Admitting: Obstetrics & Gynecology

## 2023-10-29 ENCOUNTER — Encounter: Payer: Self-pay | Admitting: Advanced Practice Midwife

## 2023-10-29 ENCOUNTER — Inpatient Hospital Stay (HOSPITAL_COMMUNITY)
Admission: AD | Admit: 2023-10-29 | Discharge: 2023-11-01 | DRG: 783 | Disposition: A | Discharge status: Home / Self Care | Attending: Obstetrics and Gynecology | Admitting: Obstetrics and Gynecology

## 2023-10-29 ENCOUNTER — Encounter (HOSPITAL_COMMUNITY)
Admission: AD | Disposition: A | Discharge status: Home / Self Care | Payer: Self-pay | Source: Home / Self Care | Attending: Obstetrics & Gynecology

## 2023-10-29 DIAGNOSIS — Z833 Family history of diabetes mellitus: Secondary | ICD-10-CM

## 2023-10-29 DIAGNOSIS — O4593 Premature separation of placenta, unspecified, third trimester: Secondary | ICD-10-CM | POA: Diagnosis present

## 2023-10-29 DIAGNOSIS — Z8249 Family history of ischemic heart disease and other diseases of the circulatory system: Secondary | ICD-10-CM

## 2023-10-29 DIAGNOSIS — O42919 Preterm premature rupture of membranes, unspecified as to length of time between rupture and onset of labor, unspecified trimester: Principal | ICD-10-CM

## 2023-10-29 DIAGNOSIS — Z302 Encounter for sterilization: Secondary | ICD-10-CM

## 2023-10-29 DIAGNOSIS — O34211 Maternal care for low transverse scar from previous cesarean delivery: Secondary | ICD-10-CM | POA: Diagnosis not present

## 2023-10-29 DIAGNOSIS — O4443 Low lying placenta NOS or without hemorrhage, third trimester: Secondary | ICD-10-CM | POA: Diagnosis not present

## 2023-10-29 DIAGNOSIS — O42913 Preterm premature rupture of membranes, unspecified as to length of time between rupture and onset of labor, third trimester: Secondary | ICD-10-CM

## 2023-10-29 DIAGNOSIS — Z98891 History of uterine scar from previous surgery: Secondary | ICD-10-CM

## 2023-10-29 DIAGNOSIS — O09523 Supervision of elderly multigravida, third trimester: Secondary | ICD-10-CM | POA: Diagnosis not present

## 2023-10-29 DIAGNOSIS — O9962 Diseases of the digestive system complicating childbirth: Secondary | ICD-10-CM | POA: Diagnosis present

## 2023-10-29 DIAGNOSIS — Z3A36 36 weeks gestation of pregnancy: Secondary | ICD-10-CM

## 2023-10-29 DIAGNOSIS — K219 Gastro-esophageal reflux disease without esophagitis: Secondary | ICD-10-CM | POA: Diagnosis present

## 2023-10-29 DIAGNOSIS — O26893 Other specified pregnancy related conditions, third trimester: Secondary | ICD-10-CM | POA: Diagnosis present

## 2023-10-29 DIAGNOSIS — Z87891 Personal history of nicotine dependence: Secondary | ICD-10-CM

## 2023-10-29 DIAGNOSIS — Z9079 Acquired absence of other genital organ(s): Secondary | ICD-10-CM

## 2023-10-29 DIAGNOSIS — F1120 Opioid dependence, uncomplicated: Secondary | ICD-10-CM | POA: Diagnosis present

## 2023-10-29 DIAGNOSIS — O99324 Drug use complicating childbirth: Secondary | ICD-10-CM | POA: Diagnosis present

## 2023-10-29 DIAGNOSIS — O99320 Drug use complicating pregnancy, unspecified trimester: Secondary | ICD-10-CM

## 2023-10-29 HISTORY — PX: TUBAL LIGATION: SHX77

## 2023-10-29 LAB — CBC WITH DIFFERENTIAL/PLATELET
Abs Immature Granulocytes: 0.05 K/uL (ref 0.00–0.07)
Basophils Absolute: 0 K/uL (ref 0.0–0.1)
Basophils Relative: 0 %
Eosinophils Absolute: 0 K/uL (ref 0.0–0.5)
Eosinophils Relative: 0 %
HCT: 32.3 % — ABNORMAL LOW (ref 36.0–46.0)
Hemoglobin: 10.3 g/dL — ABNORMAL LOW (ref 12.0–15.0)
Immature Granulocytes: 1 %
Lymphocytes Relative: 20 %
Lymphs Abs: 1.7 K/uL (ref 0.7–4.0)
MCH: 23.8 pg — ABNORMAL LOW (ref 26.0–34.0)
MCHC: 31.9 g/dL (ref 30.0–36.0)
MCV: 74.8 fL — ABNORMAL LOW (ref 80.0–100.0)
Monocytes Absolute: 0.7 K/uL (ref 0.1–1.0)
Monocytes Relative: 8 %
Neutro Abs: 6 K/uL (ref 1.7–7.7)
Neutrophils Relative %: 71 %
Platelets: 140 K/uL — ABNORMAL LOW (ref 150–400)
RBC: 4.32 MIL/uL (ref 3.87–5.11)
RDW: 15.2 % (ref 11.5–15.5)
WBC: 8.4 K/uL (ref 4.0–10.5)
nRBC: 0 % (ref 0.0–0.2)

## 2023-10-29 LAB — CBC
HCT: 26.4 % — ABNORMAL LOW (ref 36.0–46.0)
Hemoglobin: 8.5 g/dL — ABNORMAL LOW (ref 12.0–15.0)
MCH: 24 pg — ABNORMAL LOW (ref 26.0–34.0)
MCHC: 32.2 g/dL (ref 30.0–36.0)
MCV: 74.6 fL — ABNORMAL LOW (ref 80.0–100.0)
Platelets: 134 K/uL — ABNORMAL LOW (ref 150–400)
RBC: 3.54 MIL/uL — ABNORMAL LOW (ref 3.87–5.11)
RDW: 15.1 % (ref 11.5–15.5)
WBC: 14.6 K/uL — ABNORMAL HIGH (ref 4.0–10.5)
nRBC: 0 % (ref 0.0–0.2)

## 2023-10-29 LAB — COMPREHENSIVE METABOLIC PANEL WITH GFR
ALT: 13 U/L (ref 0–44)
AST: 20 U/L (ref 15–41)
Albumin: 2.8 g/dL — ABNORMAL LOW (ref 3.5–5.0)
Alkaline Phosphatase: 118 U/L (ref 38–126)
Anion gap: 11 (ref 5–15)
BUN: <5 mg/dL — ABNORMAL LOW (ref 6–20)
CO2: 19 mmol/L — ABNORMAL LOW (ref 22–32)
Calcium: 9.2 mg/dL (ref 8.9–10.3)
Chloride: 106 mmol/L (ref 98–111)
Creatinine, Ser: 0.73 mg/dL (ref 0.44–1.00)
GFR, Estimated: >60 mL/min (ref >60)
Glucose, Bld: 102 mg/dL — ABNORMAL HIGH (ref 70–99)
Potassium: 3.7 mmol/L (ref 3.5–5.1)
Sodium: 136 mmol/L (ref 135–145)
Total Bilirubin: 0.5 mg/dL (ref 0.0–1.2)
Total Protein: 6.3 g/dL — ABNORMAL LOW (ref 6.5–8.1)

## 2023-10-29 LAB — RUPTURE OF MEMBRANE (ROM)PLUS: Rom Plus: POSITIVE

## 2023-10-29 LAB — POCT FERN TEST: POCT Fern Test: NEGATIVE

## 2023-10-29 LAB — TYPE AND SCREEN
ABO/RH(D): A POS
Antibody Screen: NEGATIVE

## 2023-10-29 SURGERY — Surgical Case
Anesthesia: Spinal | Laterality: Bilateral

## 2023-10-29 MED ORDER — SOD CITRATE-CITRIC ACID 500-334 MG/5ML PO SOLN
30.0000 mL | ORAL | Status: AC
  Administered 2023-10-29: 30 mL via ORAL
  Filled 2023-10-29: qty 30

## 2023-10-29 MED ORDER — TRANEXAMIC ACID-NACL 1000-0.7 MG/100ML-% IV SOLN
INTRAVENOUS | Status: AC
  Filled 2023-10-29: qty 100

## 2023-10-29 MED ORDER — GABAPENTIN 300 MG PO CAPS
300.0000 mg | ORAL_CAPSULE | Freq: Three times a day (TID) | ORAL | Status: DC
  Administered 2023-10-30 – 2023-11-01 (×8): 300 mg via ORAL
  Filled 2023-10-29 (×2): qty 3
  Filled 2023-10-29 (×4): qty 1
  Filled 2023-10-29 (×2): qty 3
  Filled 2023-10-29: qty 1
  Filled 2023-10-29: qty 3
  Filled 2023-10-29 (×3): qty 1
  Filled 2023-10-29: qty 3

## 2023-10-29 MED ORDER — FENTANYL CITRATE (PF) 100 MCG/2ML IJ SOLN
INTRAMUSCULAR | Status: AC
  Filled 2023-10-29: qty 2

## 2023-10-29 MED ORDER — IBUPROFEN 600 MG PO TABS
600.0000 mg | ORAL_TABLET | Freq: Four times a day (QID) | ORAL | Status: DC
  Administered 2023-10-30 – 2023-11-01 (×6): 600 mg via ORAL
  Filled 2023-10-29 (×6): qty 1

## 2023-10-29 MED ORDER — CEFAZOLIN SODIUM-DEXTROSE 2-4 GM/100ML-% IV SOLN
2.0000 g | INTRAVENOUS | Status: AC
  Administered 2023-10-29: 2 g via INTRAVENOUS

## 2023-10-29 MED ORDER — DEXMEDETOMIDINE HCL IN NACL 400 MCG/100ML IV SOLN
INTRAVENOUS | Status: DC | PRN
  Administered 2023-10-29: 8 ug via INTRAVENOUS
  Administered 2023-10-29 (×4): 4 ug via INTRAVENOUS

## 2023-10-29 MED ORDER — NALOXONE HCL 4 MG/10ML IJ SOLN: 1.0000 ug/kg/h | INTRAVENOUS | Status: DC | PRN

## 2023-10-29 MED ORDER — ONDANSETRON HCL 4 MG/2ML IJ SOLN: 4.0000 mg | Freq: Three times a day (TID) | INTRAMUSCULAR | Status: DC | PRN

## 2023-10-29 MED ORDER — KETOROLAC TROMETHAMINE 30 MG/ML IJ SOLN
30.0000 mg | Freq: Four times a day (QID) | INTRAMUSCULAR | Status: AC
  Administered 2023-10-30 (×3): 30 mg via INTRAVENOUS
  Filled 2023-10-29 (×3): qty 1

## 2023-10-29 MED ORDER — SODIUM CHLORIDE 0.9% FLUSH: 3.0000 mL | INTRAVENOUS | Status: DC | PRN

## 2023-10-29 MED ORDER — LACTATED RINGERS IV BOLUS
1000.0000 mL | Freq: Once | INTRAVENOUS | Status: AC
  Administered 2023-10-29: 1000 mL via INTRAVENOUS

## 2023-10-29 MED ORDER — SIMETHICONE 80 MG PO CHEW
80.0000 mg | CHEWABLE_TABLET | Freq: Three times a day (TID) | ORAL | Status: DC
  Administered 2023-10-30 – 2023-11-01 (×7): 80 mg via ORAL
  Filled 2023-10-29 (×7): qty 1

## 2023-10-29 MED ORDER — PHENYLEPHRINE HCL-NACL 20-0.9 MG/250ML-% IV SOLN
INTRAVENOUS | Status: DC | PRN
  Administered 2023-10-29: 60 ug/min via INTRAVENOUS

## 2023-10-29 MED ORDER — LACTATED RINGERS IV SOLN: INTRAVENOUS | Status: DC

## 2023-10-29 MED ORDER — OXYTOCIN-SODIUM CHLORIDE 30-0.9 UT/500ML-% IV SOLN
INTRAVENOUS | Status: DC | PRN
  Administered 2023-10-29: 30 [IU] via INTRAVENOUS

## 2023-10-29 MED ORDER — OXYCODONE HCL 5 MG PO TABS: 5.0000 mg | ORAL_TABLET | Freq: Once | ORAL | Status: DC | PRN

## 2023-10-29 MED ORDER — ONDANSETRON HCL 4 MG/2ML IJ SOLN
INTRAMUSCULAR | Status: AC
  Filled 2023-10-29: qty 2

## 2023-10-29 MED ORDER — FENTANYL CITRATE (PF) 100 MCG/2ML IJ SOLN
INTRAMUSCULAR | Status: DC | PRN
  Administered 2023-10-29: 85 ug via EPIDURAL

## 2023-10-29 MED ORDER — DIBUCAINE (PERIANAL) 1 % EX OINT
1.0000 | TOPICAL_OINTMENT | CUTANEOUS | Status: DC | PRN
Start: 2023-10-29 — End: 2023-11-01

## 2023-10-29 MED ORDER — NALOXONE HCL 0.4 MG/ML IJ SOLN: 0.4000 mg | INTRAMUSCULAR | Status: DC | PRN

## 2023-10-29 MED ORDER — ACETAMINOPHEN 500 MG PO TABS
1000.0000 mg | ORAL_TABLET | Freq: Four times a day (QID) | ORAL | Status: DC
  Filled 2023-10-29: qty 2

## 2023-10-29 MED ORDER — SODIUM CHLORIDE 0.9 % IV SOLN
500.0000 mg | INTRAVENOUS | Status: AC
  Administered 2023-10-29: 500 mg via INTRAVENOUS
  Filled 2023-10-29: qty 5

## 2023-10-29 MED ORDER — LIDOCAINE-EPINEPHRINE (PF) 2 %-1:200000 IJ SOLN
INTRAMUSCULAR | Status: DC | PRN
  Administered 2023-10-29: 20 mL via EPIDURAL

## 2023-10-29 MED ORDER — SODIUM CHLORIDE 0.9% FLUSH
3.0000 mL | Freq: Two times a day (BID) | INTRAVENOUS | Status: DC
  Administered 2023-10-30: 10 mL via INTRAVENOUS
  Administered 2023-10-30: 8 mL via INTRAVENOUS
  Administered 2023-10-30 – 2023-10-31 (×2): 3 mL via INTRAVENOUS

## 2023-10-29 MED ORDER — ACETAMINOPHEN 500 MG PO TABS
1000.0000 mg | ORAL_TABLET | Freq: Four times a day (QID) | ORAL | Status: DC
  Administered 2023-10-30 – 2023-11-01 (×9): 1000 mg via ORAL
  Filled 2023-10-29 (×9): qty 2

## 2023-10-29 MED ORDER — TRANEXAMIC ACID-NACL 1000-0.7 MG/100ML-% IV SOLN
1000.0000 mg | Freq: Once | INTRAVENOUS | Status: AC
  Administered 2023-10-29: 1000 mg via INTRAVENOUS

## 2023-10-29 MED ORDER — SIMETHICONE 80 MG PO CHEW
80.0000 mg | CHEWABLE_TABLET | ORAL | Status: DC | PRN
  Administered 2023-10-31: 80 mg via ORAL
  Filled 2023-10-29: qty 1

## 2023-10-29 MED ORDER — OXYTOCIN-SODIUM CHLORIDE 30-0.9 UT/500ML-% IV SOLN
2.5000 [IU]/h | INTRAVENOUS | Status: AC
  Administered 2023-10-29: 2.5 [IU]/h via INTRAVENOUS

## 2023-10-29 MED ORDER — OXYCODONE HCL 5 MG PO TABS
5.0000 mg | ORAL_TABLET | Freq: Four times a day (QID) | ORAL | Status: DC | PRN
  Administered 2023-10-30 – 2023-11-01 (×6): 10 mg via ORAL
  Filled 2023-10-29 (×5): qty 2
  Filled 2023-10-29: qty 1
  Filled 2023-10-29: qty 2

## 2023-10-29 MED ORDER — ENOXAPARIN SODIUM 60 MG/0.6ML IJ SOSY
50.0000 mg | PREFILLED_SYRINGE | INTRAMUSCULAR | Status: DC
  Administered 2023-10-30 – 2023-10-31 (×2): 50 mg via SUBCUTANEOUS
  Filled 2023-10-29 (×2): qty 0.6

## 2023-10-29 MED ORDER — DEXMEDETOMIDINE HCL IN NACL 80 MCG/20ML IV SOLN
INTRAVENOUS | Status: AC
Start: 2023-10-29 — End: 2023-10-29
  Filled 2023-10-29: qty 20

## 2023-10-29 MED ORDER — KETOROLAC TROMETHAMINE 30 MG/ML IJ SOLN
30.0000 mg | Freq: Four times a day (QID) | INTRAMUSCULAR | Status: AC | PRN
Start: 2023-10-29 — End: 2023-10-30

## 2023-10-29 MED ORDER — MAGNESIUM HYDROXIDE 400 MG/5ML PO SUSP: 30.0000 mL | ORAL | Status: DC | PRN

## 2023-10-29 MED ORDER — KETOROLAC TROMETHAMINE 30 MG/ML IJ SOLN
30.0000 mg | Freq: Once | INTRAMUSCULAR | Status: AC | PRN
  Administered 2023-10-29: 30 mg via INTRAVENOUS

## 2023-10-29 MED ORDER — SODIUM CHLORIDE 0.9 % IV SOLN: INTRAVENOUS | Status: DC | PRN

## 2023-10-29 MED ORDER — MENTHOL 3 MG MT LOZG: 1.0000 | LOZENGE | OROMUCOSAL | Status: DC | PRN

## 2023-10-29 MED ORDER — KETOROLAC TROMETHAMINE 30 MG/ML IJ SOLN: 30.0000 mg | Freq: Four times a day (QID) | INTRAMUSCULAR | Status: AC | PRN

## 2023-10-29 MED ORDER — ACETAMINOPHEN 10 MG/ML IV SOLN
INTRAVENOUS | Status: DC | PRN
Start: 2023-10-29 — End: 2023-10-29
  Administered 2023-10-29: 1000 mg via INTRAVENOUS

## 2023-10-29 MED ORDER — OXYCODONE HCL 5 MG PO TABS: 5.0000 mg | ORAL_TABLET | ORAL | Status: AC | PRN

## 2023-10-29 MED ORDER — FENTANYL CITRATE (PF) 100 MCG/2ML IJ SOLN
25.0000 ug | INTRAMUSCULAR | Status: DC | PRN
  Administered 2023-10-29: 50 ug via INTRAVENOUS
  Administered 2023-10-29 (×2): 25 ug via INTRAVENOUS
  Administered 2023-10-29: 50 ug via INTRAVENOUS

## 2023-10-29 MED ORDER — FENTANYL CITRATE (PF) 100 MCG/2ML IJ SOLN
50.0000 ug | INTRAMUSCULAR | Status: AC | PRN
  Administered 2023-10-30 (×2): 50 ug via INTRAVENOUS
  Filled 2023-10-29: qty 2

## 2023-10-29 MED ORDER — DIPHENHYDRAMINE HCL 25 MG PO CAPS
25.0000 mg | ORAL_CAPSULE | Freq: Four times a day (QID) | ORAL | Status: DC | PRN
Start: 2023-10-29 — End: 2023-11-01

## 2023-10-29 MED ORDER — TRANEXAMIC ACID-NACL 1000-0.7 MG/100ML-% IV SOLN: 1000.0000 mg | Freq: Once | INTRAVENOUS | Status: DC

## 2023-10-29 MED ORDER — CEFAZOLIN SODIUM-DEXTROSE 2-4 GM/100ML-% IV SOLN
2.0000 g | INTRAVENOUS | Status: DC
  Filled 2023-10-29: qty 100

## 2023-10-29 MED ORDER — COCONUT OIL OIL: 1.0000 | TOPICAL_OIL | Status: DC | PRN

## 2023-10-29 MED ORDER — ACETAMINOPHEN 10 MG/ML IV SOLN
INTRAVENOUS | Status: AC
  Filled 2023-10-29: qty 100

## 2023-10-29 MED ORDER — FENTANYL CITRATE (PF) 100 MCG/2ML IJ SOLN
INTRAMUSCULAR | Status: DC | PRN
  Administered 2023-10-29 (×2): 50 ug via INTRAVENOUS

## 2023-10-29 MED ORDER — LACTATED RINGERS IV SOLN: INTRAVENOUS | Status: DC | PRN

## 2023-10-29 MED ORDER — KETOROLAC TROMETHAMINE 30 MG/ML IJ SOLN
INTRAMUSCULAR | Status: AC
Start: 2023-10-29 — End: 2023-10-29
  Filled 2023-10-29: qty 1

## 2023-10-29 MED ORDER — DIPHENHYDRAMINE HCL 25 MG PO CAPS: 25.0000 mg | ORAL_CAPSULE | ORAL | Status: DC | PRN

## 2023-10-29 MED ORDER — DEXAMETHASONE SODIUM PHOSPHATE 10 MG/ML IJ SOLN
INTRAMUSCULAR | Status: DC | PRN
  Administered 2023-10-29: 10 mg via INTRAVENOUS

## 2023-10-29 MED ORDER — POVIDONE-IODINE 10 % EX SWAB: 2.0000 | Freq: Once | CUTANEOUS | Status: DC

## 2023-10-29 MED ORDER — OXYTOCIN-SODIUM CHLORIDE 30-0.9 UT/500ML-% IV SOLN
INTRAVENOUS | Status: AC
  Filled 2023-10-29: qty 500

## 2023-10-29 MED ORDER — PRENATAL MULTIVITAMIN CH
1.0000 | ORAL_TABLET | Freq: Every day | ORAL | Status: DC
  Administered 2023-10-30 – 2023-10-31 (×2): 1 via ORAL
  Filled 2023-10-29 (×2): qty 1

## 2023-10-29 MED ORDER — WITCH HAZEL-GLYCERIN EX PADS: 1.0000 | MEDICATED_PAD | CUTANEOUS | Status: DC | PRN

## 2023-10-29 MED ORDER — FENTANYL CITRATE (PF) 100 MCG/2ML IJ SOLN
INTRAMUSCULAR | Status: AC
Start: 2023-10-29 — End: 2023-10-29
  Filled 2023-10-29: qty 2

## 2023-10-29 MED ORDER — MORPHINE SULFATE (PF) 0.5 MG/ML IJ SOLN
INTRAMUSCULAR | Status: AC
  Filled 2023-10-29: qty 10

## 2023-10-29 MED ORDER — SCOPOLAMINE 1 MG/3DAYS TD PT72
1.0000 | MEDICATED_PATCH | Freq: Once | TRANSDERMAL | Status: DC
  Administered 2023-10-29: 1.5 mg via TRANSDERMAL

## 2023-10-29 MED ORDER — MORPHINE SULFATE (PF) 0.5 MG/ML IJ SOLN
INTRAMUSCULAR | Status: DC | PRN
  Administered 2023-10-29: 3 mg via EPIDURAL

## 2023-10-29 MED ORDER — TETANUS-DIPHTH-ACELL PERTUSSIS 5-2.5-18.5 LF-MCG/0.5 IM SUSY: 0.5000 mL | PREFILLED_SYRINGE | Freq: Once | INTRAMUSCULAR | Status: DC

## 2023-10-29 MED ORDER — SENNOSIDES-DOCUSATE SODIUM 8.6-50 MG PO TABS
2.0000 | ORAL_TABLET | Freq: Every day | ORAL | Status: DC
  Administered 2023-10-30 – 2023-11-01 (×3): 2 via ORAL
  Filled 2023-10-29 (×3): qty 2

## 2023-10-29 MED ORDER — SOD CITRATE-CITRIC ACID 500-334 MG/5ML PO SOLN: 30.0000 mL | ORAL | Status: DC

## 2023-10-29 MED ORDER — SCOPOLAMINE 1 MG/3DAYS TD PT72
MEDICATED_PATCH | TRANSDERMAL | Status: AC
  Filled 2023-10-29: qty 1

## 2023-10-29 MED ORDER — SODIUM CHLORIDE 0.9 % IV SOLN
INTRAVENOUS | Status: AC
  Filled 2023-10-29: qty 5

## 2023-10-29 MED ORDER — OXYCODONE HCL 5 MG/5ML PO SOLN: 5.0000 mg | Freq: Once | ORAL | Status: DC | PRN

## 2023-10-29 MED ORDER — DIPHENHYDRAMINE HCL 50 MG/ML IJ SOLN: 12.5000 mg | INTRAMUSCULAR | Status: DC | PRN

## 2023-10-29 MED ORDER — DEXAMETHASONE SODIUM PHOSPHATE 10 MG/ML IJ SOLN
INTRAMUSCULAR | Status: AC
  Filled 2023-10-29: qty 1

## 2023-10-29 SURGICAL SUPPLY — 30 items
CHLORAPREP W/TINT 26 (MISCELLANEOUS) ×2 IMPLANT
CLAMP UMBILICAL CORD (MISCELLANEOUS) ×2 IMPLANT
CLOTH BEACON ORANGE TIMEOUT ST (SAFETY) ×2 IMPLANT
DERMABOND ADVANCED .7 DNX12 (GAUZE/BANDAGES/DRESSINGS) ×4 IMPLANT
DRSG OPSITE POSTOP 4X10 (GAUZE/BANDAGES/DRESSINGS) ×2 IMPLANT
ELECTRODE REM PT RTRN 9FT ADLT (ELECTROSURGICAL) ×2 IMPLANT
EXTRACTOR VACUUM BELL STYLE (SUCTIONS) IMPLANT
GLOVE BIOGEL PI IND STRL 7.0 (GLOVE) ×2 IMPLANT
GLOVE BIOGEL PI IND STRL 8 (GLOVE) ×2 IMPLANT
GLOVE ECLIPSE 8.0 STRL XLNG CF (GLOVE) ×2 IMPLANT
GOWN STRL REUS W/TWL LRG LVL3 (GOWN DISPOSABLE) ×4 IMPLANT
KIT ABG SYR 3ML LUER SLIP (SYRINGE) ×2 IMPLANT
MAT PREVALON FULL STRYKER (MISCELLANEOUS) IMPLANT
NDL HYPO 25X5/8 SAFETYGLIDE (NEEDLE) ×2 IMPLANT
NEEDLE HYPO 25X5/8 SAFETYGLIDE (NEEDLE) ×1 IMPLANT
NS IRRIG 1000ML POUR BTL (IV SOLUTION) ×2 IMPLANT
PACK C SECTION WH (CUSTOM PROCEDURE TRAY) ×2 IMPLANT
PAD OB MATERNITY 4.3X12.25 (PERSONAL CARE ITEMS) ×2 IMPLANT
RTRCTR C-SECT PINK 25CM LRG (MISCELLANEOUS) IMPLANT
SUT CHROMIC 0 CT 1 (SUTURE) ×2 IMPLANT
SUT MNCRL 0 VIOLET CTX 36 (SUTURE) ×4 IMPLANT
SUT PLAIN 2 0 XLH (SUTURE) IMPLANT
SUT PLAIN ABS 2-0 CT1 27XMFL (SUTURE) IMPLANT
SUT VIC AB 0 CTX36XBRD ANBCTRL (SUTURE) ×2 IMPLANT
SUT VIC AB 2-0 SH 27XBRD (SUTURE) IMPLANT
SUT VIC AB 4-0 KS 27 (SUTURE) IMPLANT
SYR 20CC LL (SYRINGE) ×4 IMPLANT
TOWEL OR 17X24 6PK STRL BLUE (TOWEL DISPOSABLE) ×2 IMPLANT
TRAY FOLEY W/BAG SLVR 14FR LF (SET/KITS/TRAYS/PACK) IMPLANT
WATER STERILE IRR 1000ML POUR (IV SOLUTION) ×2 IMPLANT

## 2023-10-29 NOTE — H&P (Addendum)
 Obstetric Preoperative History and Physical  [name] Moreno is a 43 y.o. [id number] with IUP at [redacted]w[redacted]d presenting for contractions and leaking fluid.   Prenatal Course Source of Care: [facility] with onset of care at 13 weeks Pregnancy complications or risks: Patient Active Problem List   Diagnosis Date Noted   Unwanted fertility 10/05/2023   Preterm delivery 10/04/2023   Low-lying placenta 07/12/2023   AMA (advanced maternal age) multigravida 35+ 07/12/2023   Alpha Thal carrier 07/04/2023   Suboxone  maintenance treatment complicating pregnancy, antepartum, second trimester (HCC) 07/04/2023   Cervical high risk HPV (human papillomavirus) test positive 06/13/2023   Supervision of high risk pregnancy, antepartum 05/19/2023   Previous cesarean section complicating pregnancy 05/19/2023   Chronic narcotic dependence (HCC) 05/19/2023   Benzodiazepine dependence (HCC) 05/19/2023   Substance dependence during pregnancy (HCC) 05/19/2023   Chronic midline thoracic back pain 06/12/2019   Chronic posttraumatic stress disorder 06/12/2019   Anxiety 02/06/2018   Anemia affecting pregnancy 01/10/2018   History of gestational diabetes 12/03/2017   Nontoxic uninodular goiter 11/13/2013   Depression    Multinodular goiter 01/04/2012   She plans to bottle feed She desires bilateral tubal ligation for postpartum contraception.   Prenatal labs and studies: ABO, Rh: --/--/PENDING (07/12 1843) Antibody: PENDING (07/12 1843) Rubella: 15.60 (02/18 1545) RPR: Non Reactive (06/18 1518)  HBsAg: Negative (02/18 1545)  HIV: Non Reactive (06/18 1518)  GBS:  1 hr Glucola  normal Genetic screening normal Anatomy US  normal  Prenatal Transfer Tool  Fetal Ultrasounds or other Referrals:  Referred to Materal Fetal Medicine  Maternal Substance Abuse:  Yes:  Type: Other: suboxone  Significant Maternal Medications:  None Significant Maternal Lab Results: None  Past Medical History:  Diagnosis Date   Benign  cyst of right breast 10/13/2017   bx by radiology showed lactating adenoma     Depression    Gastroesophageal reflux disease with esophagitis without hemorrhage 07/12/2019   Last Assessment & Plan:   Formatting of this note might be different from the original.  Suspect this was made worse due to prednisone taper.  Will start PPI at 40 mg twice daily for 2 weeks then 40 mg daily.  Consider referral to GI for no improvement or worsening signs and symptoms.     We did discuss dietary triggers and recommendations for controlling GERD     Headache    Heart murmur    Multinodular goiter    Thyroid  nodule     Past Surgical History:  Procedure Laterality Date   CESAREAN SECTION N/A 01/24/2018   Procedure: CESAREAN SECTION;  Surgeon: Nicholaus Burnard HERO, MD;  Location: [facility] BIRTHING SUITES;  Service: Obstetrics;  Laterality: N/A;    OB History  Gravida Para Term Preterm AB Living  4 2 0 2 1 2   SAB IAB Ectopic Multiple Live Births  1 0 0 0 2    # Outcome Date GA Lbr Len/2nd Weight Sex Type Anes PTL Lv  4 Current           3 Preterm 01/24/18 [redacted]w[redacted]d  2460 g F CS-LTranv EPI  LIV  2 SAB 04/19/17     SAB     1 Preterm 05/29/03 [redacted]w[redacted]d    Vag-Spont   LIV     Birth Comments: Had placental separation leading to preterm delivery     Complications: Abruptio Placenta    Social History   Socioeconomic History   Marital status: Single    Spouse name: Not on file  Number of children: 2   Years of education: 12   Highest education level: Not on file  Occupational History   Occupation: CNA  Tobacco Use   Smoking status: Former    Current packs/day: 0.30    Average packs/day: 0.3 packs/day for 6.3 years (1.9 ttl pk-yrs)    Types: Cigarettes    Start date: 07/01/2017   Smokeless tobacco: Never  Vaping Use   Vaping status: Never Used  Substance and Sexual Activity   Alcohol use: Not Currently    Comment: occas. when not preg   Drug use: Not Currently   Sexual activity: Yes    Partners: Male     Birth control/protection: None  Other Topics Concern   Not on file  Social History Narrative   Not on file   Social Drivers of Health   Financial Resource Strain: Low Risk  (06/12/2019)   Received from [facility] the [employer] Strain (CARDIA)    Difficulty of Paying Living Expenses: Not hard at all  Food Insecurity: No Food Insecurity (06/12/2019)   Received from [facility] the [facility]   Hunger Vital Sign    Within the past 12 months, you worried that your food would run out before you got the money to buy more.: Never true    Within the past 12 months, the food you bought just didn't last and you didn't have money to get more.: Never true  Transportation Needs: No Transportation Needs (06/12/2019)   Received from [facility] the [employer] - Transportation    Lack of Transportation (Medical): No    Lack of Transportation (Non-Medical): No  Physical Activity: Sufficiently Active (06/12/2019)   Received from [facility] the [facility]   Exercise Vital Sign    On average, how many days per week do you engage in moderate to strenuous exercise (like a brisk walk)?: 7 days    On average, how many minutes do you engage in exercise at this level?: 30 min  Stress: No Stress Concern Present (06/12/2019)   Received from [facility] the [employer] of Occupational Health - Occupational Stress Questionnaire    Feeling of Stress : Not at all  Social Connections: Socially Integrated (06/12/2019)   Received from [facility] the [employer] and Isolation Panel    In a typical week, how many times do you talk on the phone with family, friends, or neighbors?: More than three times a week    How often do you get together with friends or relatives?: Once a week    How often do you attend church or religious services?: More than 4 times per year    Do you belong to any clubs or organizations such as church  groups, unions, fraternal or athletic groups, or school groups?: Yes    How often do you attend meetings of the clubs or organizations you belong to?: More than 4 times per year    Are you married, widowed, divorced, separated, never married, or living with a partner?: Living with partner    Family History  Problem Relation Age of Onset   Hypertension Other        Grandparents   Diabetes Other        Grandparents    Medications Prior to Admission  Medication Sig Dispense Refill Last Dose/Taking   Buprenorphine  HCl-Naloxone  HCl (SUBOXONE ) 8-2 MG FILM Place 1 Film under the tongue 2 (two) times daily.  30 each 0 Past Week   cyclobenzaprine  (FLEXERIL ) 5 MG tablet Take 1-2 tablets (5-10 mg total) by mouth 3 (three) times daily as needed for muscle spasms. Use caution taking with other sedating medication. 30 tablet 1 Past Week   Doxylamine -Pyridoxine  (DICLEGIS ) 10-10 MG TBEC Start with 2 tablets by mouth every evening. If symptoms persist, add 1 tablet every morning. If symptoms persist, add 1 tablet at mid-day. 60 tablet 3 10/29/2023   ondansetron  (ZOFRAN -ODT) 8 MG disintegrating tablet Take 1 tablet (8 mg total) by mouth every 6 (six) hours as needed. 60 tablet 3 10/28/2023   pantoprazole  (PROTONIX ) 40 MG tablet Take 1 tablet (40 mg total) by mouth daily. 90 tablet 3 10/29/2023   polyethylene glycol powder (GLYCOLAX /MIRALAX ) 17 GM/SCOOP powder Take 17 g by mouth in the morning and at bedtime. 510 g 1 Past Week   Prenatal Vit-Fe Fumarate-FA (MULTIVITAMIN-PRENATAL) 27-0.8 MG TABS tablet Take 1 tablet by mouth daily at 12 noon.   10/29/2023   promethazine  (PHENERGAN ) 25 MG tablet Take 1 tablet (25 mg total) by mouth every 6 (six) hours as needed for nausea or vomiting. 30 tablet 1 10/28/2023   hydrOXYzine  (ATARAX ) 25 MG tablet Take 1 tablet (25 mg total) by mouth every 6 (six) hours as needed for itching. 30 tablet 2 More than a month    No Known Allergies  Review of Systems: Negative except for  what is mentioned in HPI.  Physical Exam: BP (!) 143/83   Pulse (!) 105   Temp 98.5 F (36.9 C)   Resp 17   LMP 02/19/2023   SpO2 100%  FHR by Doppler: 140s bpm CONSTITUTIONAL: Well-developed, well-nourished female in no acute distress.  HENT:  Normocephalic, atraumatic, External right and left ear normal. Oropharynx is clear and moist EYES: Conjunctivae and EOM are normal. Pupils are equal, round, and reactive to light. No scleral icterus.  NECK: Normal range of motion, supple, no masses SKIN: Skin is warm and dry. No rash noted. Not diaphoretic. No erythema. No pallor. NEUROLGIC: Alert and oriented to person, place, and time. Normal reflexes, muscle tone coordination. No cranial nerve deficit noted. PSYCHIATRIC: Normal mood and affect. Normal behavior. Normal judgment and thought content. CARDIOVASCULAR: Normal heart rate noted, regular rhythm RESPIRATORY: Effort and breath sounds normal, no problems with respiration noted ABDOMEN: Soft, nontender, nondistended, gravid. Well-healed Pfannenstiel incision. PELVIC: Deferred MUSCULOSKELETAL: Normal range of motion. No edema and no tenderness.    Pertinent Labs/Studies:   Results for orders placed or performed during the hospital encounter of 10/29/23 (from the past 72 hours)  Rupture of Membrane (ROM) Plus     Status: None   Collection Time: 10/29/23  6:25 PM  Result Value Ref Range   Rom Plus POSITIVE     Comment: Performed at [facility], [address], [place], [place] [address]  Type and screen [facility]     Status: None (Preliminary result)   Collection Time: 10/29/23  6:43 PM  Result Value Ref Range   ABO/RH(D) PENDING    Antibody Screen PENDING    Sample Expiration      11/01/2023,2359 Performed at [facility], [address] [place], [place] [address]   Odetta Test     Status: Normal   Collection Time: 10/29/23  6:54 PM  Result Value Ref Range   POCT Fern Test Negative = intact  amniotic membranes     Assessment and Plan :Ciarra Braddy is a 43 y.o. [id number]  at [redacted]w[redacted]d being admitted for unscheduled repeat cesarean section for labor. Also wants BTL.   ROMplus positive, patient also with PROM. Offered to have patient opt for TOLAC given she is a candidate and she declines.  The risks of cesarean section were discussed with the patient; including but not limited to: infection which may require antibiotics; bleeding which may require transfusion or re-operation; injury to bowel, bladder, ureters or other surrounding organs; need for additional procedures including hysterectomy in the event of a life-threatening hemorrhage; placental abnormalities wth subsequent pregnancies,  risk of needing c-sections in future pregnancies, incisional problems, thromboembolic phenomenon and other postoperative/anesthesia complications. Reviewed risks of bilateral tubal ligation including infection, hemorrhage, damage to surrounding tissue and organs, risk of regret. Reviewed bilateral tubal ligation failure rate of ~1%, and that there is a slightly higher risk of ectopic after tubal ligation; if she has any reason to believe she is pregnant, she should take a pregnancy test. She understands this is an elective procedure and again affirms her desire. Answered all questions. The patient verbalized understanding of the plan, giving informed consent for the procedure. She is agreeable to blood transfusion in the event of emergency.  Patient has been NPO since 5 pm, she will remain NPO for procedure Anesthesia and OR aware Preoperative prophylactic antibiotics and SCDs ordered on call to the OR  To OR when ready   [name] Moreno, M.D. Attending Obstetrician & Gynecologist, [facility] for [employer], [facility]

## 2023-10-29 NOTE — Anesthesia Procedure Notes (Signed)
 Epidural Patient location during procedure: OB Start time: 10/29/2023 7:47 PM End time: 10/29/2023 8:01 PM  Staffing Anesthesiologist: Niels Marien CROME, MD Performed: anesthesiologist   Preanesthetic Checklist Completed: patient identified, IV checked, risks and benefits discussed, monitors and equipment checked, pre-op evaluation and timeout performed  Epidural Patient position: sitting Prep: DuraPrep and site prepped and draped Patient monitoring: continuous pulse ox, blood pressure, heart rate and cardiac monitor Approach: midline Location: L4-L5 Injection technique: LOR air  Needle:  Needle type: Tuohy  Needle gauge: 17 G Needle length: 9 cm Needle insertion depth: 7 cm Catheter type: closed end flexible Catheter size: 19 Gauge Catheter at skin depth: 12 cm Test dose: negative  Assessment Sensory level: T8 Events: blood not aspirated, no cerebrospinal fluid, injection not painful, no injection resistance, no paresthesia and negative IV test  Additional Notes Patient identified. Risks/Benefits/Options discussed with patient including but not limited to bleeding, infection, nerve damage, paralysis, failed block, incomplete pain control, headache, blood pressure changes, nausea, vomiting, reactions to medication both or allergic, itching and postpartum back pain. Confirmed with bedside nurse the patient's most recent platelet count. Confirmed with patient that they are not currently taking any anticoagulation, have any bleeding history or any family history of bleeding disorders. Patient expressed understanding and wished to proceed. All questions were answered. Sterile technique was used throughout the entire procedure. Please see nursing notes for vital signs. Test dose was given through epidural catheter and negative prior to continuing to dose epidural or start infusion. Warning signs of high block given to the patient including shortness of breath, tingling/numbness in hands,  complete motor block, or any concerning symptoms with instructions to call for help. Patient was given instructions on fall risk and not to get out of bed. All questions and concerns addressed with instructions to call with any issues or inadequate analgesia.    First attempt at spinal anesthesia at L4/5 with introducer and 24G Pencan. Pt reported right sided paresthesias. Needle retracted and adjusted. Needle advanced into intrathecal space with return of CSF; however, unable to aspirate. Pt again reported right sided parasthesia. Second attempt at L3/4 with introducer and 24G Pencan. Pt reported right sided paresthesias. Unable to obtain CSF return. Spinal was aborted. Third attempt at L4/5 and Tuohy. Able to find epidural space without difficulty. LOR at 7cm. Epidural catheter inserted into epidural space and left at 12cm at skin. No paresthesias. Epidural dosed as per chart.   During spinal attempts, 15mcg fentanyl  and 150mcg morphine  were wasted into spinal tray. Reason for block:procedure for pain

## 2023-10-29 NOTE — MAU Note (Signed)
.  [name] Moreno is a 43 y.o. at [redacted]w[redacted]d here in MAU reporting: Patient reports at 0900 she woke up and the bed was soaked. She thought is water and then when she stood up it was blood coming down her legs.   Ctx's started after her nap at 1500. She reports that they are not regular   Onset of complaint:  Pain score: 8/10 There were no vitals filed for this visit.    Lab orders placed from triage:   none

## 2023-10-29 NOTE — Op Note (Cosign Needed)
 [facility]  PROCEDURE DATE: 10/29/2023  PREOPERATIVE DIAGNOSES: Intrauterine pregnancy at [redacted]w[redacted]d weeks gestation; suspected placental abruption, preterm premature rupture of membranes, unwanted fertility  POSTOPERATIVE DIAGNOSES: The same, s/p bilateral salpingectomy  PROCEDURE: Repeat Low Transverse Cesarean Section and Bilateral Salpingectomy  SURGEON:  LOIS Yolanda Moats, MD  ASSISTANT: Alain Sor, MD  An experienced assistant was required given the standard of surgical care given the complexity of the case.  This assistant was needed for exposure, dissection, suctioning, retraction, instrument exchange, assisting with delivery with administration of fundal pressure, and for overall help during the procedure.  ANESTHESIOLOGY TEAM: Anesthesiologist: Niels Marien CROME, MD CRNA: Harvey Erna HERO, CRNA  INDICATIONS: [name] Moreno is a 43 y.o. [phone] at [redacted]w[redacted]d here for cesarean section secondary to the indications listed under preoperative diagnoses; please see preoperative note for further details.  The risks of cesarean section were discussed with the patient including but were not limited to: bleeding which may require transfusion or reoperation; infection which may require antibiotics; injury to bowel, bladder, ureters or other surrounding organs; injury to the fetus; need for additional procedures including hysterectomy in the event of a life-threatening hemorrhage; placental abnormalities wth subsequent pregnancies, incisional problems, thromboembolic phenomenon and other postoperative/anesthesia complications.  She consents to blood transfusion in the event of an emergency. The patient verbalized understanding of the plan, giving informed written consent for the procedure.    FINDINGS:  Viable female infant in cephalic presentation.  Apgars 7, 8, and 9. Weight: 2900g. Bloody fluid. Intact placenta, three vessel cord.  Normal uterus, fallopian tubes and ovaries  bilaterally.  ANESTHESIA: Epidural INTRAVENOUS FLUIDS: 2300 ml   ESTIMATED BLOOD LOSS: 1068 ml URINE OUTPUT:  150 ml SPECIMENS: Placenta and fallopian tubes sent to pathology COMPLICATIONS: Postpartum hemorrhage  PROCEDURE IN DETAIL:  The patient preoperatively received intravenous antibiotics and had sequential compression devices applied to her lower extremities.  She was then taken to the operating room where the epidural anesthesia was dosed up to surgical level and was found to be adequate. She was then placed in a dorsal supine position with a leftward tilt. She was prepped and draped in a sterile manner.  A foley catheter was placed into her bladder with sterile technique and attached to constant gravity.  After a timeout was performed, a Pfannenstiel skin incision was made with scalpel over her preexisting scar and carried through to the underlying layer of fascia. The fascia was incised in the midline, and this incision was extended bilaterally using the Mayo scissors.  Kocher clamps were applied to the superior aspect of the fascial incision and the underlying rectus muscles were dissected off sharply.  A similar process was carried out on the inferior aspect of the fascial incision. The rectus muscles were separated in the midline sharply and the peritoneum was entered bluntly. The peritoneal incision was carefully extended bluntly laterally and caudad with good visualization of the bladder. The uterus appeared normal. The Alexis O-ring retractor was placed into the incision, taking care not to incorporate bowel or omentum. The vesicouterine peritoneum was identified and grasped using smooth pickups.  It was incised and extended laterally using the Metzenbaum scissors.  A bladder flap was then digitally created.  The bladder blade was inserted. Attention was turned to the lower uterine segment where a low transverse hysterotomy was made with a scalpel and extended bilaterally bluntly.  The infant  was delivered from cephalic position, nose and mouth were bulb suctioned, and the cord clamped and cut after 1  minute. The infant was then handed over to the waiting neonatology team. Uterine massage was then performed, and the placenta delivered intact with a three-vessel cord. The uterus was then cleared of clots and debris.  The hysterotomy was closed with 0 Vicryl in a running locked fashion, and an imbricating layer was also placed with 0 Vicryl. The fallopian tubes and ovaries were visualized bilaterally and normal appearing.   Attention was then turned to the right fallopian tube which was identified and grasped with a Babcock clamp, and followed out to the fimbriated end. Two Kelly clamps used under the tube with care to remove the fimbriated end and most of the tube on the right. Tube removed with Metzenbaum scissors. A 2-0 Vicryl in a Heaney type stitch used times 2 under each Burnard for hemostasis.  A similar process was carried out on the left side allowing for bilateral tubal sterilization.  Good hemostasis was noted overall.  The hysterotomy was inspected again and was found to be hemostatic. The Alexis retractor was removed.    The pelvis was cleared of all clot and debris. Hemostasis was again confirmed on all surfaces. The fascia was then closed using 0 Vicryl in a running fashion.  The subcutaneous layer was irrigated, then reapproximated with 2-0 plain gut. The skin was closed with a 4-0 Monocryl subcuticular stitch. The patient tolerated the procedure well. Sponge, lap, instrument and needle counts were correct x 3.  She was taken to the recovery room in stable condition.    Alain Sor, MD OB Fellow, Faculty Practice [facility] for [facility]

## 2023-10-29 NOTE — Transfer of Care (Signed)
 Immediate Anesthesia Transfer of Care Note  Patient: [name] Moreno  Procedure(s) Performed: CESAREAN SECTION, WITH BILATERAL TUBAL LIGATION (Bilateral) LIGATION, FALLOPIAN TUBE, BILATERAL (Bilateral)  Patient Location: PACU  Anesthesia Type:Epidural  Level of Consciousness: awake, alert , and oriented  Airway & Oxygen Therapy: Patient Spontanous Breathing  Post-op Assessment: Report given to RN and Post -op Vital signs reviewed and stable  Post vital signs: Reviewed and stable  Last Vitals:  Vitals Value Taken Time  BP 116/71 10/29/23 21:21  Temp    Pulse 82 10/29/23 21:25  Resp 19 10/29/23 21:25  SpO2 99 % 10/29/23 21:25  Vitals shown include unfiled device data.  Last Pain:  Vitals:   10/29/23 1746  PainSc: 8          Complications: No notable events documented.

## 2023-10-29 NOTE — Anesthesia Preprocedure Evaluation (Signed)
 Anesthesia Evaluation  Patient identified by MRN, date of birth, ID band Patient awake    Reviewed: Allergy & Precautions, NPO status , Patient's Chart, lab work & pertinent test results  Airway Mallampati: II  TM Distance: >3 FB Neck ROM: Full    Dental no notable dental hx.    Pulmonary neg pulmonary ROS, former smoker   Pulmonary exam normal breath sounds clear to auscultation       Cardiovascular negative cardio ROS Normal cardiovascular exam Rhythm:Regular Rate:Normal     Neuro/Psych  Headaches PSYCHIATRIC DISORDERS Anxiety Depression       GI/Hepatic ,GERD  ,,(+)     substance abuse (suboxone )    Endo/Other  negative endocrine ROS    Renal/GU negative Renal ROS  negative genitourinary   Musculoskeletal negative musculoskeletal ROS (+)  narcotic dependent  Abdominal   Peds  Hematology negative hematology ROS (+)   Anesthesia Other Findings Presents in labor. H/o C/Sx1. Would like repeat C/S  Reproductive/Obstetrics (+) Pregnancy                              Anesthesia Physical Anesthesia Plan  ASA: 2  Anesthesia Plan: Spinal   Post-op Pain Management: Ofirmev  IV (intra-op)*   Induction:   PONV Risk Score and Plan: 2 and Treatment may vary due to age or medical condition  Airway Management Planned: Natural Airway  Additional Equipment:   Intra-op Plan:   Post-operative Plan:   Informed Consent: I have reviewed the patients History and Physical, chart, labs and discussed the procedure including the risks, benefits and alternatives for the proposed anesthesia with the patient or authorized representative who has indicated his/her understanding and acceptance.     Dental advisory given  Plan Discussed with: CRNA  Anesthesia Plan Comments:         Anesthesia Quick Evaluation

## 2023-10-29 NOTE — Lactation Note (Signed)
 This note was copied from a baby's chart. Lactation Consultation Note  Patient Name: [name] Moreno Unijb'd Date: 10/29/2023 Age:41 hours  Mom chooses to formula feed.   Maternal Data    Feeding Nipple Type: Slow - flow  LATCH Score                    Lactation Tools Discussed/Used    Interventions    Discharge    Consult Status Consult Status: Complete    Amy Belloso G 10/29/2023, 11:15 PM

## 2023-10-29 NOTE — Discharge Summary (Signed)
 Postpartum Discharge Summary  Date of Service updated***     Patient Name: [name] Moreno DOB: Jul 23, 1980 MRN: [id number]  Date of admission: 10/29/2023 Delivery date:10/29/2023 Delivering provider: NICHOLAUS BURNARD HERO Date of discharge: 10/29/2023  Admitting diagnosis: Preterm labor [O60.00] H/O: C-section [Z98.891] Intrauterine pregnancy: [redacted]w[redacted]d     Secondary diagnosis:  Principal Problem:   S/P cesarean section Active Problems:   Preterm labor   H/O: C-section   History of bilateral salpingectomy  Additional problems: ***    Discharge diagnosis: Preterm Pregnancy Delivered and PPH                                              Post partum procedures:{Postpartum procedures:23558} Augmentation: N/A Complications: {OB Labor/Delivery Complications:20784}  Hospital course: Onset of Labor With Unplanned C/S   43 y.o. yo H5E9686 at [redacted]w[redacted]d was admitted for PPROM and latent labor on 10/29/2023. The patient went for cesarean section due to Elective Repeat and suspected placental abruption. Delivery details as follows: Membrane Rupture Time/Date:  ,   Delivery Method:C-Section, Low Transverse Operative Delivery:N/A Details of operation can be found in separate operative note. Patient had a postpartum course complicated by***.  She is ambulating,tolerating a regular diet, passing flatus, and urinating well.  Patient is discharged home in stable condition 10/29/23.  Newborn Data: Birth date:10/29/2023 Birth time:8:23 PM Gender:Female Living status:Living Apgars:7 ,8  Weight:2900 g  Magnesium  Sulfate received: {Mag received:30440022} BMZ received: No Rhophylac:N/A MMR:N/A T-DaP:*** Flu: Yes RSV Vaccine received: No Transfusion:{Transfusion received:30440034}  Immunizations received: Immunization History  Administered Date(s) Administered   Influenza, Seasonal, Injecte, Preservative Fre 06/21/2023   Influenza,inj,Quad PF,6+ Mos 01/09/2014, 01/15/2015   Influenza,inj,quad, With  Preservative 08/24/2016   Influenza-Unspecified 01/09/2014, 01/15/2015, 08/24/2016   PPD Test 12/22/2018, 12/26/2018, 08/07/2020   Pneumococcal Polysaccharide-23 08/24/2016   Tdap 12/01/2017    Physical exam  Vitals:   10/29/23 1932 10/29/23 2122 10/29/23 2130 10/29/23 2145  BP:  116/71 115/64 119/63  Pulse:  83 78 84  Resp:  20 16 (!) 23  Temp:  (!) 96.2 F (35.7 C)    TempSrc:  Axillary    SpO2:  97% 99% 99%  Weight: 97.5 kg     Height: 5' 4 (1.626 m)      General: {Exam; general:21111117} Lochia: {Desc; appropriate/inappropriate:30686::appropriate} Uterine Fundus: {Desc; firm/soft:30687} Incision: {Exam; incision:21111123} DVT Evaluation: {Exam; dvt:2111122} Labs: Lab Results  Component Value Date   WBC 8.4 10/29/2023   HGB 10.3 (L) 10/29/2023   HCT 32.3 (L) 10/29/2023   MCV 74.8 (L) 10/29/2023   PLT 140 (L) 10/29/2023      Latest Ref Rng & Units 10/29/2023    6:48 PM  CMP  Glucose 70 - 99 mg/dL 897   BUN 6 - 20 mg/dL <5   Creatinine 9.55 - 1.00 mg/dL 9.26   Sodium 864 - 854 mmol/L 136   Potassium 3.5 - 5.1 mmol/L 3.7   Chloride 98 - 111 mmol/L 106   CO2 22 - 32 mmol/L 19   Calcium  8.9 - 10.3 mg/dL 9.2   Total Protein 6.5 - 8.1 g/dL 6.3   Total Bilirubin 0.0 - 1.2 mg/dL 0.5   Alkaline Phos 38 - 126 U/L 118   AST 15 - 41 U/L 20   ALT 0 - 44 U/L 13    Edinburgh Score:    01/26/2018   11:07 AM  Edinburgh Postnatal Depression Scale Screening Tool  I have been able to laugh and see the funny side of things. 0  I have looked forward with enjoyment to things. 0  I have blamed myself unnecessarily when things went wrong. 0  I have been anxious or worried for no good reason. 2  I have felt scared or panicky for no good reason. 3  Things have been getting on top of me. 0  I have been so unhappy that I have had difficulty sleeping. 0  I have felt sad or miserable. 0  I have been so unhappy that I have been crying. 0  The thought of harming myself has  occurred to me. 0  Edinburgh Postnatal Depression Scale Total 5      Data saved with a previous flowsheet row definition   No data recorded  After visit meds:  Allergies as of 10/29/2023   No Known Allergies   Med Rec must be completed prior to using this [facility]***        Discharge home in stable condition Infant Feeding: Formula Infant Disposition:{CHL IP OB HOME WITH FNUYZM:76418} Discharge instruction: per After Visit Summary and Postpartum booklet. Activity: Advance as tolerated. Pelvic rest for 6 weeks.  Diet: {OB ipzu:78888878} Future Appointments: Future Appointments  Date Time Provider Department Center  11/01/2023  2:55 PM Lola Donnice HERO, MD [facility]  11/02/2023  2:00 PM [facility] PROVIDER 1 [facility]  11/02/2023  2:30 PM [facility] US1 [facility]   Follow up Visit: Message sent to [facility] 7/12  Please schedule this patient for a In person postpartum visit in 6 weeks with the following provider: Any provider. Additional Postpartum F/U:Postpartum Depression checkup and Incision check 1 week  High risk pregnancy complicated by: opioid use disorder, PTSD, benzodiazepine dependence Delivery mode:  C-Section, Low Transverse Anticipated Birth Control:  BTL done [facility]   10/29/2023 Alain Sor, MD

## 2023-10-29 NOTE — MAU Provider Note (Signed)
 History     CSN: [id number]  Arrival date and time: 10/29/23 1735   None     Chief Complaint  Patient presents with   Rupture of Membranes   HPI  [name] is a 43 y.o. female [id number] @ [redacted]w[redacted]d here in MAU with complaints of ROM and bleeding. She reports a gush of fluid around 0900 which soaked her bed. She then noticed some spotting when she wiped following the gush of fluid. She reports irregular, painful contractions for most of the day.  She reports good fetal movement.  Low lying placenta resolved Currently in the [facility] program at [facility].  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  Suspect this was made worse due to prednisone taper.  Will start PPI at 40 mg twice daily for 2 weeks then 40 mg daily.  Consider referral to GI for no improvement or worsening signs and symptoms.     We did discuss dietary triggers and recommendations for controlling GERD     Headache    Heart murmur    Multinodular goiter    Thyroid  nodule     Past Surgical History:  Procedure Laterality Date   CESAREAN SECTION N/A 01/24/2018   Procedure: CESAREAN SECTION;  Surgeon: Nicholaus Burnard HERO, MD;  Location: [facility] BIRTHING SUIT[facility];  Service: Obstetrics;  Laterality: N/A;    Family History  Problem Relation Age of Onset   Hypertension Other        Grandparents   Diabetes Other        Grandparents    Social History   Tobacco Use   Smoking status: Former    Current packs/day: 0.30    Average packs/day: 0.3 packs/day for 6.3 years  (1.9 ttl pk-yrs)    Types: Cigarettes    Start date: 07/01/2017   Smokeless tobacco: Never  Vaping Use   Vaping status: Never Used  Substance Use Topics   Alcohol use: Not Currently    Comment: occas. when not preg   Drug use: Not Currently    Allergies: No Known Allergies  Medications Prior to Admission  Medication Sig Dispense Refill Last Dose/Taking   Buprenorphine  HCl-Naloxone  HCl (SUBOXONE ) 8-2 MG FILM Place 1 Film under the tongue 2 (two) times daily. 30 each 0 Past Week   cyclobenzaprine  (FLEXERIL ) 5 MG tablet Take 1-2 tablets (5-10 mg total) by mouth 3 (three) times daily as needed for muscle spasms. Use caution taking with other sedating medication. 30 tablet 1 Past Week   Doxylamine -Pyridoxine  (DICLEGIS ) 10-10 MG TBEC Start with 2 tablets by mouth every evening. If symptoms persist, add 1 tablet every morning. If symptoms persist, add 1 tablet at mid-day. 60 tablet 3 10/29/2023   ondansetron  (ZOFRAN -ODT) 8 MG disintegrating tablet Take 1 tablet (8 mg total)  by mouth every 6 (six) hours as needed. 60 tablet 3 10/28/2023   pantoprazole  (PROTONIX ) 40 MG tablet Take 1 tablet (40 mg total) by mouth daily. 90 tablet 3 10/29/2023   polyethylene glycol powder (GLYCOLAX /MIRALAX ) 17 GM/SCOOP powder Take 17 g by mouth in the morning and at bedtime. 510 g 1 Past Week   Prenatal Vit-Fe Fumarate-FA (MULTIVITAMIN-PRENATAL) 27-0.8 MG TABS tablet Take 1 tablet by mouth daily at 12 noon.   10/29/2023   promethazine  (PHENERGAN ) 25 MG tablet Take 1 tablet (25 mg total) by mouth every 6 (six) hours as needed for nausea or vomiting. 30 tablet 1 10/28/2023   hydrOXYzine  (ATARAX ) 25 MG tablet Take 1 tablet (25 mg total) by mouth every 6 (six) hours as needed for itching. 30 tablet 2 More than a month   Results for orders placed or performed during the hospital encounter of 10/29/23 (from the past 48 hours)  Rupture of Membrane (ROM) Plus     Status: None   Collection Time: 10/29/23  6:25 PM  Result Value  Ref Range   Rom Plus POSITIVE     Comment: Performed at [facility], [address], [place], [place] [address]  Type and screen [facility]     Status: None (Preliminary result)   Collection Time: 10/29/23  6:43 PM  Result Value Ref Range   ABO/RH(D) PENDING    Antibody Screen PENDING    Sample Expiration      11/01/2023,2359 Performed at [facility], [address] [place], [place] [address]   Odetta Test     Status: Normal   Collection Time: 10/29/23  6:54 PM  Result Value Ref Range   POCT Fern Test Negative = intact amniotic membranes     Review of Systems  Gastrointestinal:  Positive for abdominal pain.  Genitourinary:  Positive for vaginal bleeding.   Physical Exam   Blood pressure (!) 147/69, pulse 97, temperature 98.5 F (36.9 C), resp. rate 17, last menstrual period 02/19/2023, SpO2 100%, unknown if currently breastfeeding.  Patient Vitals for the past 24 hrs:  BP Temp Pulse Resp SpO2  10/29/23 1850 -- -- -- -- 100 %  10/29/23 1845 -- -- -- -- 100 %  10/29/23 1844 (!) 143/83 -- (!) 105 -- --  10/29/23 1815 -- -- -- -- 99 %  10/29/23 1812 (!) 147/83 -- (!) 109 -- --  10/29/23 1810 -- -- -- -- 100 %  10/29/23 1805 -- -- -- -- 99 %  10/29/23 1800 -- -- -- -- 100 %  10/29/23 1755 (!) 147/69 -- 95 -- 100 %  10/29/23 1754 (!) 147/69 98.5 F (36.9 C) 97 17 100 %    Physical Exam Constitutional:      General: She is not in acute distress.    Appearance: Normal appearance. She is not ill-appearing, toxic-appearing or diaphoretic.  Abdominal:     Palpations: Abdomen is soft.  Genitourinary:    Comments: Attempted to completed speculum exam to evaluate for ROM.  [name] was unable to keep her legs in the stirups and felt like she had pressure in her bottom. Speculum deferred, Cervical exam done Dilation: 2.5, 70%, -3 Vertex  Exam by:: Analei Whinery,np  Skin:    General: Skin is warm.  Neurological:     Mental Status: She is alert and oriented  to person, place, and time.     MAU Course  Procedures  MDM  RN called NP to the room. Patient  increasingly uncomfortable with prolonged variable deceleration MD- Dr. Nicholaus called to the room. Cervix rechecked. Cervix 3 cm, 100%, -2 Prep for C/s at this time.  IV in place Several elevated BP's: preeclampsia labs collected.  Category 2 fetal tracing, prep for the OR  Assessment and Plan   A:  1. Preterm premature rupture of membranes (PPROM) with unknown onset of labor   2. [redacted] weeks gestation of pregnancy   3. Active preterm labor     P:  Prep for the OR GBS Unknown Preeclampsia labs pending.   Dorita Delon FERNS, NP 10/29/2023 7:23 PM

## 2023-10-30 LAB — CBC
HCT: 25.8 % — ABNORMAL LOW (ref 36.0–46.0)
Hemoglobin: 8.4 g/dL — ABNORMAL LOW (ref 12.0–15.0)
MCH: 24 pg — ABNORMAL LOW (ref 26.0–34.0)
MCHC: 32.6 g/dL (ref 30.0–36.0)
MCV: 73.7 fL — ABNORMAL LOW (ref 80.0–100.0)
Platelets: 128 K/uL — ABNORMAL LOW (ref 150–400)
RBC: 3.5 MIL/uL — ABNORMAL LOW (ref 3.87–5.11)
RDW: 15.2 % (ref 11.5–15.5)
WBC: 12.9 K/uL — ABNORMAL HIGH (ref 4.0–10.5)
nRBC: 0 % (ref 0.0–0.2)

## 2023-10-30 LAB — CREATININE, SERUM
Creatinine, Ser: 0.65 mg/dL (ref 0.44–1.00)
GFR, Estimated: >60 mL/min (ref >60)

## 2023-10-30 NOTE — Anesthesia Postprocedure Evaluation (Signed)
 Anesthesia Post Note  Patient: Murline Weigel  Procedure(s) Performed: CESAREAN SECTION, WITH BILATERAL TUBAL LIGATION (Bilateral) LIGATION, FALLOPIAN TUBE, BILATERAL (Bilateral)     Patient location during evaluation: PACU Anesthesia Type: Spinal Level of consciousness: oriented and awake and alert Pain management: pain level controlled Vital Signs Assessment: post-procedure vital signs reviewed and stable Respiratory status: spontaneous breathing, respiratory function stable and patient connected to nasal cannula oxygen Cardiovascular status: blood pressure returned to baseline and stable Postop Assessment: no headache, no backache and no apparent nausea or vomiting Anesthetic complications: no   No notable events documented.  Last Vitals:  Vitals:   10/30/23 0125 10/30/23 0214  BP: 135/78   Pulse: 72   Resp: 18   Temp: 36.7 C 36.7 C  SpO2: 98%     Last Pain:  Vitals:   10/30/23 0214  TempSrc: Oral  PainSc: 6    Pain Goal:                   Charleigh Correnti L Abran Gavigan

## 2023-10-30 NOTE — Clinical Social Work Maternal (Addendum)
 CLINICAL SOCIAL WORK MATERNAL/CHILD NOTE  Patient Details  Name: [name] Moreno MRN: [id number] Date of Birth: 06-22-1980  Date:  10/30/2023  Clinical Social Worker Initiating Note:  Sharyne Roulette, LCSWA Date/Time: Initiated:  10/30/23/1639     Child's Name:  [name] Moreno   Biological Parents:  Mother, Father (FOB: [name] Moreno, DOB: 03/31/1987)   Need for Interpreter:  None   Reason for Referral:  Current Substance Use/Substance Use During Pregnancy  , Behavioral Health Concerns   Address:  [address] [place] [address]    Phone number:  [phone] (home)     Additional phone number:   Household Members/Support Persons (HM/SP):   Household Member/Support Person 1, Household Member/Support Person 2   HM/SP Name Relationship DOB or Age  HM/SP -1 [name] Moreno FOB 03/31/1987  HM/SP -2 Schuyler Moreno Daughter 01/24/2018  HM/SP -3        HM/SP -4        HM/SP -5        HM/SP -6        HM/SP -7        HM/SP -8          Natural Supports (not living in the home):      Professional Supports: Organized support group (Comment) (Your Electrical engineer)   Employment: Environmental education officer   Type of Work: Lawyer   Education:  [employer] school graduate   Homebound arranged:    Surveyor, quantity Resources:  Medicaid   Other Resources:  [facility]   Cultural/Religious Considerations Which May Impact Care:    Strengths:  Ability to meet basic needs  , Home prepared for child  , Pediatrician chosen   Psychotropic Medications:         Pediatrician:    Armed forces operational officer area  Pediatrician List:   [facility]  [place]    [place]    [facility]      Pediatrician Fax Number:    Risk Factors/Current Problems:  Substance Use  , Mental Health Concerns     Cognitive State:  Able to Concentrate  , Alert  , Linear Thinking  , Goal Oriented     Mood/Affect:  Calm  , Comfortable  , Interested  , Relaxed      CSW Assessment: CSW was consulted due to Medication Assisted Treatment and substance use during pregnancy, and history of anxiety, depression, and PTSD. CSW met with [name] at bedside to complete assessment. When CSW entered room, [name] was observed sitting in hospital bed. Infant was observed on his back in bassinet asleep. CSW introduced self and explained reason for consult. [name] presented as calm, was agreeable to consult and remained engaged throughout encounter.   [name] confirmed demographic information listed in chart. [name] reports she lives with her significant other/FOB of infant and her daughter. [name] reports she has an adult son, [name] Moreno (DOB: 05/29/2003) who lives on his own. CSW inquired about [name]'s mental health history. [name] acknowledged diagnoses of depression, anxiety, and PTSD. [name] reports she was diagnosed with depression, anxiety and PTSD a long time ago, in her twenties. [name] reports she feels she has been coping better as time has gone on. [name] reports that she feels her mental health symptoms were worsened in the past due to being in a stressful living situation and feels she is in a better situation now. [name] reports her anxiety gets triggered at times and reports  endorsing some moments of anxiety towards the end of her pregnancy but did not feel she experienced depression or was negatively impacted by PTSD symptoms during her pregnancy. CSW inquired is [name] endorsed symptoms of postpartum depression/anxiety after the birth of her other 2 children. [name] reports she recalls symptoms of postpartum depression after the birth of both of her children, marked by crying a lot. [name] reports she did not have thoughts of harming herself or her children. [name] reports she was prescribed Zoloft after her son was born in 2005 and another antidepressant she was unable to recall the name of after her daughter's birth in 2019. [name] reports in the past, she was established with [facility] where she had a therapist  and prior to pregnancy, she received behavioral health care through [facility] where she was prescribed Xanax and attended therapy to manage symptoms of anxiety. [name] reports she is not current with a therapist or prescribed mental health medication currently but participates in a group at [facility] which she finds as helpful and supportive. [name] identified FOB and her family as her supports.   CSW provided education regarding the baby blues period vs. perinatal mood disorders, discussed treatment and gave resources for mental health follow up if concerns arise.  CSW recommends self-evaluation during the postpartum time period using the New Mom Checklist from Postpartum Progress and encouraged [name] to contact a medical professional if symptoms are noted at any time.    [name] reports she has all needed items for infant, including a car seat and bassinet.   CSW inquired about substance use during pregnancy. [name] reports prior to and in the beginning of pregnancy, [name] took opiate pain medication due to chronic back pain and established care through the [facility] at [facility] where she was prescribed Suboxone  for pain management during pregnancy. [name] reports prior to becoming pregnant, she was seen at [facility] for pain management. CSW inquired about [name] testing positive for opiates 05/24/23, 06/21/23, and 10/05/23 and testing positive for benzodiazepines 10/05/23. [name] states she took Xanax on one occasion due to anxiety during her pregnancy and denied taking opoid medication aside from Suboxone  since becoming established with [facility] clinic. [name] denied using other illicit substances during pregnancy.  CSW provided review of Sudden Infant Death Syndrome (SIDS) precautions.    While CSW was completing assessment, two visitors entered the room. [name] requested that CSW return at a later time to finish assessment. Weekday CSW to review hospital drug screen policy, assess for CPS  history, and assess for safety (SI/HI/domestic violence) prior to discharge. Barriers to discharge until clinical assessment has been completed by weekday CSW.   CSW Plan/Description:  Perinatal Mood and Anxiety Disorder (PMADs) Education, Hospital Drug Screen Policy Information, Sudden Infant Death Syndrome (SIDS) Education, CSW Will Continue to Monitor Umbilical Cord Tissue Drug Screen Results and Make Report if Warranted    Edyth Glomb K Alvilda Mckenna, LCSWA 10/30/2023, 5:53 PM

## 2023-10-30 NOTE — Progress Notes (Addendum)
 OB/GYN Faculty Attending Note  Post Op Day 1  Subjective: Patient is feeling okay. She reports not well controlled pain on current pain meds. She has not taken any oxycodone . She was on 8-2 suboxone  at home and reports she was taking this regularly. Wants to do oxycodone  while in hospital and transition to suboxone  when she is discharged. She is not ambulating yet. She is not voiding spontaneously, foley remains in place. She is not passing flatus, has not had a BM yet. She is tolerating a regular diet thus far but has had minimal to eat. Bleeding is moderate. She is bottle feeding. Baby is in room and doing well.  Objective: Blood pressure 107/64, pulse 68, temperature 98.2 F (36.8 C), temperature source Oral, resp. rate 17, height 5' 4 (1.626 m), weight 97.5 kg, last menstrual period 02/19/2023, SpO2 98%, unknown if currently breastfeeding. Temp:  [96.2 F (35.7 C)-98.5 F (36.9 C)] 98.2 F (36.8 C) (07/13 0849) Pulse Rate:  [60-109] 68 (07/13 0849) Resp:  [8-23] 17 (07/13 0849) BP: (107-147)/(62-86) 107/64 (07/13 0849) SpO2:  [95 %-100 %] 98 % (07/13 1209) Weight:  [97.5 kg] 97.5 kg (07/12 1932)  Physical Exam:  General: alert, oriented, cooperative Chest: normal respiratory effort Heart: regular rate  Abdomen: soft, appropriately tender to palpation, incision covered by dressing with no evidence of active bleeding  Uterine Fundus: firm, at the level of the umbilicus Lochia: moderate, rubra DVT Evaluation: no evidence of DVT Extremities: no edema, no calf tenderness  UOP: 25 mL/hr clear yellow urine  Recent Labs    10/29/23 2201 10/30/23 0522  HGB 8.5* 8.4*  HCT 26.4* 25.8*    Assessment/Plan: Patient Active Problem List   Diagnosis Date Noted   Preterm labor 10/29/2023   H/O: C-section 10/29/2023   S/P cesarean section 10/29/2023   History of bilateral salpingectomy 10/29/2023   Unwanted fertility 10/05/2023   Preterm delivery 10/04/2023   Low-lying placenta  07/12/2023   AMA (advanced maternal age) multigravida 35+ 07/12/2023   Alpha Thal carrier 07/04/2023   Suboxone  maintenance treatment complicating pregnancy, antepartum, second trimester (HCC) 07/04/2023   Cervical high risk HPV (human papillomavirus) test positive 06/13/2023   Supervision of high risk pregnancy, antepartum 05/19/2023   Previous cesarean section complicating pregnancy 05/19/2023   Chronic narcotic dependence (HCC) 05/19/2023   Benzodiazepine dependence (HCC) 05/19/2023   Substance dependence during pregnancy (HCC) 05/19/2023   Chronic midline thoracic back pain 06/12/2019   Chronic posttraumatic stress disorder 06/12/2019   Anxiety 02/06/2018   Anemia affecting pregnancy 01/10/2018   History of gestational diabetes 12/03/2017   Nontoxic uninodular goiter 11/13/2013   Depression    Multinodular goiter 01/04/2012    Patient is 43 y.o. H5E9686 POD#1 s/p RCS+BTL at [redacted]w[redacted]d for labor. Course complicated by PPH, now with H/H 8.4/25.8. She is doing well but had not been out of bed yet. Encouraged ambulation, take pain meds prn   Oxycodone  and transition to suboxone  later Continue routine post partum care Regular diet Lovenox  40 mg daily Kapowsin S/p BTL for birth control Plan for discharge possibly tomorrow, likely Tuesday    LOIS Yolanda Moats, MD, [facility] Attending Center for [facility] (Faculty Practice)  10/30/2023, 3:15 PM

## 2023-10-31 ENCOUNTER — Encounter (HOSPITAL_COMMUNITY): Payer: Self-pay | Admitting: Obstetrics and Gynecology

## 2023-10-31 LAB — RPR: RPR Ser Ql: NONREACTIVE

## 2023-10-31 MED ORDER — POTASSIUM CHLORIDE CRYS ER 20 MEQ PO TBCR
20.0000 meq | EXTENDED_RELEASE_TABLET | Freq: Every day | ORAL | Status: DC
  Administered 2023-10-31 – 2023-11-01 (×2): 20 meq via ORAL
  Filled 2023-10-31 (×2): qty 1

## 2023-10-31 MED ORDER — FERROUS SULFATE 325 (65 FE) MG PO TABS
325.0000 mg | ORAL_TABLET | ORAL | Status: DC
  Administered 2023-10-31: 325 mg via ORAL
  Filled 2023-10-31: qty 1

## 2023-10-31 MED ORDER — FUROSEMIDE 20 MG PO TABS
20.0000 mg | ORAL_TABLET | Freq: Every day | ORAL | Status: DC
  Administered 2023-10-31 – 2023-11-01 (×2): 20 mg via ORAL
  Filled 2023-10-31 (×2): qty 1

## 2023-10-31 MED ORDER — FERROUS SULFATE 325 (65 FE) MG PO TBEC
325.0000 mg | DELAYED_RELEASE_TABLET | ORAL | Status: DC
  Filled 2023-10-31: qty 1

## 2023-10-31 NOTE — Progress Notes (Addendum)
 OB/GYN Faculty Attending Note  Post Op Day 1  Subjective: Patient is feeling okay. Pain better controlled. Thinks she still wants to do oxycodone  in hospital and then transition to suboxone , though there are some discrepancies with her fill history.  Voiding urine. She is passing flatus, has not had a BM yet. She is tolerating a regular diet thus far but has had minimal to eat. Bleeding is moderate. She is bottle feeding. Baby is in room and doing well.  Objective: Blood pressure 112/70, pulse 70, temperature 97.7 F (36.5 C), temperature source Oral, resp. rate 16, height 5' 4 (1.626 m), weight 97.5 kg, last menstrual period 02/19/2023, SpO2 99%, unknown if currently breastfeeding. Temp:  [97.7 F (36.5 C)] 97.7 F (36.5 C) (07/14 0457) Pulse Rate:  [70] 70 (07/14 0457) Resp:  [16] 16 (07/14 0457) BP: (112)/(70) 112/70 (07/14 0457) SpO2:  [99 %] 99 % (07/14 0457)  Physical Exam:  General: alert, oriented, cooperative Chest: normal respiratory effort Heart: regular rate  Abdomen: soft, appropriately tender to palpation, incision covered by dressing with no evidence of active bleeding  Uterine Fundus: firm, at the level of the umbilicus Lochia: moderate, rubra DVT Evaluation: no evidence of DVT Extremities: no edema, no calf tenderness   Recent Labs    10/29/23 2201 10/30/23 0522  HGB 8.5* 8.4*  HCT 26.4* 25.8*    Assessment/Plan: Patient Active Problem List   Diagnosis Date Noted   Preterm labor 10/29/2023   H/O: C-section 10/29/2023   S/P cesarean section 10/29/2023   History of bilateral salpingectomy 10/29/2023   Unwanted fertility 10/05/2023   Preterm delivery 10/04/2023   Low-lying placenta 07/12/2023   AMA (advanced maternal age) multigravida 35+ 07/12/2023   Alpha Thal carrier 07/04/2023   Suboxone  maintenance treatment complicating pregnancy, antepartum, second trimester (HCC) 07/04/2023   Cervical high risk HPV (human papillomavirus) test positive  06/13/2023   Supervision of high risk pregnancy, antepartum 05/19/2023   Previous cesarean section complicating pregnancy 05/19/2023   Chronic narcotic dependence (HCC) 05/19/2023   Benzodiazepine dependence (HCC) 05/19/2023   Substance dependence during pregnancy (HCC) 05/19/2023   Chronic midline thoracic back pain 06/12/2019   Chronic posttraumatic stress disorder 06/12/2019   Anxiety 02/06/2018   Anemia affecting pregnancy 01/10/2018   History of gestational diabetes 12/03/2017   Nontoxic uninodular goiter 11/13/2013   Depression    Multinodular goiter 01/04/2012    Patient is 43 y.o. [id number] POD#1 s/p RCS+BTL at [redacted]w[redacted]d for labor. Course complicated by PPH, now with H/H 8.4/25.8 on oral iron. Ambulating more, encouraged ambulation, take pain meds prn   #Anemia - oral iron started  #Pain plan #SUD Will continue with prn oxycodone  while here, because of the discrepancies it may make the most sense to discharge her on the oxy and see if she wants to transition to suboxone  outpatient after a conversation about boundaries.    Regular diet Lovenox  40 mg daily [place] S/p BTL for birth control   Plan for discharge tomorrow    Chiquita Clover, MD PGY-3 FM Resident  10/31/2023, 12:44 PM    I repeated the exam and agree with above. Pt very uncomfortable w/ swelling in bilat LE. Mildly elevated BP on admission. Nml Now. Will start 5 days Lasix  and K for potassium repletion.   Pt reports incision pain. Is AF, Nml exam. Dressing w/ scant blood. Incision well-approximated through honeycomb dressing. Taking IBU, ES Tylenol , Gabapentin  300 mg and Oxy 10 on schedule. No evidence of infection or other post-op complications. Encouraged  ambulation.  Had previously been prescribed Suboxone  in [facility] clinic but Tox screens are not C/W consistent use so was not continued on admission.  Plan to address at [facility] appt. In 1 week at [facility] clinic.   Claudene, Keeana Pieratt , CNM 10/31/2023 5:55 PM

## 2023-10-31 NOTE — Progress Notes (Signed)
 CSW has followed up with [name] to complete the assessment.  CSW updated [name] about the infant's positive UDS.  CSW informed [name] due to Suboxone  during her pregnancy; the hospital will perform a UDS and CDS on the infant. If the screenings return with positive results a report to CPS will be made; [name] was understanding.   [name] explained her hx with substance use in the previous note by the weekend CSW Sharyne Roulette please refer to that note for more information.   CSW has completed a CPS report with [facility].    CSW identifies no further need for intervention and no barriers to discharge at this time.  [name] Moreno, [place] Clinical Social Worker [phone]

## 2023-11-01 ENCOUNTER — Other Ambulatory Visit (HOSPITAL_COMMUNITY): Payer: Self-pay

## 2023-11-01 ENCOUNTER — Ambulatory Visit: Payer: Self-pay | Admitting: Obstetrics and Gynecology

## 2023-11-01 ENCOUNTER — Encounter: Admitting: Family Medicine

## 2023-11-01 LAB — SURGICAL PATHOLOGY

## 2023-11-01 MED ORDER — FERROUS SULFATE 325 (65 FE) MG PO TABS
325.0000 mg | ORAL_TABLET | ORAL | 3 refills | Status: AC
  Filled 2023-11-01: qty 30, 60d supply, fill #0

## 2023-11-01 MED ORDER — BUPRENORPHINE HCL-NALOXONE HCL 8-2 MG SL FILM
1.0000 | ORAL_FILM | Freq: Two times a day (BID) | SUBLINGUAL | 0 refills | Status: DC
Start: 2023-11-01 — End: 2023-11-01

## 2023-11-01 MED ORDER — GABAPENTIN 300 MG PO CAPS
300.0000 mg | ORAL_CAPSULE | Freq: Three times a day (TID) | ORAL | 1 refills | Status: AC
  Filled 2023-11-01: qty 60, 20d supply, fill #0

## 2023-11-01 MED ORDER — FUROSEMIDE 20 MG PO TABS
20.0000 mg | ORAL_TABLET | Freq: Every day | ORAL | 0 refills | Status: DC
  Filled 2023-11-01: qty 5, 5d supply, fill #0

## 2023-11-01 MED ORDER — ACETAMINOPHEN 500 MG PO TABS: 1000.0000 mg | ORAL_TABLET | Freq: Four times a day (QID) | ORAL | 0 refills | Status: DC

## 2023-11-01 MED ORDER — OXYCODONE HCL 5 MG PO TABS
5.0000 mg | ORAL_TABLET | Freq: Four times a day (QID) | ORAL | 0 refills | Status: DC | PRN
  Filled 2023-11-01: qty 20, 3d supply, fill #0

## 2023-11-01 MED ORDER — IBUPROFEN 600 MG PO TABS: 600.0000 mg | ORAL_TABLET | Freq: Four times a day (QID) | ORAL | 0 refills | Status: DC

## 2023-11-01 MED ORDER — FERROUS SULFATE 325 (65 FE) MG PO TABS: 325.0000 mg | ORAL_TABLET | ORAL | 3 refills | Status: DC

## 2023-11-01 MED ORDER — ACETAMINOPHEN 500 MG PO TABS
1000.0000 mg | ORAL_TABLET | Freq: Four times a day (QID) | ORAL | 0 refills | Status: DC
  Filled 2023-11-01: qty 30, 4d supply, fill #0

## 2023-11-01 MED ORDER — IBUPROFEN 600 MG PO TABS
600.0000 mg | ORAL_TABLET | Freq: Four times a day (QID) | ORAL | 0 refills | Status: DC
  Filled 2023-11-01: qty 30, 8d supply, fill #0

## 2023-11-01 MED ORDER — FUROSEMIDE 20 MG PO TABS: 20.0000 mg | ORAL_TABLET | Freq: Every day | ORAL | 0 refills | Status: DC

## 2023-11-01 MED ORDER — GABAPENTIN 300 MG PO CAPS: 300.0000 mg | ORAL_CAPSULE | Freq: Three times a day (TID) | ORAL | 1 refills | Status: DC

## 2023-11-01 NOTE — Consult Note (Signed)
   Outpatient Lactation Booking Note  Visited parent to establish outpatient lactation care. Mom will be formula feeding baby. LC reviewed breast changes, engorgement management, signs and symptoms of mastitis, when and who to call for help.    Parent declined outpatient lactation care.  [name] Moreno, [facility] Center for [facility]

## 2023-11-01 NOTE — Patient Instructions (Signed)

## 2023-11-01 NOTE — Progress Notes (Signed)
 CSW followed up with [facility] CPS intake; and Per the intake reporter this CPS report was screened out by Supervisor Delon Sar.  CSW has followed up with the NICU social worker and support staff.  CSW identifies no further need for intervention and no barriers to discharge at this time.  Rosina Molt, [place] Clinical Social Worker [phone]

## 2023-11-02 ENCOUNTER — Ambulatory Visit

## 2023-11-08 ENCOUNTER — Other Ambulatory Visit: Payer: Self-pay | Admitting: Family Medicine

## 2023-11-08 ENCOUNTER — Ambulatory Visit (INDEPENDENT_AMBULATORY_CARE_PROVIDER_SITE_OTHER): Admitting: Advanced Practice Midwife

## 2023-11-08 ENCOUNTER — Other Ambulatory Visit: Payer: Self-pay

## 2023-11-08 ENCOUNTER — Other Ambulatory Visit (HOSPITAL_COMMUNITY)
Admission: RE | Admit: 2023-11-08 | Discharge: 2023-11-08 | Disposition: A | Discharge status: Home / Self Care | Source: Ambulatory Visit | Attending: Family Medicine | Admitting: Family Medicine

## 2023-11-08 ENCOUNTER — Encounter (HOSPITAL_COMMUNITY): Payer: Self-pay | Admitting: Obstetrics & Gynecology

## 2023-11-08 ENCOUNTER — Encounter: Payer: Self-pay | Admitting: Family Medicine

## 2023-11-08 ENCOUNTER — Ambulatory Visit

## 2023-11-08 ENCOUNTER — Inpatient Hospital Stay (HOSPITAL_COMMUNITY)
Admission: AD | Admit: 2023-11-08 | Discharge: 2023-11-08 | Disposition: A | Discharge status: Home / Self Care | Attending: Obstetrics & Gynecology | Admitting: Obstetrics & Gynecology

## 2023-11-08 VITALS — BP 152/91 | HR 82 | Wt 188.2 lb

## 2023-11-08 DIAGNOSIS — F1120 Opioid dependence, uncomplicated: Secondary | ICD-10-CM

## 2023-11-08 DIAGNOSIS — N898 Other specified noninflammatory disorders of vagina: Secondary | ICD-10-CM

## 2023-11-08 DIAGNOSIS — Z79891 Long term (current) use of opiate analgesic: Secondary | ICD-10-CM

## 2023-11-08 DIAGNOSIS — R300 Dysuria: Secondary | ICD-10-CM

## 2023-11-08 DIAGNOSIS — R519 Headache, unspecified: Secondary | ICD-10-CM

## 2023-11-08 DIAGNOSIS — O165 Unspecified maternal hypertension, complicating the puerperium: Secondary | ICD-10-CM

## 2023-11-08 DIAGNOSIS — O9081 Anemia of the puerperium: Secondary | ICD-10-CM | POA: Diagnosis not present

## 2023-11-08 DIAGNOSIS — Z5181 Encounter for therapeutic drug level monitoring: Secondary | ICD-10-CM | POA: Diagnosis not present

## 2023-11-08 DIAGNOSIS — Z98891 History of uterine scar from previous surgery: Secondary | ICD-10-CM

## 2023-11-08 DIAGNOSIS — M546 Pain in thoracic spine: Secondary | ICD-10-CM

## 2023-11-08 DIAGNOSIS — G8929 Other chronic pain: Secondary | ICD-10-CM

## 2023-11-08 DIAGNOSIS — D509 Iron deficiency anemia, unspecified: Secondary | ICD-10-CM | POA: Diagnosis not present

## 2023-11-08 DIAGNOSIS — G8918 Other acute postprocedural pain: Secondary | ICD-10-CM | POA: Insufficient documentation

## 2023-11-08 DIAGNOSIS — O9089 Other complications of the puerperium, not elsewhere classified: Secondary | ICD-10-CM

## 2023-11-08 LAB — CBC
HCT: 30.6 % — ABNORMAL LOW (ref 36.0–46.0)
Hemoglobin: 9.5 g/dL — ABNORMAL LOW (ref 12.0–15.0)
MCH: 23.3 pg — ABNORMAL LOW (ref 26.0–34.0)
MCHC: 31 g/dL (ref 30.0–36.0)
MCV: 75 fL — ABNORMAL LOW (ref 80.0–100.0)
Platelets: 374 K/uL (ref 150–400)
RBC: 4.08 MIL/uL (ref 3.87–5.11)
RDW: 15.1 % (ref 11.5–15.5)
WBC: 7.3 K/uL (ref 4.0–10.5)
nRBC: 0 % (ref 0.0–0.2)

## 2023-11-08 LAB — COMPREHENSIVE METABOLIC PANEL WITH GFR
ALT: 20 U/L (ref 0–44)
AST: 22 U/L (ref 15–41)
Albumin: 3.1 g/dL — ABNORMAL LOW (ref 3.5–5.0)
Alkaline Phosphatase: 107 U/L (ref 38–126)
Anion gap: 12 (ref 5–15)
BUN: 10 mg/dL (ref 6–20)
CO2: 25 mmol/L (ref 22–32)
Calcium: 9 mg/dL (ref 8.9–10.3)
Chloride: 103 mmol/L (ref 98–111)
Creatinine, Ser: 0.96 mg/dL (ref 0.44–1.00)
GFR, Estimated: >60 mL/min (ref >60)
Glucose, Bld: 116 mg/dL — ABNORMAL HIGH (ref 70–99)
Potassium: 4.1 mmol/L (ref 3.5–5.1)
Sodium: 140 mmol/L (ref 135–145)
Total Bilirubin: 0.4 mg/dL (ref 0.0–1.2)
Total Protein: 7.1 g/dL (ref 6.5–8.1)

## 2023-11-08 LAB — POCT URINALYSIS DIP (DEVICE)
Bilirubin Urine: NEGATIVE
Glucose, UA: NEGATIVE mg/dL
Ketones, ur: NEGATIVE mg/dL
Nitrite: NEGATIVE
Protein, ur: 100 mg/dL — AB
Specific Gravity, Urine: 1.025 (ref 1.005–1.030)
Urobilinogen, UA: 0.2 mg/dL (ref 0.0–1.0)
pH: 6 (ref 5.0–8.0)

## 2023-11-08 MED ORDER — AMLODIPINE BESYLATE 5 MG PO TABS: 5.0000 mg | ORAL_TABLET | Freq: Every day | ORAL | 2 refills | Status: AC

## 2023-11-08 MED ORDER — ACETAMINOPHEN 500 MG PO TABS: 1000.0000 mg | ORAL_TABLET | Freq: Four times a day (QID) | ORAL | Status: AC

## 2023-11-08 MED ORDER — OXYCODONE HCL 5 MG PO TABS: 5.0000 mg | ORAL_TABLET | Freq: Four times a day (QID) | ORAL | 0 refills | Status: AC | PRN

## 2023-11-08 MED ORDER — IBUPROFEN 600 MG PO TABS: 600.0000 mg | ORAL_TABLET | Freq: Four times a day (QID) | ORAL | 1 refills | Status: AC

## 2023-11-08 MED ORDER — AMLODIPINE BESYLATE 5 MG PO TABS
5.0000 mg | ORAL_TABLET | Freq: Every day | ORAL | Status: DC
  Filled 2023-11-08: qty 1

## 2023-11-08 NOTE — Progress Notes (Signed)
 Subjective:   [name] Moreno is a 43 y.o. [phone] here today for postpartum care/incision check; substance use management and substance exposed newborn care.  [name] Moreno reports beginning to feel better and has good family support but increased stress because her son remains in the NICU with feeding difficulties.  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  Suspect this was made worse due to prednisone taper.  Will start PPI at 40 mg twice daily for 2 weeks then 40 mg daily.  Consider referral to GI for no improvement or worsening signs and symptoms.     We did discuss dietary triggers and recommendations for controlling GERD     Headache    Heart murmur    Multinodular goiter    Thyroid  nodule     Past Surgical History:  Procedure Laterality Date   CESAREAN SECTION N/A 01/24/2018   Procedure: CESAREAN SECTION;  Surgeon: Nicholaus Burnard HERO, MD;  Location: [facility] BIRTHING SUITES;  Service: Obstetrics;  Laterality: N/A;   CESAREAN SECTION WITH BILATERAL TUBAL LIGATION Bilateral 10/29/2023   Procedure: CESAREAN SECTION, WITH BILATERAL TUBAL LIGATION;  Surgeon: Nicholaus Burnard HERO, MD;  Location: [facility] LD ORS;  Service: Obstetrics;  Laterality: Bilateral;   TUBAL LIGATION Bilateral 10/29/2023   Procedure: LIGATION, FALLOPIAN TUBE, BILATERAL;  Surgeon: Nicholaus Burnard HERO, MD;  Location: [facility] LD ORS;  Service: Obstetrics;  Laterality: Bilateral;    The following portions of the patient's history were reviewed and updated as appropriate: allergies, current  medications, past family history, past medical history, past social history, past surgical history and problem list.   Objective:   [name] Moreno is well appearing in no distress.  She is asking appropriate questions, particularly related to her son's NICU stay.  Assessment and Plan:  Reviewed [name] Moreno's questions related to NICU and feeding progression.  It appears he son has had a recent formula change due to feeding intolerance but is beginning to improve PO intake.  She is concerned about his ongoing diaper dermatitis.  Wr reviewed strategies for management including open to air and barrier creams in addition to the medicinal honey that is currently being used. She verbalizes stress related to incorporating her 43 year old daughter into her son's care.  [facility] Support Network referral placed requesting a sibling kit.  [name] Moreno has substance exposed newborn consult contact information if needs arise before next visit.  Will also follow in NICU. Problem List Items Addressed This Visit       Other   Chronic narcotic dependence (HCC)   S/P cesarean section   Encounter for monitoring Suboxone  maintenance therapy   Other Visit Diagnoses       Dysuria    -  Primary     Vaginal odor         Headache in pregnancy, postpartum         Postpartum hypertension           Routine preventative health maintenance measures emphasized. Please refer to After  Visit Summary for other counseling recommendations.   No follow-ups on file.    Total face-to-face time with patient: 10 minutes.  Over 50% of encounter was spent on counseling and coordination of care.   Delon LITTIE Frater, NNP-BC Neonatal Nurse Practitioner Substance Exposed Newborn Consult at the [facility] [phone]

## 2023-11-08 NOTE — Progress Notes (Signed)
 [name] Moreno is a 43 y.o. [phone] here today for incision and mood check. Pt is 10 days PP repeat C/S. Hx opioid and benzo use. Was prescribed Suboxone  but Tox screens have not been C/W using as prescribed. Suboxone  was NOT continued during her hospitalization for this reason. Was prescribed Oxycodone  along with other pain meds for pain in the hospital and for D/C.   Reports: Incision pain since surgery. Has not removed dressing. No fever, bleeding or drainage.   HA since yesterday 7/21. No improvement w/ IBU. New swelling in bilat feet.  Anxiety and difficulty coping w/ baby being in NICU, but says he is doing well.  Dysuria Vaginal odor worse than after other deliveries.   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  Suspect this was made worse due to prednisone taper.  Will start PPI at 40 mg twice daily for 2 weeks then 40 mg daily.  Consider referral to GI for no improvement or worsening signs and symptoms.     We did discuss dietary triggers and recommendations for controlling GERD     Headache    Heart murmur    Multinodular goiter    Thyroid  nodule     Past Surgical History:  Procedure Laterality Date   CESAREAN SECTION N/A 01/24/2018   Procedure: CESAREAN SECTION;  Surgeon: Nicholaus Burnard HERO, MD;  Location: [facility] BIRTHING SUITES;  Service: Obstetrics;  Laterality: N/A;   CESAREAN SECTION WITH BILATERAL TUBAL LIGATION Bilateral 10/29/2023   Procedure: CESAREAN SECTION, WITH BILATERAL TUBAL LIGATION;  Surgeon: Nicholaus Burnard HERO,  MD;  Location: [facility] LD ORS;  Service: Obstetrics;  Laterality: Bilateral;   TUBAL LIGATION Bilateral 10/29/2023   Procedure: LIGATION, FALLOPIAN TUBE, BILATERAL;  Surgeon: Nicholaus Burnard HERO, MD;  Location: [facility] LD ORS;  Service: Obstetrics;  Laterality: Bilateral;    The following portions of the patient's history were reviewed and updated as appropriate: allergies, current medications, past family history, past medical history, past social history, past surgical history and problem list.   Health Maintenance:   Last pap:  Result Date Procedure Results Follow-ups  06/07/2023 Cytology - PAP( [place]) High risk HPV: Positive (A) Neisseria Gonorrhea: Negative Chlamydia: Negative HPV 16: Positive (A) HPV 18 / 45: Negative Adequacy: Satisfactory for evaluation; transformation zone component PRESENT. Diagnosis: - Low grade squamous intraepithelial lesion (LSIL) (A) Comment: Normal Reference [place] Chlamydia - Negative Comment: Normal Reference Range Neisseria Gonorrhea - Negative Comment: Normal Reference Range HPV - Negative Comment: Normal Reference Range HPV 16- Negative Comment: Normal Reference Range HPV 16 18 45 -Negative   08/15/2017 Cytology - PAP Adequacy: Satisfactory for evaluation  endocervical/transformation zone component PRESENT. Diagnosis: NEGATIVE FOR INTRAEPITHELIAL LESIONS OR MALIGNANCY. Bacterial vaginitis: POSITIVE for Gardnerella vaginalis (A) Candida vaginitis: Negative for Candida species Chlamydia: Negative Neisseria Gonorrhea: Negative Trichomonas: Negative HPV: NOT DETECTED Material Submitted: CervicoVaginal Pap [ThinPrep Imaged] CYTOLOGY - PAP: PAP RESULT     Last LFTs: Lab Results  Component Value Date  ALT 13 10/29/2023   AST 20 10/29/2023   ALKPHOS 118 10/29/2023   BILITOT 0.5 10/29/2023     Review of Systems:  Pertinent items noted in HPI and remainder of comprehensive ROS otherwise negative.  Physical Exam:  First BP 190's SBP  BP (!) 152/91    Pulse 82   Wt 188 lb 3.2 oz (85.4 kg)   LMP 02/19/2023   Breastfeeding No   BMI 32.30 kg/m  BP (!) 152/91   Pulse 82   Wt 188 lb 3.2 oz (85.4 kg)   LMP 02/19/2023   Breastfeeding No   BMI 32.30 kg/m   CONSTITUTIONAL: Well-developed, well-nourished female in no acute distress.  HEENT:  Normocephalic, atraumatic. External right and left ear normal. No scleral icterus.  SKIN: No rash noted. Not diaphoretic. No erythema. No pallor. MUSCULOSKELETAL: Normal range of motion. 1+ bilat pedal edema noted. NEUROLOGIC: Alert and oriented to person, place, and time. Normal muscle tone coordination.  PSYCHIATRIC: Normal affect, tearful but appropriate about baby in NICU. Normal behavior. Normal judgment and thought content. RESPIRATORY: Effort normal, no problems with respiration noted ABDOMEN: No masses noted. No other overt distention noted.  Dressing removed. Incision healing well. No drainage, erythema, edema, warmth.  PELVIC: self swab collected  Labs and Imaging I have reviewed the PDMP during this encounter.   Last UDS: Lab Results  Component Value Date   CREATIUR 159 10/05/2023      Assessment and Plan:  1. Dysuria (Primary) - Cervicovaginal ancillary only( [place]) - Urine Culture  2. Vaginal odor - Cervicovaginal ancillary only( )  3. Headache in pregnancy, postpartum - Protein / creatinine ratio, urine  4. Postpartum hypertension - Protein / creatinine ratio, urine - Sent to MAU for Pre-E work-up. Discussed possible need for Mag Sulfate if Dx Pre-E /w severe features.  - BP check 1 week - Will have MAU staff determine BP meds after Dx determined.   5. S/P cesarean section - Healing well.  6. Encounter for monitoring Suboxone  maintenance therapy - ToxAssure Flex 15, Ur - Will address Suboxone  plans at future visit.   7. Chronic narcotic dependence (HCC) - ToxAssure Flex 15, Ur  8. Chronic midline thoracic back pain   Return in about 1 week  (around 11/15/2023) for [facility].   Future Appointments  Date Time Provider Department Center  11/15/2023  2:35 PM Lola Donnice HERO, MD [facility]  12/13/2023  1:35 PM Lola Donnice HERO, MD [facility]    Total face-to-face time with patient: 20 minutes.  Over 50% of encounter was spent on counseling and coordination of care.   Kiante Petrovich  Claudene HOWARD 11/08/2023 4:31 PM Center for [employer] Faculty Practice, [facility]

## 2023-11-08 NOTE — MAU Provider Note (Addendum)
 S [name] is a 43 y.o. [id number] postpartum 7/12 from LTCS who presents to MAU today for severe range BP at OB f/u. Reports that she had a headache last night that resolved with rest and denied any aura or focal neurologic deficits. Previously had edema that has resolved with lasix  and compression stockings, no complications during pregnancy. She reports vaginal bleeding stopped a few days ago and restarted going up the stairs a few times. Minimal abdominal cramps or pain, mostly para-incisional pain. Denies Fever, N/V, chest pain, SOB, migraine with aura, or RUQ pain.  Reports severe pain because she has to go up flights of stairs daily both in the house and to get to the house. Used her last oxycodone  this AM and reports using it almost every 6 hours since discharge. Reports pain is 8/10 mostly with moving and walking. Reports pain is also sever with chasing 41 year old around the house.  Receives care at [facility] Prenatal records reviewed.  Pertinent items noted in HPI and remainder of comprehensive ROS otherwise negative.   O BP (!) 156/80   Pulse 76   Temp 98.1 F (36.7 C) (Oral)   Resp 18   SpO2 100%  Physical Exam Constitutional:      General: She is not in acute distress.    Appearance: Normal appearance.  Cardiovascular:     Rate and Rhythm: Normal rate and regular rhythm.  Pulmonary:     Effort: Pulmonary effort is normal.     Breath sounds: Normal breath sounds.  Abdominal:     General: Abdomen is flat. There is no distension.     Tenderness: There is abdominal tenderness.     Comments: LTCS dry with no erythema. Mildly tender.  Musculoskeletal:        General: No swelling or tenderness.  Skin:    General: Skin is warm and dry.  Neurological:     General: No focal deficit present.     Mental Status: She is alert.  Psychiatric:        Mood and Affect: Mood normal.        Behavior: Behavior normal.        Thought Content: Thought content normal.       MDM: Postpartum hypertension: CBC consistent with moderate iron deficiency anemia, otherwise normal, unremarkable CMP and no migraines with aura make pre-eclampsia low suspicion. Possibly related to pain or stress of NICU baby and post-operative recovery, will follow-up after 6 weeks postpartum to reevaluate for chronic hypertension. 173/99 BP in clinic, repeats at triage have been mildly elevated 146/84 repeat 156/82. Moderate iron deficiency anemia: Hgb 9.5 today and 8.4 prior to discharge. Reports fatigue but no dizziness with standing or light-headed. Post surgical pain: reports running out of oxycodone  today, has f/u with Dr. Lola 11/15/23. Gabapentin  reportedly not helping much and use using the Advil  and tylenol .  MAU Course: CBC and CMP drawn in triage with n  A Postpartum Hypertension, uncontrolled will reevaluate at outpatient clinic after 6 weeks postpartum -amlodipine  5 mg PO daily -Home blood pressure log  Iron deficiency anemia -referred for IV iron infusion 500 mg for tomorrow and in two weeks -Iron supplement PO daily.  Post surgical pain -Stagger Ibuprofen  and tylenol  as prescribed after discharge -Continue Gabapentin  as prescribed -Given 5 tablets of oxycodone  for breakthrough pain given pts circumstance.  Medical screening exam complete  P Discharge from MAU in stable condition with return precautions -amlodipine  5 mg PO daily -Home blood pressure  log -referred for IV iron infusion to recover deficit, orders place din navigator -continue Iron supplement PO daily.  Follow up at [facility] as scheduled for ongoing post-partum care  Allergies as of 11/08/2023   No Known Allergies      Medication List     TAKE these medications    acetaminophen  500 MG tablet Commonly known as: TYLENOL  Take 2 tablets (1,000 mg total) by mouth every 6 (six) hours.   amLODipine  5 MG tablet Commonly known as: NORVASC  Take 1 tablet (5 mg total) by mouth daily.   FeroSul  325 (65 Fe) MG tablet Generic drug: ferrous sulfate  Take 1 tablet (325 mg total) by mouth every other day.   gabapentin  300 MG capsule Commonly known as: NEURONTIN  Take 1 capsule (300 mg total) by mouth 3 (three) times daily.   hydrOXYzine  25 MG tablet Commonly known as: ATARAX  Take 1 tablet (25 mg total) by mouth every 6 (six) hours as needed for itching.   ibuprofen  600 MG tablet Commonly known as: ADVIL  Take 1 tablet (600 mg total) by mouth every 6 (six) hours.   multivitamin-prenatal 27-0.8 MG Tabs tablet Take 1 tablet by mouth daily at 12 noon.   oxyCODONE  5 MG immediate release tablet Commonly known as: Oxy IR/ROXICODONE  Take 1 tablet (5 mg total) by mouth every 6 (six) hours as needed for up to 5 doses for moderate pain (pain score 4-6). What changed: how much to take   pantoprazole  40 MG tablet Commonly known as: Protonix  Take 1 tablet (40 mg total) by mouth daily.   polyethylene glycol powder 17 GM/SCOOP powder Commonly known as: GLYCOLAX /MIRALAX  Take 17 g by mouth in the morning and at bedtime.        Lorrane Pac, MD 11/08/2023 6:41 PM    Attestation of Attending Supervision of Resident: Evaluation and management procedures were performed by the [facility] Medicine Resident under my supervision.  I have reviewed the Resident's note and chart, and I agree with the management and plan.  Suzen Maryan Masters, MD, MPH, ABFM Attending Physician Faculty Practice- Center for [facility]

## 2023-11-08 NOTE — Discharge Instructions (Addendum)
 Blood pressure: Take the amlodipine  every morning for your blood pressure, you will follow-up with this with Dr. Lola next week.  Pain: I have refilled your ibuprofen , and you can by the tylenol  over the counter. I have also sent in 5 more 5 mg Oxycodone  for you to take for breakthrough pain. Use them sparringly and stick to the regiment of your gabapentin , tylenol , and ibuprofen . Also look for an abdominal binder at the [facility] today and wear it whenever you are moving around.  Anemia: The infusion center should call you for your iron infusions tonight or tomorrow.

## 2023-11-08 NOTE — MAU Note (Signed)
 [name] Moreno is a 44 y.o. at Unknown here in MAU reporting: she was instructed to go to MAU for Pre Eclampsia blood work. Denies HA, visual disturbances, and epigastric pain.  S/P 10/29/2023 via Cesarean.  LMP: NA Onset of complaint: today Pain score: 9 Vitals:   11/08/23 1647  BP: (!) 146/84  Pulse: 80  Resp: 18  Temp: 98.1 F (36.7 C)  SpO2: 100%     FHT: NA  Lab orders placed from triage: None

## 2023-11-09 ENCOUNTER — Telehealth: Payer: Self-pay

## 2023-11-09 NOTE — Telephone Encounter (Signed)
 Dr. Eldonna, patient will be scheduled as soon as possible.  Auth Submission: NO AUTH NEEDED Site of care: Site of care: [facility] Payer: [employer] medicaid Medication & CPT/J Code(s) submitted: Venofer (Iron Sucrose) J1756 Diagnosis Code:  Route of submission (phone, fax, portal):  Phone # Fax # Auth type: Buy/Bill PB Units/visits requested: 500mg  x 2 doses Reference number:  Approval from: 11/09/23 to 03/11/24

## 2023-11-10 LAB — CERVICOVAGINAL ANCILLARY ONLY
Bacterial Vaginitis (gardnerella): POSITIVE — AB
Candida Glabrata: NEGATIVE
Candida Vaginitis: NEGATIVE
Chlamydia: NEGATIVE
Comment: NEGATIVE
Comment: NEGATIVE
Comment: NEGATIVE
Comment: NEGATIVE
Comment: NEGATIVE
Comment: NORMAL
Neisseria Gonorrhea: NEGATIVE
Trichomonas: NEGATIVE

## 2023-11-10 LAB — URINE CULTURE

## 2023-11-13 LAB — OPIATE CLASS, MS, UR RFX
Codeine: NOT DETECTED ng/mg{creat}
Dihydrocodeine: NOT DETECTED ng/mg{creat}
Hydrocodone: NOT DETECTED ng/mg{creat}
Hydromorphone: NOT DETECTED ng/mg{creat}
Morphine: NOT DETECTED ng/mg{creat}
Norcodeine: NOT DETECTED ng/mg{creat}
Norhydrocodone: NOT DETECTED ng/mg{creat}
Normorphine: NOT DETECTED ng/mg{creat}
Opiate Class Confirmation: NEGATIVE

## 2023-11-13 LAB — TOXASSURE FLEX 15, UR
6-ACETYLMORPHINE IA: NEGATIVE ng/mL
7-aminoclonazepam: NOT DETECTED ng/mg{creat}
AMPHETAMINES IA: NEGATIVE ng/mL
Alpha-hydroxyalprazolam: NOT DETECTED ng/mg{creat}
Alpha-hydroxymidazolam: NOT DETECTED ng/mg{creat}
Alpha-hydroxytriazolam: NOT DETECTED ng/mg{creat}
Alprazolam: NOT DETECTED ng/mg{creat}
BARBITURATES IA: NEGATIVE ng/mL
BUPRENORPHINE: NEGATIVE
Benzodiazepines: NEGATIVE
Buprenorphine: NOT DETECTED ng/mg{creat}
CANNABINOIDS IA: NEGATIVE ng/mL
COCAINE METABOLITE IA: NEGATIVE ng/mL
Clonazepam: NOT DETECTED ng/mg{creat}
Creatinine: 215 mg/dL (ref >=20)
Desalkylflurazepam: NOT DETECTED ng/mg{creat}
Desmethyldiazepam: NOT DETECTED ng/mg{creat}
Desmethylflunitrazepam: NOT DETECTED ng/mg{creat}
Diazepam: NOT DETECTED ng/mg{creat}
ETHYL ALCOHOL Enzymatic: NEGATIVE g/dL
FENTANYL: NEGATIVE
Fentanyl: NOT DETECTED ng/mg{creat}
Flunitrazepam: NOT DETECTED ng/mg{creat}
Lorazepam: NOT DETECTED ng/mg{creat}
METHADONE IA: NEGATIVE ng/mL
METHADONE MTB IA: NEGATIVE ng/mL
Midazolam: NOT DETECTED ng/mg{creat}
Norbuprenorphine: NOT DETECTED ng/mg{creat}
Norfentanyl: NOT DETECTED ng/mg{creat}
Oxazepam: NOT DETECTED ng/mg{creat}
PHENCYCLIDINE IA: NEGATIVE ng/mL
TAPENTADOL, IA: NEGATIVE ng/mL
TRAMADOL IA: NEGATIVE ng/mL
Temazepam: NOT DETECTED ng/mg{creat}

## 2023-11-13 LAB — OXYCODONE CLASS, MS, UR RFX
Noroxycodone: 2938 ng/mg{creat}
Noroxymorphone: 1035 ng/mg{creat}
Oxycodone Class Confirmation: POSITIVE
Oxycodone: 1914 ng/mg{creat}
Oxymorphone: 2839 ng/mg{creat}

## 2023-11-13 LAB — PROTEIN / CREATININE RATIO, URINE
Creatinine, Urine: 212.5 mg/dL
Protein, Ur: 35.1 mg/dL
Protein/Creat Ratio: 165 mg/g{creat} (ref 0–200)

## 2023-11-15 ENCOUNTER — Ambulatory Visit: Admitting: Family Medicine

## 2023-11-16 ENCOUNTER — Ambulatory Visit: Payer: Self-pay | Admitting: Advanced Practice Midwife

## 2023-11-16 DIAGNOSIS — N760 Acute vaginitis: Secondary | ICD-10-CM

## 2023-11-16 MED ORDER — METRONIDAZOLE 500 MG PO TABS: 500.0000 mg | ORAL_TABLET | Freq: Two times a day (BID) | ORAL | 0 refills | Status: AC

## 2023-11-19 ENCOUNTER — Inpatient Hospital Stay (HOSPITAL_COMMUNITY): Admit: 2023-11-19 | Admitting: Obstetrics & Gynecology

## 2023-11-24 ENCOUNTER — Ambulatory Visit

## 2023-12-01 ENCOUNTER — Encounter: Payer: Self-pay | Admitting: Family Medicine

## 2023-12-13 ENCOUNTER — Ambulatory Visit: Admitting: Family Medicine

## 2023-12-16 ENCOUNTER — Ambulatory Visit

## 2024-01-02 NOTE — Progress Notes (Unsigned)
 ***virtual visit***  Post Partum Visit Note  [name] Moreno is a 43 y.o. [phone] female who presents for a postpartum visit. She is 10 weeks postpartum following a repeat cesarean section.  I have fully reviewed the prenatal and intrapartum course. The delivery was at 36 gestational weeks.  Anesthesia: epidural. Postpartum course has been notable for elevated BP's, seen in [facility] once as well as anemia for which she was ordered for IV iron but no showed two visits. Baby is doing well. Baby is feeding by bottle. Bleeding no bleeding. Bowel function is normal. Bladder function is normal. Contraception method is s/p BTL. Postpartum depression screening: .   The pregnancy intention screening data noted above was reviewed. Potential methods of contraception were discussed. The patient elected to proceed with No data recorded.    Health Maintenance Due  Topic Date Due   COVID-19 Vaccine (1) Never done   Hepatitis B Vaccines 19-59 Average Risk (1 of 3 - 19+ 3-dose series) Never done   HPV VACCINES (1 - 3-dose SCDM series) Never done   Mammogram  Never done   Influenza Vaccine  11/18/2023    The following portions of the patient's history were reviewed and updated as appropriate: allergies, current medications, past family history, past medical history, past social history, past surgical history, and problem list.  Review of Systems Pertinent items noted in HPI and remainder of comprehensive ROS otherwise negative.  Objective:  There were no vitals taken for this visit.   General:  alert, cooperative, and appears stated age   Breasts:  not indicated  Lungs: Comfortalbe on room air             Assessment:   Postpartum exam  S/P cesarean section  Preterm delivery  Multinodular goiter  History of gestational diabetes  History of bilateral salpingectomy  Chronic posttraumatic stress disorder  Chronic midline thoracic back pain  Normal*** postpartum exam.   Plan:    Essential components of care per ACOG recommendations:  1.  Mood and well being: Patient did not have depression screening today, but plan to address at next visit. Reviewed local resources for support.  - Patient tobacco use? Yes. Patient desires to quit? No.   - hx of drug use? See below   ***reports some issues but prefers to discuss in person  2. Infant care and feeding:  -Patient currently breastmilk feeding? No.  -Social determinants of health (SDOH) reviewed in EPIC. No concerns  3. Sexuality, contraception and birth spacing - Patient does not want a pregnancy in the next year.  Desired family size is 3 children.  - Reviewed reproductive life planning. Reviewed contraceptive methods based on pt preferences and effectiveness.  Patient is s/p bilateral salpingectomy today.   - Discussed birth spacing of 18 months  4. Sleep and fatigue -Encouraged family/partner/community support of 4 hrs of uninterrupted sleep to help with mood and fatigue  5. Physical Recovery  - Discussed patients delivery and complications. She describes her labor as bad. - Patient had a C-section failure to progress.  - Patient has urinary incontinence? No. - Patient is safe to resume physical and sexual activity  6.  Health Maintenance - HM due items addressed Yes, needs colpo. Will reschedule for next visit, reports her aunt has ovarian cancer and she very much wants to get this rescheduled but needs AM visit, we will coordinate this.  - Last pap smear  Diagnosis  Date Value Ref Range Status  06/07/2023 - Low grade squamous  intraepithelial lesion (LSIL) (A)  Final   Pap smear not done at today's visit.  -Breast Cancer screening indicated? Yes. {Referral:25517}  7. Chronic Disease/Pregnancy Condition follow up:  1. Postpartum exam   2. S/P cesarean section   3. Preterm delivery   4. Multinodular goiter   5. History of gestational diabetes   6. History of bilateral salpingectomy   7. Chronic  posttraumatic stress disorder   8. Chronic midline thoracic back pain    1. Postpartum exam See above  2. S/P cesarean section   3. Preterm delivery At 36 wks in setting of PPROM  4. Multinodular goiter Repeat TSH at next visit  5. History of gestational diabetes Offered A1c at next visit  6. History of bilateral salpingectomy Benign path  7. Chronic posttraumatic stress disorder Has been referred to psychiatry multiple times in the past Discuss again at next in person visit  8. Chronic midline thoracic back pain Referred to pain management multiple times during pregnancy Discussed again today, agreed to discuss a plan in detail at next in prson visit  ***anemia, partner has third shift job, has a lot of trouble getting someone to watch her child during the day. Plan for hemocue next visit, and if still super low will make a plan for IV iron  - PCP follow up   [name] FORBES Ruddle, RN/MPH Attending Family Medicine Physician, [facility] for [facility], [facility]

## 2024-01-03 ENCOUNTER — Encounter: Payer: Self-pay | Admitting: Family Medicine

## 2024-01-03 ENCOUNTER — Telehealth (INDEPENDENT_AMBULATORY_CARE_PROVIDER_SITE_OTHER): Admitting: Family Medicine

## 2024-01-03 DIAGNOSIS — G8929 Other chronic pain: Secondary | ICD-10-CM | POA: Diagnosis not present

## 2024-01-03 DIAGNOSIS — Z9079 Acquired absence of other genital organ(s): Secondary | ICD-10-CM

## 2024-01-03 DIAGNOSIS — Z98891 History of uterine scar from previous surgery: Secondary | ICD-10-CM | POA: Diagnosis not present

## 2024-01-03 DIAGNOSIS — D649 Anemia, unspecified: Secondary | ICD-10-CM

## 2024-01-03 DIAGNOSIS — E042 Nontoxic multinodular goiter: Secondary | ICD-10-CM

## 2024-01-03 DIAGNOSIS — F4312 Post-traumatic stress disorder, chronic: Secondary | ICD-10-CM | POA: Diagnosis not present

## 2024-01-03 DIAGNOSIS — Z8632 Personal history of gestational diabetes: Secondary | ICD-10-CM | POA: Diagnosis not present

## 2024-01-03 DIAGNOSIS — M546 Pain in thoracic spine: Secondary | ICD-10-CM

## 2024-01-03 DIAGNOSIS — Z1231 Encounter for screening mammogram for malignant neoplasm of breast: Secondary | ICD-10-CM

## 2024-01-03 NOTE — Progress Notes (Unsigned)
 Met with [name] Moreno via televisit, ,confirming correct patient with 2 identifiers.  [name] Moreno with questions related to newborn feeding for her infant that is a former NICU patient.  She reports infant with constipation using powder formula and improvement with ready to feed but inability to obtain ready to feed through [facility].  She reports she is currently feeding infant Similac 360 Sensitive but notes increased mucous production in infant and intermittent refusal to feed.  We discussed prune juice 1 tsp up to 2 times per day for management of constipation.  We also discussed hypoallergenic formula and which are covered by [facility].  Encouraged her to contact her pediatrician if she does not see improvement in symptoms.  She has REACH consult contact information if additional needs arise.

## 2024-01-06 DIAGNOSIS — D649 Anemia, unspecified: Secondary | ICD-10-CM | POA: Insufficient documentation

## 2024-01-17 ENCOUNTER — Other Ambulatory Visit (HOSPITAL_BASED_OUTPATIENT_CLINIC_OR_DEPARTMENT_OTHER): Payer: Self-pay

## 2024-01-18 ENCOUNTER — Inpatient Hospital Stay (HOSPITAL_BASED_OUTPATIENT_CLINIC_OR_DEPARTMENT_OTHER): Admission: RE | Admit: 2024-01-18 | Source: Ambulatory Visit | Admitting: Radiology

## 2024-01-24 ENCOUNTER — Ambulatory Visit (INDEPENDENT_AMBULATORY_CARE_PROVIDER_SITE_OTHER): Admitting: Family Medicine

## 2024-01-24 ENCOUNTER — Other Ambulatory Visit (HOSPITAL_COMMUNITY)
Admission: RE | Admit: 2024-01-24 | Discharge: 2024-01-24 | Disposition: A | Discharge status: Home / Self Care | Source: Ambulatory Visit | Attending: Family Medicine | Admitting: Family Medicine

## 2024-01-24 ENCOUNTER — Encounter: Payer: Self-pay | Admitting: Family Medicine

## 2024-01-24 ENCOUNTER — Other Ambulatory Visit: Payer: Self-pay

## 2024-01-24 VITALS — BP 162/92 | HR 85 | Wt 177.5 lb

## 2024-01-24 DIAGNOSIS — R87612 Low grade squamous intraepithelial lesion on cytologic smear of cervix (LGSIL): Secondary | ICD-10-CM | POA: Diagnosis not present

## 2024-01-24 DIAGNOSIS — G8929 Other chronic pain: Secondary | ICD-10-CM | POA: Diagnosis not present

## 2024-01-24 DIAGNOSIS — B977 Papillomavirus as the cause of diseases classified elsewhere: Secondary | ICD-10-CM

## 2024-01-24 DIAGNOSIS — Z1331 Encounter for screening for depression: Secondary | ICD-10-CM | POA: Diagnosis not present

## 2024-01-24 DIAGNOSIS — I10 Essential (primary) hypertension: Secondary | ICD-10-CM | POA: Diagnosis not present

## 2024-01-24 DIAGNOSIS — Z3202 Encounter for pregnancy test, result negative: Secondary | ICD-10-CM

## 2024-01-24 DIAGNOSIS — E042 Nontoxic multinodular goiter: Secondary | ICD-10-CM | POA: Diagnosis not present

## 2024-01-24 DIAGNOSIS — R87810 Cervical high risk human papillomavirus (HPV) DNA test positive: Secondary | ICD-10-CM | POA: Diagnosis present

## 2024-01-24 DIAGNOSIS — M546 Pain in thoracic spine: Secondary | ICD-10-CM

## 2024-01-24 DIAGNOSIS — N870 Mild cervical dysplasia: Secondary | ICD-10-CM | POA: Diagnosis not present

## 2024-01-24 LAB — POCT PREGNANCY, URINE: Preg Test, Ur: NEGATIVE

## 2024-01-24 NOTE — Progress Notes (Deleted)
***  hemocue ***pain clinic, [facility] ***psychiatry

## 2024-01-24 NOTE — Progress Notes (Signed)
 GYNECOLOGY OFFICE VISIT NOTE  History:   [name] Moreno is a 43 y.o. [phone] here today for colposcopy, but with multiple other issues.  Patient reports significant stress because she's moving but new house isn't ready yet, living with a relative for the time being. Lots of issues related to this.  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  Suspect this was made worse due to prednisone taper.  Will start PPI at 40 mg twice daily for 2 weeks then 40 mg daily.  Consider referral to GI for no improvement or worsening signs and symptoms.     We did discuss dietary triggers and recommendations for controlling GERD     Headache    Heart murmur    Multinodular goiter    Thyroid  nodule     Past Surgical History:  Procedure Laterality Date   CESAREAN SECTION N/A 01/24/2018   Procedure: CESAREAN SECTION;  Surgeon: Nicholaus Burnard HERO, MD;  Location: [facility] BIRTHING SUITES;  Service: Obstetrics;  Laterality: N/A;   CESAREAN SECTION WITH BILATERAL TUBAL LIGATION Bilateral 10/29/2023   Procedure: CESAREAN SECTION, WITH BILATERAL TUBAL LIGATION;  Surgeon: Nicholaus Burnard HERO, MD;  Location: [facility] LD ORS;  Service: Obstetrics;  Laterality: Bilateral;   TUBAL LIGATION Bilateral 10/29/2023   Procedure: LIGATION, FALLOPIAN TUBE, BILATERAL;  Surgeon: Nicholaus Burnard HERO, MD;  Location: [facility] LD ORS;  Service:  Obstetrics;  Laterality: Bilateral;    The following portions of the patient's history were reviewed and updated as appropriate: allergies, current medications, past family history, past medical history, past social history, past surgical history and problem list.   Health Maintenance:   Last pap: Result Date Procedure Results Follow-ups  06/07/2023 Cytology - PAP( [place]) High risk HPV: Positive (A) Neisseria Gonorrhea: Negative Chlamydia: Negative HPV 16: Positive (A) HPV 18 / 45: Negative Adequacy: Satisfactory for evaluation; transformation zone component PRESENT. Diagnosis: - Low grade squamous intraepithelial lesion (LSIL) (A) Comment: Normal Reference Ranger Chlamydia - Negative Comment: Normal Reference Range Neisseria Gonorrhea - Negative Comment: Normal Reference Range HPV - Negative Comment: Normal Reference Range HPV 16- Negative Comment: Normal Reference Range HPV 16 18 45 -Negative   08/15/2017 Cytology - PAP Adequacy: Satisfactory for evaluation  endocervical/transformation zone component PRESENT. Diagnosis: NEGATIVE FOR INTRAEPITHELIAL LESIONS OR MALIGNANCY. Bacterial vaginitis: POSITIVE for Gardnerella vaginalis (A) Candida vaginitis: Negative for Candida species Chlamydia: Negative Neisseria Gonorrhea: Negative Trichomonas: Negative HPV: NOT DETECTED Material Submitted: CervicoVaginal Pap [ThinPrep Imaged] CYTOLOGY - PAP: PAP RESULT      Last mammogram:  Scheduled for mammo in a few days    Review of Systems:  Pertinent items noted in HPI and remainder of comprehensive ROS otherwise negative.  Physical Exam:  BP (!) 162/92   Pulse 85   Wt 177 lb 8 oz (80.5 kg)  BMI 30.47 kg/m  CONSTITUTIONAL: Well-developed, well-nourished female in no acute distress.  HEENT:  Normocephalic, atraumatic. External right and left ear normal. No scleral icterus.  NECK: Normal range of motion, supple, no masses noted on observation SKIN: mild hyperpigmentation in  lower breast folds, no significant erythema MUSCULOSKELETAL: Normal range of motion. No edema noted. NEUROLOGIC: Alert and oriented to person, place, and time. Normal muscle tone coordination.  PSYCHIATRIC: Normal mood and affect. Normal behavior. Normal judgment and thought content. RESPIRATORY: Effort normal, no problems with respiration noted  Labs and Imaging Results for orders placed or performed in visit on 01/24/24 (from the past week)  Pregnancy, urine POC   Collection Time: 01/24/24 11:39 AM  Result Value Ref Range   Preg Test, Ur NEGATIVE NEGATIVE   No results found.    Assessment and Plan:   Problem List Items Addressed This Visit       Cardiovascular and Mediastinum   Hypertension   Relevant Orders   Ambulatory Referral to Primary Care (Establish Care)     Endocrine   Multinodular goiter   Relevant Orders   Ambulatory Referral to Primary Care (Establish Care)     Other   Chronic midline thoracic back pain   Relevant Orders   Ambulatory Referral to Primary Care (Establish Care)   Other Visit Diagnoses       High risk HPV infection    -  Primary   Relevant Orders   Surgical pathology( [place]/ [facility])      #Skin No clear signs of yeast infection under breasts, but no harm with trial of OTC med like lotrimin or monistat.   #Hypertension #Chronic pain #Cough Referred for PCP care and emphasized that we are not equipped in OBGYN office to handle multitude of health issues that she has going on and the importance of long term longitudinal care.    Return for pending pathology results.    Total face-to-face time with patient: 10 minutes.  Over 50% of encounter was spent on counseling and coordination of care.   Donnice CHRISTELLA Carolus, MD/MPH Attending Family Medicine Physician, [facility] for [facility], [facility]

## 2024-01-24 NOTE — Progress Notes (Signed)
   Established Patient Office Visit  Subjective   Patient ID: [name] Moreno, female    DOB: [birth year]  Age: 43 y.o. MRN: [id number]  No chief complaint on file.   HPI    ROS    Objective:     There were no vitals taken for this visit.   Physical Exam   No results found for any visits on 01/24/24.    The ASCVD Risk score (Arnett DK, et al., 2019) failed to calculate for the following reasons:   Cannot find a previous HDL lab   Cannot find a previous total cholesterol lab    Assessment & Plan:   Problem List Items Addressed This Visit   None Visit Diagnoses       High risk HPV infection    -  Primary       Return for pending pathology results.    Cooper Pouch, RMA

## 2024-01-24 NOTE — Progress Notes (Signed)
    GYNECOLOGY CLINIC COLPOSCOPY PROCEDURE NOTE  43 y.o. [id number] here for colposcopy for pap finding of:     Component Value Date/Time   DIAGPAP - Low grade squamous intraepithelial lesion (LSIL) (A) 06/07/2023 1711   HPVHIGH Positive (A) 06/07/2023 1711   ADEQPAP  06/07/2023 1711    Satisfactory for evaluation; transformation zone component PRESENT.    Result Date Procedure Results Follow-ups  06/07/2023 Cytology - PAP( [place]) High risk HPV: Positive (A) Neisseria Gonorrhea: Negative Chlamydia: Negative HPV 16: Positive (A) HPV 18 / 45: Negative Adequacy: Satisfactory for evaluation; transformation zone component PRESENT. Diagnosis: - Low grade squamous intraepithelial lesion (LSIL) (A) Comment: Normal Reference Ranger Chlamydia - Negative Comment: Normal Reference Range Neisseria Gonorrhea - Negative Comment: Normal Reference Range HPV - Negative Comment: Normal Reference Range HPV 16- Negative Comment: Normal Reference Range HPV 16 18 45 -Negative   08/15/2017 Cytology - PAP Adequacy: Satisfactory for evaluation  endocervical/transformation zone component PRESENT. Diagnosis: NEGATIVE FOR INTRAEPITHELIAL LESIONS OR MALIGNANCY. Bacterial vaginitis: POSITIVE for Gardnerella vaginalis (A) Candida vaginitis: Negative for Candida species Chlamydia: Negative Neisseria Gonorrhea: Negative Trichomonas: Negative HPV: NOT DETECTED Material Submitted: CervicoVaginal Pap [ThinPrep Imaged] CYTOLOGY - PAP: PAP RESULT      Discussed role for HPV in cervical dysplasia, need for surveillance, nature of the procedure, and risks and benefits.  Pregnancy test: Lab Results  Component Value Date   PREGTESTUR NEGATIVE 12/07/2014    No Known Allergies  Patient given informed consent, signed copy in the chart, time out was performed.    Placed in lithotomy position. Cervix viewed with speculum and colposcope after application of acetic acid.   Colposcopy Adequacy Cervix fully  visualized: Yes  SCJ fully visualized: No , unable to visualize SCJ  Colposcopy Findings no visible lesions  Corresponding biopsies were not obtained.    ECC specimen was obtained.  All specimens were labeled and sent to pathology.  Hemostatic measures: None  Complications: none  Patient tolerated the procedure well.  OBGyn Exam  Colposcopy Impressions Normal  Plan Treatment plan pending biopsy results, per patient preference they will be communicated by telephone  Patient was given post procedure instructions.  Will follow up pathology and manage accordingly; patient will be contacted with results and recommendations.  Routine preventative health maintenance measures emphasized.  Time spent with patient doing procedure 5 minutes and total time with patient 10 minutes.   1. High risk HPV infection (Primary)  Donnice CHRISTELLA Carolus, MD

## 2024-01-24 NOTE — Patient Instructions (Signed)
 Colposcopy, Care After  The following information offers guidance on how to care for yourself after your procedure. Your doctor may also give you more specific instructions. If you have problems or questions, contact your doctor. What can I expect after the procedure? If you did not have a sample of your tissue taken out (did not have a biopsy), you may only have some spotting of blood for a few days. You can go back to your normal activities. If you had a sample of your tissue taken out, it is common to have: Soreness and mild pain. These may last for a few days. Mild bleeding or fluid (discharge) coming from your vagina. The fluid will look dark and grainy. You may have this for a few days. The fluid may be caused by a liquid that was used during your procedure. You may need to wear a sanitary pad. Spotting of blood for at least 48 hours after the procedure. Follow these instructions at home: Medicines Take over-the-counter and prescription medicines only as told by your doctor. Ask your doctor what over-the-counter pain medicines and prescription medicines you can start taking again. This is very important if you take blood thinners. Activity For at least 3 days, or for as long as told by your doctor, avoid: Douching. Using tampons. Having sex. Return to your normal activities as told by your doctor. Ask your doctor what activities are safe for you. General instructions Ask your doctor if you may take baths, swim, or use a hot tub. You may take showers. If you use birth control (contraception), keep using it. Keep all follow-up visits. Contact a doctor if: You have a fever or chills. You faint or feel light-headed. Get help right away if: You bleed a lot from your vagina. A lot of bleeding means that the bleeding soaks through a pad in less than 1 hour. You have clumps of blood (blood clots) coming from your vagina. You have signs that could mean you have an infection. This may be  fluid coming from your vagina that is: Different than normal. Yellow. Bad-smelling. You have very bad pain or cramps in your lower belly that do not get better with medicine. Summary If you did not have a sample of your tissue taken out, you may only have some spotting of blood for a few days. You can go back to your normal activities. If you had a sample of your tissue taken out, it is common to have mild pain for a few days and spotting for 48 hours. Avoid douching, using tampons, and having sex for at least 3 days after the procedure or for as long as told. Get help right away if you have a lot of bleeding, very bad pain, or signs of infection. This information is not intended to replace advice given to you by your health care provider. Make sure you discuss any questions you have with your health care provider. Document Revised: 08/31/2020 Document Reviewed: 08/31/2020 Elsevier Patient Education  2024 [employer].

## 2024-01-26 ENCOUNTER — Encounter (HOSPITAL_BASED_OUTPATIENT_CLINIC_OR_DEPARTMENT_OTHER): Payer: Self-pay | Admitting: Radiology

## 2024-01-26 ENCOUNTER — Ambulatory Visit (INDEPENDENT_AMBULATORY_CARE_PROVIDER_SITE_OTHER)
Admission: RE | Admit: 2024-01-26 | Discharge: 2024-01-26 | Disposition: A | Discharge status: Home / Self Care | Source: Ambulatory Visit | Attending: Family Medicine | Admitting: Family Medicine

## 2024-01-26 DIAGNOSIS — Z1231 Encounter for screening mammogram for malignant neoplasm of breast: Secondary | ICD-10-CM | POA: Diagnosis not present

## 2024-01-26 LAB — SURGICAL PATHOLOGY

## 2024-01-27 ENCOUNTER — Ambulatory Visit: Payer: Self-pay | Admitting: Family Medicine

## 2024-01-27 ENCOUNTER — Non-Acute Institutional Stay (HOSPITAL_COMMUNITY)
Admission: AD | Admit: 2024-01-27 | Discharge: 2024-01-27 | Disposition: A | Discharge status: Home / Self Care | Source: Ambulatory Visit | Attending: Internal Medicine | Admitting: Internal Medicine

## 2024-01-27 NOTE — Progress Notes (Signed)
 Called patient and confirmed identity with two markers. Discussed CIN1 pathology and recommendation for repeat cotesting in one year. All questions answered. Reminder message scheduled to ask front desk to schedule her for repeat pap next October.

## 2024-02-09 ENCOUNTER — Telehealth: Payer: Self-pay

## 2024-02-09 NOTE — Telephone Encounter (Signed)
 Copied from CRM [id number]. Topic: Referral - Question >> Jan 27, 2024 10:33 AM Ahlexyia S wrote: Reason for CRM: Pt stated the place/provider she was referred to by Dr. Lola doesn't have any availability until December 31st. Pt is wanting to know can someone find her a different provider so she can be seen sooner. Pt requested a call back when someone starts working on this.   Please call patient to see if she still needs an appt for primary care.

## 2024-02-16 DIAGNOSIS — M79662 Pain in left lower leg: Secondary | ICD-10-CM | POA: Diagnosis not present

## 2024-02-16 DIAGNOSIS — M5442 Lumbago with sciatica, left side: Secondary | ICD-10-CM | POA: Diagnosis not present

## 2024-02-16 DIAGNOSIS — E6609 Other obesity due to excess calories: Secondary | ICD-10-CM | POA: Diagnosis not present

## 2024-02-16 DIAGNOSIS — Z79899 Other long term (current) drug therapy: Secondary | ICD-10-CM | POA: Diagnosis not present

## 2024-02-16 DIAGNOSIS — G8929 Other chronic pain: Secondary | ICD-10-CM | POA: Diagnosis not present

## 2024-02-16 DIAGNOSIS — M7989 Other specified soft tissue disorders: Secondary | ICD-10-CM | POA: Diagnosis not present

## 2024-02-22 DIAGNOSIS — M5442 Lumbago with sciatica, left side: Secondary | ICD-10-CM | POA: Diagnosis not present

## 2024-02-22 DIAGNOSIS — M7989 Other specified soft tissue disorders: Secondary | ICD-10-CM | POA: Diagnosis not present

## 2024-02-22 DIAGNOSIS — M79662 Pain in left lower leg: Secondary | ICD-10-CM | POA: Diagnosis not present

## 2024-02-22 DIAGNOSIS — Z79899 Other long term (current) drug therapy: Secondary | ICD-10-CM | POA: Diagnosis not present

## 2024-02-22 DIAGNOSIS — E6609 Other obesity due to excess calories: Secondary | ICD-10-CM | POA: Diagnosis not present

## 2024-02-24 DIAGNOSIS — Z79899 Other long term (current) drug therapy: Secondary | ICD-10-CM | POA: Diagnosis not present

## 2024-02-28 ENCOUNTER — Ambulatory Visit: Admitting: Family Medicine

## 2024-02-29 DIAGNOSIS — E6609 Other obesity due to excess calories: Secondary | ICD-10-CM | POA: Diagnosis not present

## 2024-02-29 DIAGNOSIS — I10 Essential (primary) hypertension: Secondary | ICD-10-CM | POA: Diagnosis not present

## 2024-02-29 DIAGNOSIS — M5442 Lumbago with sciatica, left side: Secondary | ICD-10-CM | POA: Diagnosis not present

## 2024-02-29 DIAGNOSIS — M7989 Other specified soft tissue disorders: Secondary | ICD-10-CM | POA: Diagnosis not present

## 2024-02-29 DIAGNOSIS — Z79899 Other long term (current) drug therapy: Secondary | ICD-10-CM | POA: Diagnosis not present

## 2024-02-29 DIAGNOSIS — F411 Generalized anxiety disorder: Secondary | ICD-10-CM | POA: Diagnosis not present

## 2024-02-29 DIAGNOSIS — M79662 Pain in left lower leg: Secondary | ICD-10-CM | POA: Diagnosis not present

## 2024-03-07 ENCOUNTER — Encounter: Payer: Self-pay | Admitting: *Deleted

## 2024-03-08 DIAGNOSIS — Z79899 Other long term (current) drug therapy: Secondary | ICD-10-CM | POA: Diagnosis not present

## 2024-03-14 DIAGNOSIS — M79662 Pain in left lower leg: Secondary | ICD-10-CM | POA: Diagnosis not present

## 2024-03-14 DIAGNOSIS — I10 Essential (primary) hypertension: Secondary | ICD-10-CM | POA: Diagnosis not present

## 2024-03-14 DIAGNOSIS — M7989 Other specified soft tissue disorders: Secondary | ICD-10-CM | POA: Diagnosis not present

## 2024-03-14 DIAGNOSIS — M5442 Lumbago with sciatica, left side: Secondary | ICD-10-CM | POA: Diagnosis not present

## 2024-03-14 DIAGNOSIS — Z79899 Other long term (current) drug therapy: Secondary | ICD-10-CM | POA: Diagnosis not present

## 2024-03-14 DIAGNOSIS — Z6830 Body mass index (BMI) 30.0-30.9, adult: Secondary | ICD-10-CM | POA: Diagnosis not present

## 2024-04-08 DIAGNOSIS — Z79899 Other long term (current) drug therapy: Secondary | ICD-10-CM | POA: Diagnosis not present

## 2024-04-08 DIAGNOSIS — M5442 Lumbago with sciatica, left side: Secondary | ICD-10-CM | POA: Diagnosis not present

## 2024-04-08 DIAGNOSIS — M79662 Pain in left lower leg: Secondary | ICD-10-CM | POA: Diagnosis not present

## 2024-04-08 DIAGNOSIS — M7989 Other specified soft tissue disorders: Secondary | ICD-10-CM | POA: Diagnosis not present
